# Patient Record
Sex: Female | Born: 1950
Health system: Southern US, Community
[De-identification: ages and names within clinical notes are randomized; demographics above are authoritative.]

## PROBLEM LIST (undated history)

## (undated) DIAGNOSIS — C801 Malignant (primary) neoplasm, unspecified: Secondary | ICD-10-CM

## (undated) DIAGNOSIS — K219 Gastro-esophageal reflux disease without esophagitis: Secondary | ICD-10-CM

## (undated) DIAGNOSIS — E785 Hyperlipidemia, unspecified: Secondary | ICD-10-CM

## (undated) DIAGNOSIS — M8580 Other specified disorders of bone density and structure, unspecified site: Secondary | ICD-10-CM

## (undated) DIAGNOSIS — M810 Age-related osteoporosis without current pathological fracture: Secondary | ICD-10-CM

## (undated) HISTORY — DX: Hyperlipidemia, unspecified: E78.5

## (undated) HISTORY — DX: Other specified disorders of bone density and structure, unspecified site: M85.80

## (undated) HISTORY — PX: MASTECTOMY: SHX3

## (undated) HISTORY — DX: Gastro-esophageal reflux disease without esophagitis: K21.9

## (undated) HISTORY — PX: BREAST SURGERY: SHX581

## (undated) HISTORY — DX: Malignant (primary) neoplasm, unspecified: C80.1

## (undated) HISTORY — DX: Age-related osteoporosis without current pathological fracture: M81.0

## (undated) HISTORY — PX: OTHER SURGICAL HISTORY: SHX169

## (undated) HISTORY — PX: HERNIA REPAIR: SHX51

---

## 2002-09-29 ENCOUNTER — Other Ambulatory Visit: Admission: RE | Admit: 2002-09-29 | Discharge: 2002-09-29 | Payer: Self-pay | Admitting: Obstetrics and Gynecology

## 2003-05-22 ENCOUNTER — Encounter: Admission: RE | Admit: 2003-05-22 | Discharge: 2003-05-22 | Payer: Self-pay | Admitting: Family Medicine

## 2003-05-31 ENCOUNTER — Encounter: Admission: RE | Admit: 2003-05-31 | Discharge: 2003-05-31 | Payer: Self-pay | Admitting: Family Medicine

## 2003-06-07 ENCOUNTER — Encounter: Admission: RE | Admit: 2003-06-07 | Discharge: 2003-06-07 | Payer: Self-pay | Admitting: Family Medicine

## 2003-07-17 ENCOUNTER — Encounter: Admission: RE | Admit: 2003-07-17 | Discharge: 2003-07-17 | Payer: Self-pay | Admitting: Family Medicine

## 2003-10-30 ENCOUNTER — Other Ambulatory Visit: Admission: RE | Admit: 2003-10-30 | Discharge: 2003-10-30 | Payer: Self-pay | Admitting: Obstetrics and Gynecology

## 2004-10-31 ENCOUNTER — Other Ambulatory Visit: Admission: RE | Admit: 2004-10-31 | Discharge: 2004-10-31 | Payer: Self-pay | Admitting: Obstetrics and Gynecology

## 2005-11-04 ENCOUNTER — Ambulatory Visit: Payer: Self-pay | Admitting: Pulmonary Disease

## 2005-11-05 ENCOUNTER — Ambulatory Visit: Admission: RE | Admit: 2005-11-05 | Discharge: 2005-11-05 | Payer: Self-pay | Admitting: Pulmonary Disease

## 2005-12-02 ENCOUNTER — Ambulatory Visit: Payer: Self-pay | Admitting: Pulmonary Disease

## 2006-05-12 ENCOUNTER — Encounter: Admission: RE | Admit: 2006-05-12 | Discharge: 2006-05-12 | Payer: Self-pay | Admitting: Family Medicine

## 2010-02-24 ENCOUNTER — Encounter: Payer: Self-pay | Admitting: Family Medicine

## 2010-06-21 ENCOUNTER — Other Ambulatory Visit: Payer: Self-pay | Admitting: Obstetrics and Gynecology

## 2010-06-21 DIAGNOSIS — Z780 Asymptomatic menopausal state: Secondary | ICD-10-CM

## 2010-09-17 ENCOUNTER — Ambulatory Visit
Admission: RE | Admit: 2010-09-17 | Discharge: 2010-09-17 | Disposition: A | Discharge status: Home / Self Care | Payer: BC Managed Care – PPO | Source: Ambulatory Visit | Attending: Obstetrics and Gynecology | Admitting: Obstetrics and Gynecology

## 2010-09-17 DIAGNOSIS — Z780 Asymptomatic menopausal state: Secondary | ICD-10-CM

## 2011-07-28 ENCOUNTER — Other Ambulatory Visit: Payer: Self-pay | Admitting: Obstetrics and Gynecology

## 2011-07-28 DIAGNOSIS — R928 Other abnormal and inconclusive findings on diagnostic imaging of breast: Secondary | ICD-10-CM

## 2011-07-29 ENCOUNTER — Ambulatory Visit
Admission: RE | Admit: 2011-07-29 | Discharge: 2011-07-29 | Disposition: A | Discharge status: Home / Self Care | Payer: BC Managed Care – PPO | Source: Ambulatory Visit | Attending: Obstetrics and Gynecology | Admitting: Obstetrics and Gynecology

## 2011-07-29 DIAGNOSIS — R928 Other abnormal and inconclusive findings on diagnostic imaging of breast: Secondary | ICD-10-CM

## 2012-07-19 ENCOUNTER — Encounter: Payer: Self-pay | Admitting: Family Medicine

## 2012-07-21 ENCOUNTER — Other Ambulatory Visit: Payer: Self-pay | Admitting: Family Medicine

## 2012-07-21 DIAGNOSIS — Z0000 Encounter for general adult medical examination without abnormal findings: Secondary | ICD-10-CM

## 2012-07-22 ENCOUNTER — Other Ambulatory Visit (INDEPENDENT_AMBULATORY_CARE_PROVIDER_SITE_OTHER): Payer: BC Managed Care – PPO

## 2012-07-22 DIAGNOSIS — Z0000 Encounter for general adult medical examination without abnormal findings: Secondary | ICD-10-CM

## 2012-07-22 DIAGNOSIS — L659 Nonscarring hair loss, unspecified: Secondary | ICD-10-CM

## 2012-07-22 LAB — COMPREHENSIVE METABOLIC PANEL
ALT: 22 U/L (ref 0–35)
Albumin: 4.3 g/dL (ref 3.5–5.2)
Alkaline Phosphatase: 53 U/L (ref 39–117)
CO2: 27 mEq/L (ref 19–32)
Potassium: 5.1 mEq/L (ref 3.5–5.3)
Total Protein: 6.9 g/dL (ref 6.0–8.3)

## 2012-07-22 LAB — FERRITIN: Ferritin: 49 ng/mL (ref 10–291)

## 2012-07-22 LAB — LIPID PANEL
LDL Cholesterol: 121 mg/dL — ABNORMAL HIGH (ref 0–99)
VLDL: 33 mg/dL (ref 0–40)

## 2012-07-23 LAB — CBC WITH DIFFERENTIAL/PLATELET

## 2012-07-23 LAB — VITAMIN D 25 HYDROXY (VIT D DEFICIENCY, FRACTURES): Vit D, 25-Hydroxy: 59 ng/mL (ref 30–89)

## 2012-07-26 ENCOUNTER — Ambulatory Visit (INDEPENDENT_AMBULATORY_CARE_PROVIDER_SITE_OTHER): Payer: BC Managed Care – PPO | Admitting: Family Medicine

## 2012-07-26 ENCOUNTER — Encounter: Payer: Self-pay | Admitting: Family Medicine

## 2012-07-26 VITALS — BP 118/78 | HR 80 | Temp 98.0°F | Resp 16 | Ht 64.5 in | Wt 134.0 lb

## 2012-07-26 DIAGNOSIS — M899 Disorder of bone, unspecified: Secondary | ICD-10-CM

## 2012-07-26 DIAGNOSIS — Z1211 Encounter for screening for malignant neoplasm of colon: Secondary | ICD-10-CM

## 2012-07-26 DIAGNOSIS — M949 Disorder of cartilage, unspecified: Secondary | ICD-10-CM

## 2012-07-26 DIAGNOSIS — Z23 Encounter for immunization: Secondary | ICD-10-CM

## 2012-07-26 DIAGNOSIS — E785 Hyperlipidemia, unspecified: Secondary | ICD-10-CM | POA: Insufficient documentation

## 2012-07-26 DIAGNOSIS — Z2911 Encounter for prophylactic immunotherapy for respiratory syncytial virus (RSV): Secondary | ICD-10-CM

## 2012-07-26 DIAGNOSIS — Z0000 Encounter for general adult medical examination without abnormal findings: Secondary | ICD-10-CM

## 2012-07-26 DIAGNOSIS — M8580 Other specified disorders of bone density and structure, unspecified site: Secondary | ICD-10-CM | POA: Insufficient documentation

## 2012-07-26 LAB — CBC WITH DIFFERENTIAL/PLATELET
Basophils Absolute: 0.1 10*3/uL (ref 0.0–0.1)
Eosinophils Relative: 2 % (ref 0–5)
HCT: 41.1 % (ref 36.0–46.0)
Lymphocytes Relative: 34 % (ref 12–46)
Lymphs Abs: 1.5 10*3/uL (ref 0.7–4.0)
Monocytes Absolute: 0.3 10*3/uL (ref 0.1–1.0)
Monocytes Relative: 8 % (ref 3–12)
Neutro Abs: 2.4 10*3/uL (ref 1.7–7.7)
Neutrophils Relative %: 55 % (ref 43–77)
RDW: 12.6 % (ref 11.5–15.5)
WBC: 4.4 10*3/uL (ref 4.0–10.5)

## 2012-07-26 NOTE — Progress Notes (Signed)
Subjective:    Patient ID: Brittany House, female    DOB: [birth year], 62 y.o.   MRN: [id number]  HPI Patient's here today for complete physical exam.  She denies any complaints. She just saw her gynecologist. Her mammogram has been performed. Her Pap smear has been performed. She is due for her next DEXA scan for her osteopenia. Her last colonoscopy was in 2003 and so she is overdue for her colonoscopy.  Her last tetanus shot was in June 2013 and is up to date. She is due for the shingle shot. Past Medical History  Diagnosis Date  . Cancer     breast  . Hyperlipidemia   . GERD (gastroesophageal reflux disease)   . Osteopenia    No current outpatient prescriptions on file prior to visit.   No current facility-administered medications on file prior to visit.   Allergies  Allergen Reactions  . Penicillins Swelling and Rash   History   Social History  . Marital Status: Married    Spouse Name: N/A    Number of Children: N/A  . Years of Education: N/A   Occupational History  . Not on file.   Social History Main Topics  . Smoking status: Never Smoker   . Smokeless tobacco: Never Used  . Alcohol Use: Yes     Comment: Rare  . Drug Use: No  . Sexually Active: Yes     Comment: married to Casimiro Needle.  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   Other Topics Concern  . Not on file   Social History Narrative  . No narrative on file   Family History  Problem Relation Age of Onset  . Heart disease Neg Hx   . Cancer Neg Hx       Review of Systems  All other systems reviewed and are negative.       Objective:   Physical Exam  Vitals reviewed. Constitutional: She is oriented to person, place, and time. She appears well-developed and well-nourished. No distress.  HENT:  Head: Normocephalic and atraumatic.  Right Ear: External ear normal.  Left Ear: External ear normal.  Nose: Nose normal.  Mouth/Throat: Oropharynx is clear and moist. No oropharyngeal exudate.  Eyes: Conjunctivae and EOM are  normal. Pupils are equal, round, and reactive to light. Right eye exhibits no discharge. Left eye exhibits no discharge. No scleral icterus.  Neck: Normal range of motion. Neck supple. No JVD present. No tracheal deviation present. No thyromegaly present.  Cardiovascular: Normal rate, regular rhythm, normal heart sounds and intact distal pulses.  Exam reveals no gallop and no friction rub.   No murmur heard. Pulmonary/Chest: Effort normal and breath sounds normal. No stridor. No respiratory distress. She has no wheezes. She has no rales. She exhibits no tenderness.  Abdominal: Soft. Bowel sounds are normal. She exhibits no distension and no mass. There is no tenderness. There is no rebound and no guarding.  Musculoskeletal: Normal range of motion. She exhibits no edema and no tenderness.  Lymphadenopathy:    She has no cervical adenopathy.  Neurological: She is alert and oriented to person, place, and time. She has normal reflexes. She displays normal reflexes. No cranial nerve deficit. She exhibits normal muscle tone. Coordination normal.  Skin: Skin is warm and dry. No rash noted. She is not diaphoretic. No erythema. No pallor.  Psychiatric: She has a normal mood and affect. Her behavior is normal. Judgment and thought content normal.          Assessment & Plan:  1.  Osteopenia She is taking vitamin D 500 units per day and calcium 1200 mg per day.  Recheck DEXA scan. - DG Bone Density; Future  2. Routine general medical examination at a health care facility I reviewed all her labs with the patient. These are listed below. Her exam is completely normal. I deferred her pelvic exam, breast exam, Pap smear to her gynecologist. I recommended the shingle shot but the patient will call her insurance prior to receiving it to determine the cost. Lab on 07/22/2012  Component Date Value Range Status  . Cholesterol 07/22/2012 200  0 - 200 mg/dL Final   Comment: ATP III Classification:                                 < 200        mg/dL        Desirable                               200 - 239     mg/dL        Borderline High                               >= 240        mg/dL        High                             . Triglycerides 07/22/2012 166* <150 mg/dL Final  . HDL 81/19/1478 46  >39 mg/dL Final  . Total CHOL/HDL Ratio 07/22/2012 4.3   Final  . VLDL 07/22/2012 33  0 - 40 mg/dL Final  . LDL Cholesterol 07/22/2012 121* 0 - 99 mg/dL Final   Comment:                            Total Cholesterol/HDL Ratio:CHD Risk                                                 Coronary Heart Disease Risk Table                                                                 Men       Women                                   1/2 Average Risk              3.4        3.3                                       Average Risk  5.0        4.4                                    2X Average Risk              9.6        7.1                                    3X Average Risk             23.4       11.0                          Use the calculated Patient Ratio above and the CHD Risk table                           to determine the patient's CHD Risk.                          ATP III Classification (LDL):                                < 100        mg/dL         Optimal                               100 - 129     mg/dL         Near or Above Optimal                               130 - 159     mg/dL         Borderline High                               160 - 189     mg/dL         High                                > 190        mg/dL         Very High                             . WBC 07/22/2012 TEST NOT PERFORMED  4.0 - 10.5 K/uL Final   Specimen clotted, unable to perform test(s).  . RBC 07/22/2012 TEST NOT PERFORMED  3.87 - 5.11 MIL/uL Final  . Hemoglobin 07/22/2012 TEST NOT PERFORMED  12.0 - 15.0 g/dL Final  . HCT 16/11/9602 TEST NOT PERFORMED  36.0 - 46.0 % Final  . MCV 07/22/2012 TEST NOT PERFORMED   78.0 - 100.0 fL Final  . [facility] 07/22/2012 TEST NOT PERFORMED  26.0 - 34.0 pg Final  . MCHC 07/22/2012 TEST NOT PERFORMED  30.0 - 36.0 g/dL Final  . RDW  07/22/2012 TEST NOT PERFORMED  11.5 - 15.5 % Final  . Platelets 07/22/2012 TEST NOT PERFORMED  150 - 400 K/uL Final  . Neutrophils Relative % 07/22/2012 TEST NOT PERFORMED  43 - 77 % Final  . Neutro Abs 07/22/2012 TEST NOT PERFORMED  1.7 - 7.7 K/uL Final  . Lymphocytes Relative 07/22/2012 TEST NOT PERFORMED  12 - 46 % Final  . Lymphs Abs 07/22/2012 TEST NOT PERFORMED  0.7 - 4.0 K/uL Final  . Monocytes Relative 07/22/2012 TEST NOT PERFORMED  3 - 12 % Final  . Monocytes Absolute 07/22/2012 TEST NOT PERFORMED  0.1 - 1.0 K/uL Final  . Eosinophils Relative 07/22/2012 TEST NOT PERFORMED  0 - 5 % Final  . Eosinophils Absolute 07/22/2012 TEST NOT PERFORMED  0.0 - 0.7 K/uL Final  . Basophils Relative 07/22/2012 TEST NOT PERFORMED  0 - 1 % Final  . Basophils Absolute 07/22/2012 TEST NOT PERFORMED  0.0 - 0.1 K/uL Final  . Smear Review 07/22/2012 TEST NOT PERFORMED   Final  . Sodium 07/22/2012 141  135 - 145 mEq/L Final  . Potassium 07/22/2012 5.1  3.5 - 5.3 mEq/L Final  . Chloride 07/22/2012 105  96 - 112 mEq/L Final  . CO2 07/22/2012 27  19 - 32 mEq/L Final  . Glucose, Bld 07/22/2012 87  70 - 99 mg/dL Final  . BUN 16/11/9602 16  6 - 23 mg/dL Final  . Creat 54/10/8117 0.77  0.50 - 1.10 mg/dL Final  . Total Bilirubin 07/22/2012 0.9  0.3 - 1.2 mg/dL Final  . Alkaline Phosphatase 07/22/2012 53  39 - 117 U/L Final  . AST 07/22/2012 19  0 - 37 U/L Final  . ALT 07/22/2012 22  0 - 35 U/L Final  . Total Protein 07/22/2012 6.9  6.0 - 8.3 g/dL Final  . Albumin 14/78/2956 4.3  3.5 - 5.2 g/dL Final  . Calcium 21/30/8657 9.7  8.4 - 10.5 mg/dL Final  . Vit D, 84-ONGEXBM 07/22/2012 59  30 - 89 ng/mL Final   Comment: This assay accurately quantifies Vitamin D, which is the sum of the                          25-Hydroxy forms of Vitamin D2 and D3.  Studies have  shown that the                          optimum concentration of 25-Hydroxy Vitamin D is 30 ng/mL or higher.                           Concentrations of Vitamin D between 20 and 29 ng/mL are considered to                          be insufficient and concentrations less than 20 ng/mL are considered                          to be deficient for Vitamin D.  . TSH 07/22/2012 1.578  0.350 - 4.500 uIU/mL Final  . Ferritin 07/22/2012 49  10 - 291 ng/mL Final  . Iron 07/22/2012 137  42 - 145 ug/dL Final    - CBC with Differential  3. Screen for colon cancer Consult GI for a screening colonoscopy. - Ambulatory referral to Gastroenterology

## 2012-09-01 ENCOUNTER — Telehealth: Payer: Self-pay | Admitting: Family Medicine

## 2012-09-01 DIAGNOSIS — Z1211 Encounter for screening for malignant neoplasm of colon: Secondary | ICD-10-CM

## 2012-09-01 NOTE — Telephone Encounter (Signed)
Change in Provider for Colonoscopy

## 2012-09-06 ENCOUNTER — Other Ambulatory Visit: Payer: Self-pay | Admitting: Obstetrics and Gynecology

## 2012-09-06 DIAGNOSIS — N951 Menopausal and female climacteric states: Secondary | ICD-10-CM

## 2012-09-15 ENCOUNTER — Other Ambulatory Visit: Payer: BC Managed Care – PPO

## 2012-09-22 ENCOUNTER — Ambulatory Visit
Admission: RE | Admit: 2012-09-22 | Discharge: 2012-09-22 | Disposition: A | Discharge status: Home / Self Care | Source: Ambulatory Visit | Attending: Obstetrics and Gynecology | Admitting: Obstetrics and Gynecology

## 2012-09-22 DIAGNOSIS — N951 Menopausal and female climacteric states: Secondary | ICD-10-CM

## 2012-10-15 ENCOUNTER — Other Ambulatory Visit: Payer: Self-pay | Admitting: Obstetrics and Gynecology

## 2012-10-15 DIAGNOSIS — Z9012 Acquired absence of left breast and nipple: Secondary | ICD-10-CM

## 2012-10-15 DIAGNOSIS — N644 Mastodynia: Secondary | ICD-10-CM

## 2012-10-19 ENCOUNTER — Ambulatory Visit
Admission: RE | Admit: 2012-10-19 | Discharge: 2012-10-19 | Disposition: A | Discharge status: Home / Self Care | Payer: BC Managed Care – PPO | Source: Ambulatory Visit | Attending: Obstetrics and Gynecology | Admitting: Obstetrics and Gynecology

## 2012-10-19 DIAGNOSIS — N644 Mastodynia: Secondary | ICD-10-CM

## 2012-10-19 DIAGNOSIS — Z9012 Acquired absence of left breast and nipple: Secondary | ICD-10-CM

## 2012-10-19 LAB — HM MAMMOGRAPHY

## 2012-11-16 ENCOUNTER — Ambulatory Visit (INDEPENDENT_AMBULATORY_CARE_PROVIDER_SITE_OTHER): Payer: BC Managed Care – PPO | Admitting: Family Medicine

## 2012-11-16 DIAGNOSIS — Z23 Encounter for immunization: Secondary | ICD-10-CM

## 2012-11-23 ENCOUNTER — Encounter: Payer: Self-pay | Admitting: Family Medicine

## 2012-11-23 ENCOUNTER — Ambulatory Visit (INDEPENDENT_AMBULATORY_CARE_PROVIDER_SITE_OTHER): Payer: BC Managed Care – PPO | Admitting: Family Medicine

## 2012-11-23 VITALS — BP 118/80 | HR 76 | Temp 98.2°F | Resp 18 | Ht 65.0 in | Wt 136.0 lb

## 2012-11-23 DIAGNOSIS — M810 Age-related osteoporosis without current pathological fracture: Secondary | ICD-10-CM

## 2012-11-23 NOTE — Progress Notes (Signed)
  Subjective:    Patient ID: Brittany House, female    DOB: 1950/04/01, 62 y.o.   MRN: [id number]  HPI Patient recently had a bone density that showed a T score of -2.7 in the lumbar spine and a T score of -2.3 and the left femur. She is recommended to treat osteoporosis. She is here for a second opinion. She's tried Boniva and Actonel in the past but had bone pain related to that. Therefore she is not interested in trying bisphosphonate again. She is never been on prolia.  She does have a history of breast cancer. Past Medical History  Diagnosis Date  . Cancer     breast  . Hyperlipidemia   . GERD (gastroesophageal reflux disease)   . Osteopenia   . Osteoporosis    No current outpatient prescriptions on file prior to visit.   No current facility-administered medications on file prior to visit.   Allergies  Allergen Reactions  . Penicillins Swelling and Rash   History   Social History  . Marital Status: Married    Spouse Name: N/A    Number of Children: N/A  . Years of Education: N/A   Occupational History  . Not on file.   Social History Main Topics  . Smoking status: Never Smoker   . Smokeless tobacco: Never Used  . Alcohol Use: Yes     Comment: Rare  . Drug Use: No  . Sexual Activity: Yes     Comment: married to Casimiro Needle.  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   Other Topics Concern  . Not on file   Social History Narrative  . No narrative on file      Review of Systems  All other systems reviewed and are negative.       Objective:   Physical Exam  Vitals reviewed. Cardiovascular: Normal rate, regular rhythm, normal heart sounds and intact distal pulses.   No murmur heard. Pulmonary/Chest: Effort normal and breath sounds normal. No respiratory distress. She has no wheezes. She has no rales.          Assessment & Plan:  1. Osteoporosis, unspecified Given her history of breast cancer, I think Evista 60 mg by mouth daily is an excellent choice for her osteoporosis. I will  recheck a bone density test in 2 years. If continuing to decline we could consider prolia injections.  I did discuss the risk of CVA on Evista and patient understands the risk and will continue with therapy.   Both external auditory canals and tympanic membranes appear normal today. Her symptoms are most consistent with eustachian tube dysfunction. She reports occasionally her ear still clogged like there's a pressure behind. There is no evidence of infection today or an effusion.Marland Kitchen

## 2013-03-15 ENCOUNTER — Ambulatory Visit (INDEPENDENT_AMBULATORY_CARE_PROVIDER_SITE_OTHER): Payer: BC Managed Care – PPO | Admitting: Family Medicine

## 2013-03-15 ENCOUNTER — Encounter: Payer: Self-pay | Admitting: Family Medicine

## 2013-03-15 VITALS — BP 104/68 | HR 80 | Temp 97.4°F | Resp 16 | Ht 64.5 in | Wt 137.0 lb

## 2013-03-15 DIAGNOSIS — J019 Acute sinusitis, unspecified: Secondary | ICD-10-CM

## 2013-03-15 MED ORDER — LEVOFLOXACIN 750 MG PO TABS: 750.0000 mg | ORAL_TABLET | Freq: Every day | ORAL | Status: DC

## 2013-03-15 MED ORDER — GUAIFENESIN-CODEINE 100-10 MG/5ML PO SYRP: 5.0000 mL | ORAL_SOLUTION | Freq: Three times a day (TID) | ORAL | Status: DC | PRN

## 2013-03-15 NOTE — Progress Notes (Signed)
Subjective:    Patient ID: Brittany House, female    DOB: 02-16-1950, 63 y.o.   MRN: [id number]  HPI One week ago the patient developed a severe sore throat and high fever. I was on call that weekend and I called the patient out a Z-Pak because she was concerned she may have been exposed to strep throat. This resolve after 2 days. The fever resolved after 2 days. However the patient has had constant postnasal drip ever since and severe hacking nonproductive cough. Her biggest concern today is postnasal drip and constant nonproductive cough that she describes as a tickle in her throat. Past Medical History  Diagnosis Date  . Cancer     breast  . Hyperlipidemia   . GERD (gastroesophageal reflux disease)   . Osteopenia   . Osteoporosis    Current Outpatient Prescriptions on File Prior to Visit  Medication Sig Dispense Refill  . Biotin 2500 MCG CAPS Take 1 tablet by mouth daily.      . calcium carbonate (OS-CAL) 600 MG TABS tablet Take 600 mg by mouth 2 (two) times daily with a meal.      . Multiple Vitamin (MULTIVITAMIN) tablet Take 1 tablet by mouth daily.      Marland Kitchen OVER THE COUNTER MEDICATION Take 1 tablet by mouth daily. Vitamin D 500iu      . raloxifene (EVISTA) 60 MG tablet Take 1 tablet by mouth daily.       No current facility-administered medications on file prior to visit.   Allergies  Allergen Reactions  . Penicillins Swelling and Rash   History   Social History  . Marital Status: Married    Spouse Name: N/A    Number of Children: N/A  . Years of Education: N/A   Occupational History  . Not on file.   Social History Main Topics  . Smoking status: Never Smoker   . Smokeless tobacco: Never Used  . Alcohol Use: Yes     Comment: Rare  . Drug Use: No  . 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   Other Topics Concern  . Not on file   Social History Narrative  . No narrative on file      Review of Systems  All other systems  reviewed and are negative.       Objective:   Physical Exam  Vitals reviewed. Constitutional: She appears well-developed and well-nourished.  HENT:  Right Ear: Tympanic membrane, external ear and ear canal normal.  Left Ear: Tympanic membrane, external ear and ear canal normal.  Nose: Mucosal edema and rhinorrhea present. Right sinus exhibits no maxillary sinus tenderness and no frontal sinus tenderness. Left sinus exhibits no maxillary sinus tenderness and no frontal sinus tenderness.  Mouth/Throat: Oropharynx is clear and moist. No oropharyngeal exudate.  Neck: Neck supple.  Cardiovascular: Normal rate, regular rhythm and normal heart sounds.   No murmur heard. Pulmonary/Chest: Effort normal and breath sounds normal. No respiratory distress. She has no wheezes. She has no rales.  Lymphadenopathy:    She has no cervical adenopathy.   5 mm well-circumscribed yellow cyst on the right peritonsillar fold which is chronic and unchanged        Assessment & Plan:  1. Acute rhinosinusitis I believe her cough is due to postnasal drip from a viral upper respiratory tract infection/sinusitis. I do not believe this is bacterial sinusitis. I gave the patient prescription for Levaquin but instructed her not to  fill the prescription unless she develops severe pain in her sinuses or high fever. I recommended Sudafed as needed for congestion. Recommended Claritin as needed for congestion. I also gave the patient Robitussin-AC as needed for cough. - levofloxacin (LEVAQUIN) 750 MG tablet; Take 1 tablet (750 mg total) by mouth daily.  Dispense: 7 tablet; Refill: 0 - guaiFENesin-codeine (ROBITUSSIN AC) 100-10 MG/5ML syrup; Take 5 mLs by mouth 3 (three) times daily as needed for cough.  Dispense: 120 mL; Refill: 0

## 2013-04-21 ENCOUNTER — Ambulatory Visit (INDEPENDENT_AMBULATORY_CARE_PROVIDER_SITE_OTHER): Payer: BC Managed Care – PPO | Admitting: Family Medicine

## 2013-04-21 ENCOUNTER — Encounter: Payer: Self-pay | Admitting: Family Medicine

## 2013-04-21 ENCOUNTER — Other Ambulatory Visit: Payer: Self-pay | Admitting: Family Medicine

## 2013-04-21 VITALS — BP 110/70 | HR 78 | Temp 98.4°F | Resp 14 | Ht 64.5 in | Wt 137.0 lb

## 2013-04-21 DIAGNOSIS — R059 Cough, unspecified: Secondary | ICD-10-CM

## 2013-04-21 DIAGNOSIS — R05 Cough: Secondary | ICD-10-CM

## 2013-04-21 DIAGNOSIS — K219 Gastro-esophageal reflux disease without esophagitis: Secondary | ICD-10-CM

## 2013-04-21 DIAGNOSIS — J019 Acute sinusitis, unspecified: Secondary | ICD-10-CM

## 2013-04-21 MED ORDER — PANTOPRAZOLE SODIUM 40 MG PO TBEC: 40.0000 mg | DELAYED_RELEASE_TABLET | Freq: Every day | ORAL | Status: DC

## 2013-04-21 MED ORDER — GUAIFENESIN-CODEINE 100-10 MG/5ML PO SYRP: 5.0000 mL | ORAL_SOLUTION | Freq: Three times a day (TID) | ORAL | Status: DC | PRN

## 2013-04-21 NOTE — Telephone Encounter (Signed)
Refill appropriate and filled per protocol. 

## 2013-04-21 NOTE — Progress Notes (Signed)
   Subjective:    Patient ID: Brittany House, female    DOB: July 27, 1950, 63 y.o.   MRN: [id number]  HPI  Patient continues to have a problem with an intermittent cough. The cough is nonproductive. She reports daily postnasal drip and rhinorrhea and allergies. She is also reporting daily reflux. She has tried and failed Zantac, Pepcid, and Zegerid over-the-counter without relief. She has tried prescription Nexium in the past but that did not work very well for her either. She is here today for evaluation. She denies any fevers chills hemoptysis weight loss or night sweats. Past Medical History  Diagnosis Date  . Cancer     breast  . Hyperlipidemia   . GERD (gastroesophageal reflux disease)   . Osteopenia   . Osteoporosis    Current Outpatient Prescriptions on File Prior to Visit  Medication Sig Dispense Refill  . Biotin 2500 MCG CAPS Take 1 tablet by mouth daily.      . calcium carbonate (OS-CAL) 600 MG TABS tablet Take 600 mg by mouth 2 (two) times daily with a meal.      . Multiple Vitamin (MULTIVITAMIN) tablet Take 1 tablet by mouth daily.      Marland Kitchen OVER THE COUNTER MEDICATION Take 1 tablet by mouth daily. Vitamin D 500iu      . raloxifene (EVISTA) 60 MG tablet Take 1 tablet by mouth daily.       No current facility-administered medications on file prior to visit.   Allergies  Allergen Reactions  . Penicillins Swelling and Rash   History   Social History  . Marital Status: Married    Spouse Name: N/A    Number of Children: N/A  . Years of Education: N/A   Occupational History  . Not on file.   Social History Main Topics  . Smoking status: Never Smoker   . Smokeless tobacco: Never Used  . Alcohol Use: Yes     Comment: Rare  . Drug Use: No  . 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   Other Topics Concern  . Not on file   Social History Narrative  . No narrative on file     Review of Systems  All other systems reviewed and are  negative.       Objective:   Physical Exam  Vitals reviewed. Constitutional: She appears well-developed and well-nourished.  HENT:  Right Ear: External ear normal.  Left Ear: External ear normal.  Nose: Nose normal.  Mouth/Throat: Oropharynx is clear and moist. No oropharyngeal exudate.  Eyes: Conjunctivae are normal. No scleral icterus.  Neck: Neck supple. No JVD present. No thyromegaly present.  Cardiovascular: Normal rate, regular rhythm and normal heart sounds.  Exam reveals no gallop.   No murmur heard. Pulmonary/Chest: Effort normal and breath sounds normal. No respiratory distress. She has no wheezes. She has no rales. She exhibits no tenderness.  Lymphadenopathy:    She has no cervical adenopathy.          Assessment & Plan:  GERD (gastroesophageal reflux disease) - Plan: pantoprazole (PROTONIX) 40 MG tablet  Acute rhinosinusitis - Plan: guaiFENesin-codeine (ROBITUSSIN AC) 100-10 MG/5ML syrup  Cough  I believe the patient has upper airway cough syndrome due to a variety of factors. I will have her start Veramyst 2 sprays each nostril daily, Claritin 10 mg by mouth daily, and protonix 40 mg by mouth daily. Recheck cough in 3 weeks

## 2013-04-25 ENCOUNTER — Telehealth: Payer: Self-pay | Admitting: Family Medicine

## 2013-04-25 MED ORDER — LEVOFLOXACIN 500 MG PO TABS: 500.0000 mg | ORAL_TABLET | Freq: Every day | ORAL | Status: DC

## 2013-04-25 NOTE — Telephone Encounter (Signed)
Sounds like sinus infection, and began Levaquin 500 mg by mouth daily for 7 days.

## 2013-04-25 NOTE — Telephone Encounter (Signed)
Patient aware and med sent to pharm 

## 2013-04-25 NOTE — Telephone Encounter (Signed)
Call back number is [phone] Pt states that her cough is still broken, and now it has all went to her face and it is hurting Her acid reflux is better  Pharmacy [facility] --before calling anything in please call pt

## 2013-04-27 ENCOUNTER — Telehealth: Payer: Self-pay | Admitting: Family Medicine

## 2013-04-27 NOTE — Telephone Encounter (Signed)
PA submitted through [email or web address] / [email or web address]

## 2013-05-02 ENCOUNTER — Telehealth: Payer: Self-pay | Admitting: Family Medicine

## 2013-05-02 MED ORDER — PREDNISONE 20 MG PO TABS: ORAL_TABLET | ORAL | Status: DC

## 2013-05-02 NOTE — Telephone Encounter (Signed)
Patient aware and med sent to pharm 

## 2013-05-02 NOTE — Telephone Encounter (Signed)
Message copied by Alyson Locket on Mon May 02, 2013 10:35 AM ------      Message from: Lenore Manner      Created: Mon May 02, 2013  8:37 AM      Regarding: not completly better      Contact: [phone]       Pt is getting way better, but the Clairtin is not helping.             What is something else she can use for decongestion, she still has broken cough some not like it was and her sinus are still dripping.  ------

## 2013-05-02 NOTE — Telephone Encounter (Signed)
Begin prednisone taper pack.

## 2013-05-02 NOTE — Telephone Encounter (Signed)
What else would you recommend for her?

## 2013-07-21 ENCOUNTER — Other Ambulatory Visit: Payer: BC Managed Care – PPO

## 2013-07-21 DIAGNOSIS — E785 Hyperlipidemia, unspecified: Secondary | ICD-10-CM

## 2013-07-21 DIAGNOSIS — Z0000 Encounter for general adult medical examination without abnormal findings: Secondary | ICD-10-CM

## 2013-07-21 LAB — LIPID PANEL
Cholesterol: 181 mg/dL (ref 0–200)
HDL: 54 mg/dL (ref >39)
LDL CALC: 100 mg/dL — AB (ref 0–99)
TRIGLYCERIDES: 136 mg/dL (ref <150)
Total CHOL/HDL Ratio: 3.4 Ratio
VLDL: 27 mg/dL (ref 0–40)

## 2013-07-21 LAB — COMPLETE METABOLIC PANEL WITH GFR
ALT: 21 U/L (ref 0–35)
AST: 19 U/L (ref 0–37)
Albumin: 4.1 g/dL (ref 3.5–5.2)
Alkaline Phosphatase: 55 U/L (ref 39–117)
BUN: 18 mg/dL (ref 6–23)
CO2: 26 mEq/L (ref 19–32)
CREATININE: 0.74 mg/dL (ref 0.50–1.10)
Calcium: 9 mg/dL (ref 8.4–10.5)
Chloride: 106 mEq/L (ref 96–112)
GFR, Est African American: >89 mL/min
GFR, Est Non African American: 88 mL/min
Glucose, Bld: 91 mg/dL (ref 70–99)
POTASSIUM: 4.6 meq/L (ref 3.5–5.3)
Sodium: 141 mEq/L (ref 135–145)
Total Bilirubin: 0.7 mg/dL (ref 0.2–1.2)
Total Protein: 6.2 g/dL (ref 6.0–8.3)

## 2013-07-21 LAB — CBC WITH DIFFERENTIAL/PLATELET
Basophils Absolute: 0 10*3/uL (ref 0.0–0.1)
Basophils Relative: 1 % (ref 0–1)
Eosinophils Absolute: 0.2 10*3/uL (ref 0.0–0.7)
Eosinophils Relative: 4 % (ref 0–5)
HCT: 40.9 % (ref 36.0–46.0)
Hemoglobin: 13.8 g/dL (ref 12.0–15.0)
LYMPHS PCT: 38 % (ref 12–46)
Lymphs Abs: 1.7 10*3/uL (ref 0.7–4.0)
MCH: 31.3 pg (ref 26.0–34.0)
MCHC: 33.7 g/dL (ref 30.0–36.0)
MCV: 92.7 fL (ref 78.0–100.0)
Monocytes Absolute: 0.4 10*3/uL (ref 0.1–1.0)
Monocytes Relative: 8 % (ref 3–12)
NEUTROS ABS: 2.3 10*3/uL (ref 1.7–7.7)
NEUTROS PCT: 49 % (ref 43–77)
PLATELETS: 216 10*3/uL (ref 150–400)
RBC: 4.41 MIL/uL (ref 3.87–5.11)
RDW: 12.9 % (ref 11.5–15.5)
WBC: 4.6 10*3/uL (ref 4.0–10.5)

## 2013-07-21 LAB — TSH: TSH: 2.549 u[IU]/mL (ref 0.350–4.500)

## 2013-07-21 NOTE — Telephone Encounter (Signed)
Spoke to pt and she states that she never got it filled as she did not need it and that she has [employer] primary so if she needs it she will call and we will redo a PA to [facility]

## 2013-07-28 ENCOUNTER — Encounter: Payer: Self-pay | Admitting: Family Medicine

## 2013-07-28 ENCOUNTER — Ambulatory Visit (INDEPENDENT_AMBULATORY_CARE_PROVIDER_SITE_OTHER): Payer: BC Managed Care – PPO | Admitting: Family Medicine

## 2013-07-28 VITALS — BP 100/62 | HR 78 | Temp 97.3°F | Resp 16 | Ht 64.5 in | Wt 136.0 lb

## 2013-07-28 DIAGNOSIS — Z0000 Encounter for general adult medical examination without abnormal findings: Secondary | ICD-10-CM

## 2013-07-28 MED ORDER — SCOPOLAMINE 1 MG/3DAYS TD PT72: 1.0000 | MEDICATED_PATCH | TRANSDERMAL | Status: DC

## 2013-07-28 NOTE — Progress Notes (Signed)
Subjective:    Patient ID: Brittany House, female    DOB: Oct 19, 1950, 63 y.o.   MRN: [id number]  HPI Patient's here today for complete physical exam. She sees a gynecologist to perform her Pap smear as well as her mammogram earlier this year. She has not received results of that yet. Her colonoscopy was performed last year. She had one polyp. Her next colonoscopy is due in 4 years (q 5 years).  Her bone density is not due until next year. She does have osteoporosis treated with Evista. She has an intolerance to bisphosphonates.  Patient has no underlying medical concerns. Her immunizations are up-to-date. Tetanus shot was in 2013. She has had Zostavax.  She is not due for pneumonia vaccines until age 74. Lab on 07/21/2013  Component Date Value Ref Range Status  . WBC 07/21/2013 4.6  4.0 - 10.5 K/uL Final  . RBC 07/21/2013 4.41  3.87 - 5.11 MIL/uL Final  . Hemoglobin 07/21/2013 13.8  12.0 - 15.0 g/dL Final  . HCT 07/21/2013 40.9  36.0 - 46.0 % Final  . MCV 07/21/2013 92.7  78.0 - 100.0 fL Final  . MCH 07/21/2013 31.3  26.0 - 34.0 pg Final  . MCHC 07/21/2013 33.7  30.0 - 36.0 g/dL Final  . RDW 07/21/2013 12.9  11.5 - 15.5 % Final  . Platelets 07/21/2013 216  150 - 400 K/uL Final  . Neutrophils Relative % 07/21/2013 49  43 - 77 % Final  . Neutro Abs 07/21/2013 2.3  1.7 - 7.7 K/uL Final  . Lymphocytes Relative 07/21/2013 38  12 - 46 % Final  . Lymphs Abs 07/21/2013 1.7  0.7 - 4.0 K/uL Final  . Monocytes Relative 07/21/2013 8  3 - 12 % Final  . Monocytes Absolute 07/21/2013 0.4  0.1 - 1.0 K/uL Final  . Eosinophils Relative 07/21/2013 4  0 - 5 % Final  . Eosinophils Absolute 07/21/2013 0.2  0.0 - 0.7 K/uL Final  . Basophils Relative 07/21/2013 1  0 - 1 % Final  . Basophils Absolute 07/21/2013 0.0  0.0 - 0.1 K/uL Final  . Smear Review 07/21/2013 Criteria for review not met   Final  . TSH 07/21/2013 2.549  0.350 - 4.500 uIU/mL Final  . Sodium 07/21/2013 141  135 - 145 mEq/L Final  . Potassium  07/21/2013 4.6  3.5 - 5.3 mEq/L Final  . Chloride 07/21/2013 106  96 - 112 mEq/L Final  . CO2 07/21/2013 26  19 - 32 mEq/L Final  . Glucose, Bld 07/21/2013 91  70 - 99 mg/dL Final  . BUN 07/21/2013 18  6 - 23 mg/dL Final  . Creat 07/21/2013 0.74  0.50 - 1.10 mg/dL Final  . Total Bilirubin 07/21/2013 0.7  0.2 - 1.2 mg/dL Final  . Alkaline Phosphatase 07/21/2013 55  39 - 117 U/L Final  . AST 07/21/2013 19  0 - 37 U/L Final  . ALT 07/21/2013 21  0 - 35 U/L Final  . Total Protein 07/21/2013 6.2  6.0 - 8.3 g/dL Final  . Albumin 07/21/2013 4.1  3.5 - 5.2 g/dL Final  . Calcium 07/21/2013 9.0  8.4 - 10.5 mg/dL Final  . GFR, Est African American 07/21/2013 >89   Final  . GFR, Est Non African American 07/21/2013 88   Final   Comment:                            The estimated GFR is  a calculation valid for adults (>=71 years old)                          that uses the CKD-EPI algorithm to adjust for age and sex. It is                            not to be used for children, pregnant women, hospitalized patients,                             patients on dialysis, or with rapidly changing kidney function.                          According to the NKDEP, eGFR >89 is normal, 60-89 shows mild                          impairment, 30-59 shows moderate impairment, 15-29 shows severe                          impairment and <15 is ESRD.                             Marland Kitchen Cholesterol 07/21/2013 181  0 - 200 mg/dL Final   Comment: ATP III Classification:                                < 200        mg/dL        Desirable                               200 - 239     mg/dL        Borderline High                               >= 240        mg/dL        High                             . Triglycerides 07/21/2013 136  <150 mg/dL Final  . HDL 07/21/2013 54  >39 mg/dL Final  . Total CHOL/HDL Ratio 07/21/2013 3.4   Final  . VLDL 07/21/2013 27  0 - 40 mg/dL Final  . LDL Cholesterol 07/21/2013 100* 0 - 99 mg/dL Final    Comment:                            Total Cholesterol/HDL Ratio:CHD Risk                                                 Coronary Heart Disease Risk Table  Men       Women                                   1/2 Average Risk              3.4        3.3                                       Average Risk              5.0        4.4                                    2X Average Risk              9.6        7.1                                    3X Average Risk             23.4       11.0                          Use the calculated Patient Ratio above and the CHD Risk table                           to determine the patient's CHD Risk.                          ATP III Classification (LDL):                                < 100        mg/dL         Optimal                               100 - 129     mg/dL         Near or Above Optimal                               130 - 159     mg/dL         Borderline High                               160 - 189     mg/dL         High                                > 190        mg/dL         Very High  Past Medical History  Diagnosis Date  . Cancer     breast  . Hyperlipidemia   . GERD (gastroesophageal reflux disease)   . Osteopenia   . Osteoporosis    No past surgical history on file. Current Outpatient Prescriptions on File Prior to Visit  Medication Sig Dispense Refill  . Biotin 2500 MCG CAPS Take 1 tablet by mouth daily.      . calcium carbonate (OS-CAL) 600 MG TABS tablet Take 600 mg by mouth 2 (two) times daily with a meal.      . Multiple Vitamin (MULTIVITAMIN) tablet Take 1 tablet by mouth daily.      Marland Kitchen OVER THE COUNTER MEDICATION Take 1 tablet by mouth daily. Vitamin D 500iu      . pantoprazole (PROTONIX) 40 MG tablet Take 1 tablet (40 mg total) by mouth daily.  30 tablet  3  . raloxifene (EVISTA) 60 MG tablet Take 1 tablet by mouth daily.       No  current facility-administered medications on file prior to visit.   Allergies  Allergen Reactions  . Penicillins Swelling and Rash   History   Social History  . Marital Status: Married    Spouse Name: N/A    Number of Children: N/A  . Years of Education: N/A   Occupational History  . Not on file.   Social History Main Topics  . Smoking status: Never Smoker   . Smokeless tobacco: Never Used  . Alcohol Use: Yes     Comment: Rare  . Drug Use: No  . 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   Other Topics Concern  . Not on file   Social History Narrative  . No narrative on file   Family History  Problem Relation Age of Onset  . Heart disease Neg Hx   . Cancer Neg Hx       Review of Systems  All other systems reviewed and are negative.      Objective:   Physical Exam  Vitals reviewed. Constitutional: She is oriented to person, place, and time. She appears well-developed and well-nourished. No distress.  HENT:  Head: Normocephalic and atraumatic.  Right Ear: External ear normal.  Left Ear: External ear normal.  Nose: Nose normal.  Mouth/Throat: Oropharynx is clear and moist. No oropharyngeal exudate.  Eyes: Conjunctivae and EOM are normal. Pupils are equal, round, and reactive to light. Right eye exhibits no discharge. Left eye exhibits no discharge. No scleral icterus.  Neck: Normal range of motion. Neck supple. No JVD present. No tracheal deviation present. No thyromegaly present.  Cardiovascular: Normal rate, regular rhythm, normal heart sounds and intact distal pulses.  Exam reveals no gallop and no friction rub.   No murmur heard. Pulmonary/Chest: Effort normal and breath sounds normal. No stridor. No respiratory distress. She has no wheezes. She has no rales. She exhibits no tenderness.  Abdominal: Soft. Bowel sounds are normal. She exhibits no distension and no mass. There is no tenderness. There is no rebound and no guarding.    Musculoskeletal: Normal range of motion. She exhibits no edema and no tenderness.  Lymphadenopathy:    She has no cervical adenopathy.  Neurological: She is alert and oriented to person, place, and time. She has normal reflexes. She displays normal reflexes. No cranial nerve deficit. She exhibits normal muscle tone. Coordination normal.  Skin: Skin is warm. No rash noted. She is not diaphoretic. No erythema. No pallor.  Psychiatric: She has a normal mood and affect. Her behavior is normal. Judgment and thought content normal.          Assessment & Plan:  Routine general medical examination at a health care facility  physical exam is completely normal. Immunizations are up to date. Cancer screening is up to date. Patient's lab work is excellent. Regular anticipatory guidance was provided. Patient will be due for a bone density next year. She'll be due for colonoscopy in 2019.

## 2013-08-04 ENCOUNTER — Telehealth: Payer: Self-pay | Admitting: *Deleted

## 2013-08-04 NOTE — Telephone Encounter (Signed)
Received fax from pharmacy requesting PA for Pantoprazole.   PA submitted.

## 2013-08-08 NOTE — Telephone Encounter (Signed)
Received request for more information for PA.   Information faxed.

## 2013-08-09 NOTE — Telephone Encounter (Signed)
Received PA determination.   PA approved 07/10/2013- 08/09/2014.

## 2013-08-25 ENCOUNTER — Encounter: Payer: Self-pay | Admitting: Family Medicine

## 2013-08-25 ENCOUNTER — Ambulatory Visit (INDEPENDENT_AMBULATORY_CARE_PROVIDER_SITE_OTHER): Payer: BC Managed Care – PPO | Admitting: Family Medicine

## 2013-08-25 VITALS — BP 100/66 | HR 76 | Temp 98.5°F | Resp 14 | Ht 64.5 in | Wt 136.0 lb

## 2013-08-25 DIAGNOSIS — K219 Gastro-esophageal reflux disease without esophagitis: Secondary | ICD-10-CM

## 2013-08-25 NOTE — Progress Notes (Signed)
   Subjective:    Patient ID: Brittany House, female    DOB: 05-03-1950, 63 y.o.   MRN: [id number]  HPI Patient has a history of GERD for last 3 months.  Initially protonix 40 mg poqday helped but now she is having breakthrough indigestion on a daily basis. At times it is extremely severe. She has tried Nexium on her iron without much benefit. She denies any melena or hematochezia. She has tried eating a bland diet. She has not tried elevating the head of her bed. She has not been screen for Helicobacter pylori. Past Medical History  Diagnosis Date  . Cancer     breast  . Hyperlipidemia   . GERD (gastroesophageal reflux disease)   . Osteopenia   . Osteoporosis    No past surgical history on file. Current Outpatient Prescriptions on File Prior to Visit  Medication Sig Dispense Refill  . Biotin 2500 MCG CAPS Take 1 tablet by mouth daily.      . calcium carbonate (OS-CAL) 600 MG TABS tablet Take 600 mg by mouth 2 (two) times daily with a meal.      . Multiple Vitamin (MULTIVITAMIN) tablet Take 1 tablet by mouth daily.      Marland Kitchen OVER THE COUNTER MEDICATION Take 1 tablet by mouth daily. Vitamin D 500iu      . raloxifene (EVISTA) 60 MG tablet Take 1 tablet by mouth daily.      Marland Kitchen scopolamine (TRANSDERM-SCOP) 1 MG/3DAYS Place 1 patch (1.5 mg total) onto the skin every 3 (three) days.  10 patch  12  . pantoprazole (PROTONIX) 40 MG tablet Take 1 tablet (40 mg total) by mouth daily.  30 tablet  3   No current facility-administered medications on file prior to visit.   Allergies  Allergen Reactions  . Penicillins Swelling and Rash   History   Social History  . Marital Status: Married    Spouse Name: N/A    Number of Children: N/A  . Years of Education: N/A   Occupational History  . Not on file.   Social History Main Topics  . Smoking status: Never Smoker   . Smokeless tobacco: Never Used  . Alcohol Use: Yes     Comment: Rare  . Drug Use: No  . 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   Other Topics Concern  . Not on file   Social History Narrative  . No narrative on file      Review of Systems  All other systems reviewed and are negative.      Objective:   Physical Exam  Vitals reviewed. Cardiovascular: Normal rate, regular rhythm and normal heart sounds.   No murmur heard. Pulmonary/Chest: Effort normal and breath sounds normal. No respiratory distress. She has no wheezes. She has no rales.  Abdominal: Soft. Bowel sounds are normal. She exhibits no distension. There is no tenderness. There is no rebound and no guarding.  Musculoskeletal: She exhibits no edema.          Assessment & Plan:  1. Gastroesophageal reflux disease without esophagitis Discontinue protonix and replace with dexilant 60 mg poqday.  Had Zantac 150 mg by mouth each bedtime. Elevate the head of her bed 2 inches. Continue a bland diet. Screen the patient for Helicobacter pylori. If symptoms are no better in 2-3 weeks, I recommend GI consult for EGD. - Helicobacter pylori abs-IgG+IgA, bld

## 2013-08-29 LAB — HELICOBACTER PYLORI ABS-IGG+IGA, BLD
H Pylori IgG: 0.49 {ISR}
HELICOBACTER PYLORI AB, IGA: 2.5 U/mL (ref <9.0)

## 2013-09-09 ENCOUNTER — Telehealth: Payer: Self-pay | Admitting: Family Medicine

## 2013-09-09 MED ORDER — DEXLANSOPRAZOLE 60 MG PO CPDR: 60.0000 mg | DELAYED_RELEASE_CAPSULE | Freq: Every day | ORAL | Status: DC

## 2013-09-09 NOTE — Telephone Encounter (Signed)
[phone]  Pt was put on dexalant for her acid reflux and it has worked very well and she is about out of samples and she is wanting to know what she needs to do next

## 2013-09-09 NOTE — Telephone Encounter (Signed)
I would anticipate she will need to continue dexilant for several months.

## 2013-09-09 NOTE — Telephone Encounter (Signed)
Call placed to patient. LMTRC.  

## 2013-09-09 NOTE — Telephone Encounter (Signed)
Return call placed to patient.   Reports that she is doing much better on Dexilant.   Prescription sent to pharmacy.   Requested information on if medication will be long term or as needed.   Advised that PPI's can be used as prophylactic so that reflux is controlled.   Requested MD to advise.

## 2013-09-10 NOTE — Telephone Encounter (Signed)
Received fax from pharmacy requesting PA for [place].   PA submitted.   PA approved 08/11/2013- 09/10/2014.  Case ID: [id number]- medication Case ID: [id number]- quantity limit  Call placed to patient and patient made aware of PA and MD recommendations.

## 2013-09-10 NOTE — Telephone Encounter (Signed)
Received fax requesting PA.   PA submitted.

## 2013-11-02 ENCOUNTER — Telehealth: Payer: Self-pay | Admitting: Family Medicine

## 2013-11-02 MED ORDER — FLUTICASONE FUROATE 27.5 MCG/SPRAY NA SUSP: 2.0000 | Freq: Every day | NASAL | Status: DC

## 2013-11-02 NOTE — Telephone Encounter (Signed)
Refill appropriate and filled per protocol. 

## 2013-11-02 NOTE — Telephone Encounter (Signed)
[phone] [facility] [place]  Pt is needing a refill on Veramyst 27.5 mCG Nasal Spray

## 2013-11-07 ENCOUNTER — Telehealth: Payer: Self-pay | Admitting: *Deleted

## 2013-11-07 NOTE — Telephone Encounter (Signed)
Received fax requesting PA on Veramyst.   PA submitted.

## 2013-11-08 NOTE — Telephone Encounter (Signed)
Received PA determination.   PA approved 10/09/2013- 11/08/2014.  Case ID: [id number].

## 2013-12-02 ENCOUNTER — Ambulatory Visit (INDEPENDENT_AMBULATORY_CARE_PROVIDER_SITE_OTHER): Payer: BC Managed Care – PPO | Admitting: Family Medicine

## 2013-12-02 DIAGNOSIS — Z23 Encounter for immunization: Secondary | ICD-10-CM

## 2014-01-10 ENCOUNTER — Ambulatory Visit (INDEPENDENT_AMBULATORY_CARE_PROVIDER_SITE_OTHER): Payer: BC Managed Care – PPO | Admitting: Family Medicine

## 2014-01-10 VITALS — BP 110/84 | HR 76 | Temp 98.1°F | Resp 18 | Wt 138.0 lb

## 2014-01-10 DIAGNOSIS — H9202 Otalgia, left ear: Secondary | ICD-10-CM

## 2014-01-10 DIAGNOSIS — M810 Age-related osteoporosis without current pathological fracture: Secondary | ICD-10-CM

## 2014-01-10 DIAGNOSIS — K219 Gastro-esophageal reflux disease without esophagitis: Secondary | ICD-10-CM

## 2014-01-10 NOTE — Progress Notes (Signed)
Subjective:    Patient ID: Brittany House, female    DOB: 09-26-1950, 63 y.o.   MRN: [id number]  HPI  Patient presents with one-week of dull pain in her left ear. On examination today the left external auditory canal appears completely normal. There is no exudate. There is no swelling. There is no erythema. The left tympanic membrane is pearly gray. There is no effusion or erythema. Examination of the throat reveals no erythema. The patient does have some mild sinus congestion which may be causing her ear discomfort. She also has a history of TMJ which may be contributing to her ear pain. She also has a history of gastroesophageal reflux disease. She is currently taking dexilant 60 mg every day. If she does not take the medication she has breakthrough symptoms rapidly. She would like to see a gastroenterologist for second opinion. She also has a history of osteoporosis. She cannot tolerate the Evista because it is giving her arthralgias and bone pain. She stopped the medication on 2 separate occasions and the pain resolved and as she started the medication back the pain returned. She's had previous symptoms when she took bisphosphonates in the past including Boniva and Fosamax. She is interested in Prolia. Past Medical History  Diagnosis Date  . Cancer     breast  . Hyperlipidemia   . GERD (gastroesophageal reflux disease)   . Osteopenia   . Osteoporosis    No past surgical history on file. Current Outpatient Prescriptions on File Prior to Visit  Medication Sig Dispense Refill  . Biotin 2500 MCG CAPS Take 1 tablet by mouth daily.    Marland Kitchen dexlansoprazole (DEXILANT) 60 MG capsule Take 1 capsule (60 mg total) by mouth daily. 30 capsule 11  . fluticasone (VERAMYST) 27.5 MCG/SPRAY nasal spray Place 2 sprays into the nose daily. 10 g 12  . OVER THE COUNTER MEDICATION Take 1 tablet by mouth daily. Vitamin D 500iu    . calcium carbonate (OS-CAL) 600 MG TABS tablet Take 600 mg by mouth 2 (two) times daily  with a meal.    . Multiple Vitamin (MULTIVITAMIN) tablet Take 1 tablet by mouth daily.    . raloxifene (EVISTA) 60 MG tablet Take 1 tablet by mouth daily.    Marland Kitchen scopolamine (TRANSDERM-SCOP) 1 MG/3DAYS Place 1 patch (1.5 mg total) onto the skin every 3 (three) days. (Patient not taking: Reported on 01/10/2014) 10 patch 12   No current facility-administered medications on file prior to visit.   Allergies  Allergen Reactions  . Penicillins Swelling and Rash   History   Social History  . Marital Status: Married    Spouse Name: N/A    Number of Children: N/A  . Years of Education: N/A   Occupational History  . Not on file.   Social History Main Topics  . Smoking status: Never Smoker   . Smokeless tobacco: Never Used  . Alcohol Use: Yes     Comment: Rare  . Drug Use: No  . 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   Other Topics Concern  . Not on file   Social History Narrative  . No narrative on file   Family History  Problem Relation Age of Onset  . Heart disease Neg Hx   . Cancer Neg Hx      Review of Systems  All other systems reviewed and are negative.      Objective:   Physical Exam  Constitutional: She  appears well-developed and well-nourished. No distress.  HENT:  Right Ear: External ear normal.  Left Ear: External ear normal.  Nose: Nose normal.  Mouth/Throat: Oropharynx is clear and moist. No oropharyngeal exudate.  Neck: Neck supple. No JVD present.  Cardiovascular: Normal rate, regular rhythm and normal heart sounds.   No murmur heard. Pulmonary/Chest: Effort normal and breath sounds normal. No respiratory distress. She has no wheezes. She has no rales.  Lymphadenopathy:    She has no cervical adenopathy.  Skin: She is not diaphoretic.  Vitals reviewed.         Assessment & Plan:  Otalgia of left ear  Gastroesophageal reflux disease without esophagitis - Plan: Ambulatory referral to  Gastroenterology  Osteoporosis  I believe the patient's otalgia is due to a combination of eustachian tube dysfunction and possibly TMJ. I recommended Sudafed and ibuprofen and recheck in one week if no better. I will schedule the patient see her gastroenterologist for possible EGD. I will also get patient information on Prolia

## 2014-01-16 ENCOUNTER — Telehealth: Payer: Self-pay | Admitting: Family Medicine

## 2014-01-16 NOTE — Telephone Encounter (Signed)
Answered patient question about Prolia.  Mailing patient information and gave her web site. She will review and get back to [place] as to whether she wants or not.

## 2014-01-16 NOTE — Telephone Encounter (Signed)
-----   Message from Susy Frizzle, MD sent at 01/10/2014  9:27 AM EST ----- Patient interested in [place].  WANTS INFO.

## 2014-04-07 ENCOUNTER — Encounter: Payer: Self-pay | Admitting: Family Medicine

## 2014-07-27 ENCOUNTER — Other Ambulatory Visit: Payer: BC Managed Care – PPO

## 2014-07-27 ENCOUNTER — Encounter: Payer: Self-pay | Admitting: *Deleted

## 2014-07-27 DIAGNOSIS — M8580 Other specified disorders of bone density and structure, unspecified site: Secondary | ICD-10-CM

## 2014-07-27 DIAGNOSIS — Z79899 Other long term (current) drug therapy: Secondary | ICD-10-CM

## 2014-07-27 DIAGNOSIS — I10 Essential (primary) hypertension: Secondary | ICD-10-CM

## 2014-07-27 DIAGNOSIS — E785 Hyperlipidemia, unspecified: Secondary | ICD-10-CM

## 2014-07-27 LAB — CBC WITH DIFFERENTIAL/PLATELET
Basophils Absolute: 0 10*3/uL (ref 0.0–0.1)
Basophils Relative: 0 % (ref 0–1)
Eosinophils Absolute: 0.1 10*3/uL (ref 0.0–0.7)
Eosinophils Relative: 2 % (ref 0–5)
HCT: 43.8 % (ref 36.0–46.0)
HEMOGLOBIN: 14.6 g/dL (ref 12.0–15.0)
LYMPHS ABS: 1.7 10*3/uL (ref 0.7–4.0)
Lymphocytes Relative: 37 % (ref 12–46)
MCH: 31.3 pg (ref 26.0–34.0)
MCHC: 33.3 g/dL (ref 30.0–36.0)
MCV: 94 fL (ref 78.0–100.0)
MONOS PCT: 8 % (ref 3–12)
MPV: 10.5 fL (ref 8.6–12.4)
Monocytes Absolute: 0.4 10*3/uL (ref 0.1–1.0)
Neutro Abs: 2.5 10*3/uL (ref 1.7–7.7)
Neutrophils Relative %: 53 % (ref 43–77)
Platelets: 235 10*3/uL (ref 150–400)
RBC: 4.66 MIL/uL (ref 3.87–5.11)
RDW: 13.3 % (ref 11.5–15.5)
WBC: 4.7 10*3/uL (ref 4.0–10.5)

## 2014-07-27 LAB — LIPID PANEL
CHOL/HDL RATIO: 4.2 ratio
CHOLESTEROL: 218 mg/dL — AB (ref 0–200)
HDL: 52 mg/dL (ref >=46)
LDL CALC: 132 mg/dL — AB (ref 0–99)
Triglycerides: 170 mg/dL — ABNORMAL HIGH (ref <150)
VLDL: 34 mg/dL (ref 0–40)

## 2014-07-27 LAB — COMPLETE METABOLIC PANEL WITH GFR
ALT: 29 U/L (ref 0–35)
AST: 23 U/L (ref 0–37)
Albumin: 4.2 g/dL (ref 3.5–5.2)
Alkaline Phosphatase: 63 U/L (ref 39–117)
BUN: 15 mg/dL (ref 6–23)
CHLORIDE: 106 meq/L (ref 96–112)
CO2: 26 meq/L (ref 19–32)
Calcium: 9.5 mg/dL (ref 8.4–10.5)
Creat: 0.76 mg/dL (ref 0.50–1.10)
GFR, EST NON AFRICAN AMERICAN: 84 mL/min
GLUCOSE: 81 mg/dL (ref 70–99)
Potassium: 4.5 mEq/L (ref 3.5–5.3)
SODIUM: 146 meq/L — AB (ref 135–145)
Total Bilirubin: 0.8 mg/dL (ref 0.2–1.2)
Total Protein: 6.8 g/dL (ref 6.0–8.3)

## 2014-07-27 LAB — TSH: TSH: 2.661 u[IU]/mL (ref 0.350–4.500)

## 2014-07-28 LAB — VITAMIN D 25 HYDROXY (VIT D DEFICIENCY, FRACTURES): Vit D, 25-Hydroxy: 33 ng/mL (ref 30–100)

## 2014-07-31 ENCOUNTER — Encounter: Payer: BC Managed Care – PPO | Admitting: Family Medicine

## 2014-08-08 ENCOUNTER — Ambulatory Visit (INDEPENDENT_AMBULATORY_CARE_PROVIDER_SITE_OTHER): Payer: BC Managed Care – PPO | Admitting: Family Medicine

## 2014-08-08 ENCOUNTER — Encounter: Payer: Self-pay | Admitting: Family Medicine

## 2014-08-08 VITALS — BP 92/58 | HR 78 | Temp 97.7°F | Resp 12 | Ht 64.5 in | Wt 136.0 lb

## 2014-08-08 DIAGNOSIS — Z0000 Encounter for general adult medical examination without abnormal findings: Secondary | ICD-10-CM

## 2014-08-08 DIAGNOSIS — M810 Age-related osteoporosis without current pathological fracture: Secondary | ICD-10-CM

## 2014-08-08 DIAGNOSIS — G8929 Other chronic pain: Secondary | ICD-10-CM

## 2014-08-08 DIAGNOSIS — R1084 Generalized abdominal pain: Secondary | ICD-10-CM

## 2014-08-08 NOTE — Progress Notes (Signed)
Subjective:    Patient ID: Brittany House, female    DOB: [birth year], 64 y.o.   MRN: [id number]  HPI Patient is here today for complete physical exam. She continues to complain of occasional generalized abdominal discomfort that is crampy and colicky in nature. She's had a colonoscopy and an evaluation by gastroenterology that was normal. She is also saw her GYN and has had a Pap smear as well as a D&C which were normal. Working diagnosis is irritable bowel syndrome. She denies any diarrhea or constipation. Mammogram is scheduled for tomorrow. Colonoscopy and Pap smear up-to-date. She is due for a bone density. Her gynecologist had her discontinue Evista. Audit on 07/27/2014  Component Date Value Ref Range Status  . HM Mammogram 10/19/2012 external result report   Final  Lab on 07/27/2014  Component Date Value Ref Range Status  . Sodium 07/27/2014 146* 135 - 145 mEq/L Final  . Potassium 07/27/2014 4.5  3.5 - 5.3 mEq/L Final  . Chloride 07/27/2014 106  96 - 112 mEq/L Final  . CO2 07/27/2014 26  19 - 32 mEq/L Final  . Glucose, Bld 07/27/2014 81  70 - 99 mg/dL Final  . BUN 07/27/2014 15  6 - 23 mg/dL Final  . Creat 07/27/2014 0.76  0.50 - 1.10 mg/dL Final  . Total Bilirubin 07/27/2014 0.8  0.2 - 1.2 mg/dL Final  . Alkaline Phosphatase 07/27/2014 63  39 - 117 U/L Final  . AST 07/27/2014 23  0 - 37 U/L Final  . ALT 07/27/2014 29  0 - 35 U/L Final  . Total Protein 07/27/2014 6.8  6.0 - 8.3 g/dL Final  . Albumin 07/27/2014 4.2  3.5 - 5.2 g/dL Final  . Calcium 07/27/2014 9.5  8.4 - 10.5 mg/dL Final  . GFR, Est African American 07/27/2014 >89   Final  . GFR, Est Non African American 07/27/2014 84   Final   Comment:   The estimated GFR is a calculation valid for adults (>=26 years old) that uses the CKD-EPI algorithm to adjust for age and sex. It is   not to be used for children, pregnant women, hospitalized patients,    patients on dialysis, or with rapidly changing kidney function. According  to the NKDEP, eGFR >89 is normal, 60-89 shows mild impairment, 30-59 shows moderate impairment, 15-29 shows severe impairment and <15 is ESRD.     . TSH 07/27/2014 2.661  0.350 - 4.500 uIU/mL Final  . Cholesterol 07/27/2014 218* 0 - 200 mg/dL Final   Comment: ATP III Classification:       < 200        mg/dL        Desirable      200 - 239     mg/dL        Borderline High      >= 240        mg/dL        High     . Triglycerides 07/27/2014 170* <150 mg/dL Final  . HDL 07/27/2014 52  >=46 mg/dL Final  . Total CHOL/HDL Ratio 07/27/2014 4.2   Final  . VLDL 07/27/2014 34  0 - 40 mg/dL Final  . LDL Cholesterol 07/27/2014 132* 0 - 99 mg/dL Final   Comment:   Total Cholesterol/HDL Ratio:CHD Risk                        Coronary Heart Disease Risk Table  Men       Women          1/2 Average Risk              3.4        3.3              Average Risk              5.0        4.4           2X Average Risk              9.6        7.1           3X Average Risk             23.4       11.0 Use the calculated Patient Ratio above and the CHD Risk table  to determine the patient's CHD Risk. ATP III Classification (LDL):       < 100        mg/dL         Optimal      100 - 129     mg/dL         Near or Above Optimal      130 - 159     mg/dL         Borderline High      160 - 189     mg/dL         High       > 190        mg/dL         Very High     . WBC 07/27/2014 4.7  4.0 - 10.5 K/uL Final  . RBC 07/27/2014 4.66  3.87 - 5.11 MIL/uL Final  . Hemoglobin 07/27/2014 14.6  12.0 - 15.0 g/dL Final  . HCT 07/27/2014 43.8  36.0 - 46.0 % Final  . MCV 07/27/2014 94.0  78.0 - 100.0 fL Final  . MCH 07/27/2014 31.3  26.0 - 34.0 pg Final  . MCHC 07/27/2014 33.3  30.0 - 36.0 g/dL Final  . RDW 07/27/2014 13.3  11.5 - 15.5 % Final  . Platelets 07/27/2014 235  150 - 400 K/uL Final  . MPV 07/27/2014 10.5  8.6 - 12.4 fL Final  . Neutrophils Relative % 07/27/2014 53  43 - 77 %  Final  . Neutro Abs 07/27/2014 2.5  1.7 - 7.7 K/uL Final  . Lymphocytes Relative 07/27/2014 37  12 - 46 % Final  . Lymphs Abs 07/27/2014 1.7  0.7 - 4.0 K/uL Final  . Monocytes Relative 07/27/2014 8  3 - 12 % Final  . Monocytes Absolute 07/27/2014 0.4  0.1 - 1.0 K/uL Final  . Eosinophils Relative 07/27/2014 2  0 - 5 % Final  . Eosinophils Absolute 07/27/2014 0.1  0.0 - 0.7 K/uL Final  . Basophils Relative 07/27/2014 0  0 - 1 % Final  . Basophils Absolute 07/27/2014 0.0  0.0 - 0.1 K/uL Final  . Smear Review 07/27/2014 Criteria for review not met   Final  . Vit D, 25-Hydroxy 07/27/2014 33  30 - 100 ng/mL Final   Comment: Vitamin D Status           25-OH Vitamin D        Deficiency                <20 ng/mL  Insufficiency         20 - 29 ng/mL        Optimal             > or = 30 ng/mL   For 25-OH Vitamin D testing on patients on D2-supplementation and patients for whom quantitation of D2 and D3 fractions is required, the QuestAssureD 25-OH VIT D, (D2,D3), LC/MS/MS is recommended: order code [id number] (patients > 2 yrs).    Past Medical History  Diagnosis Date  . Cancer     breast  . Hyperlipidemia   . GERD (gastroesophageal reflux disease)   . Osteopenia   . Osteoporosis    No past surgical history on file. Current Outpatient Prescriptions on File Prior to Visit  Medication Sig Dispense Refill  . calcium carbonate (OS-CAL) 600 MG TABS tablet Take 600 mg by mouth 2 (two) times daily with a meal.    . fluticasone (VERAMYST) 27.5 MCG/SPRAY nasal spray Place 2 sprays into the nose daily. 10 g 12  . Multiple Vitamin (MULTIVITAMIN) tablet Take 1 tablet by mouth daily.    Marland Kitchen OVER THE COUNTER MEDICATION Take 1 tablet by mouth daily. Vitamin D 500iu     No current facility-administered medications on file prior to visit.   Allergies  Allergen Reactions  . Penicillins Swelling and Rash   History   Social History  . Marital Status: Married    Spouse Name: N/A  . Number of  Children: N/A  . Years of Education: N/A   Occupational History  . Not on file.   Social History Main Topics  . Smoking status: Never Smoker   . Smokeless tobacco: Never Used  . Alcohol Use: Yes     Comment: Rare  . Drug Use: No  . 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   Other Topics Concern  . Not on file   Social History Narrative   Family History  Problem Relation Age of Onset  . Heart disease Neg Hx   . Cancer Neg Hx       Review of Systems  All other systems reviewed and are negative.      Objective:   Physical Exam  Constitutional: She is oriented to person, place, and time. She appears well-developed and well-nourished. No distress.  HENT:  Head: Normocephalic and atraumatic.  Right Ear: External ear normal.  Left Ear: External ear normal.  Nose: Nose normal.  Mouth/Throat: Oropharynx is clear and moist. No oropharyngeal exudate.  Eyes: Conjunctivae and EOM are normal. Pupils are equal, round, and reactive to light. Right eye exhibits no discharge. Left eye exhibits no discharge. No scleral icterus.  Neck: Normal range of motion. Neck supple. No JVD present. No thyromegaly present.  Cardiovascular: Normal rate, regular rhythm, normal heart sounds and intact distal pulses.  Exam reveals no gallop and no friction rub.   No murmur heard. Pulmonary/Chest: Effort normal and breath sounds normal. No stridor. No respiratory distress. She has no wheezes. She has no rales. She exhibits no tenderness.  Abdominal: Soft. Bowel sounds are normal. She exhibits no distension and no mass. There is no tenderness. There is no rebound and no guarding.  Musculoskeletal: Normal range of motion. She exhibits no edema or tenderness.  Lymphadenopathy:    She has no cervical adenopathy.  Neurological: She is alert and oriented to person, place, and time. She has normal reflexes. She displays normal reflexes. No cranial nerve deficit. She exhibits  normal muscle tone. Coordination normal.  Skin: Skin is warm. No rash noted. She is not diaphoretic. No erythema. No pallor.  Psychiatric: She has a normal mood and affect. Her behavior is normal. Judgment and thought content normal.  Vitals reviewed.         Assessment & Plan:  Chronic generalized abdominal pain - Plan: Celiac panel 10  Osteoporosis - Plan: DG Bone Density  Routine general medical examination at a health care facility  Patient's physical exam is normal. She would like to work on diet and exercise try to reduce her cholesterol and then recheck her cholesterol in 6 months. I will schedule the patient for a bone density and of her osteoporosis has worsened, I will schedule the patient for pearly injections. Her mammogram is being performed tomorrow. Her colonoscopy and Pap smear up-to-date. I will check the patient for celiac disease. However I believe the patient most likely has irritable bowel syndrome. I will treat this with a combination of probiotics and Zantac

## 2014-08-09 LAB — CELIAC PANEL 10
ENDOMYSIAL SCREEN: NEGATIVE
Gliadin IgA: 8 Units (ref <20)
Gliadin IgG: 2 Units (ref <20)
IgA: 135 mg/dL (ref 69–380)
TISSUE TRANSGLUTAMINASE AB, IGA: 1 U/mL (ref <4)
Tissue Transglut Ab: 1 U/mL (ref <6)

## 2014-09-25 ENCOUNTER — Other Ambulatory Visit: Payer: BC Managed Care – PPO

## 2014-09-29 ENCOUNTER — Ambulatory Visit
Admission: RE | Admit: 2014-09-29 | Discharge: 2014-09-29 | Disposition: A | Discharge status: Home / Self Care | Payer: BC Managed Care – PPO | Source: Ambulatory Visit | Attending: Family Medicine | Admitting: Family Medicine

## 2014-09-29 DIAGNOSIS — M810 Age-related osteoporosis without current pathological fracture: Secondary | ICD-10-CM

## 2014-09-29 LAB — HM DEXA SCAN

## 2014-10-04 ENCOUNTER — Encounter: Payer: Self-pay | Admitting: Family Medicine

## 2014-10-13 ENCOUNTER — Ambulatory Visit (INDEPENDENT_AMBULATORY_CARE_PROVIDER_SITE_OTHER): Payer: BC Managed Care – PPO | Admitting: Family Medicine

## 2014-10-13 ENCOUNTER — Encounter: Payer: Self-pay | Admitting: Family Medicine

## 2014-10-13 VITALS — BP 100/62 | HR 72 | Temp 97.8°F | Resp 16 | Ht 64.5 in | Wt 136.0 lb

## 2014-10-13 DIAGNOSIS — S030XXA Dislocation of jaw, initial encounter: Secondary | ICD-10-CM | POA: Diagnosis not present

## 2014-10-13 DIAGNOSIS — M810 Age-related osteoporosis without current pathological fracture: Secondary | ICD-10-CM | POA: Diagnosis not present

## 2014-10-13 DIAGNOSIS — S0300XA Dislocation of jaw, unspecified side, initial encounter: Secondary | ICD-10-CM

## 2014-10-13 MED ORDER — DIAZEPAM 10 MG PO TABS: 10.0000 mg | ORAL_TABLET | Freq: Two times a day (BID) | ORAL | Status: DC | PRN

## 2014-10-13 MED ORDER — ALENDRONATE SODIUM 70 MG PO TABS: 70.0000 mg | ORAL_TABLET | ORAL | Status: DC

## 2014-10-13 NOTE — Progress Notes (Signed)
   Subjective:    Patient ID: Brittany House, female    DOB: October 23, 1950, 64 y.o.   MRN: [id number]  HPI Patient is having pain in her left TMJ joint. It is clicking and grinding. It is aching and throbbing constantly. This is been going on several weeks. Recently on a bone density test, she was found to have a T score of -2.5 in the left hip. However she is very afraid of taking Fosamax due to potential gastrointestinal side effects. The patient has uncontrolled acid reflux and she is worried that this will exacerbate it. Past Medical History  Diagnosis Date  . Cancer     breast  . Hyperlipidemia   . GERD (gastroesophageal reflux disease)   . Osteopenia   . Osteoporosis    No past surgical history on file. Current Outpatient Prescriptions on File Prior to Visit  Medication Sig Dispense Refill  . calcium carbonate (OS-CAL) 600 MG TABS tablet Take 600 mg by mouth 2 (two) times daily with a meal.    . fluticasone (VERAMYST) 27.5 MCG/SPRAY nasal spray Place 2 sprays into the nose daily. 10 g 12  . Multiple Vitamin (MULTIVITAMIN) tablet Take 1 tablet by mouth daily.    Marland Kitchen OVER THE COUNTER MEDICATION Take 1 tablet by mouth daily. Vitamin D 1000 IU     No current facility-administered medications on file prior to visit.   Allergies  Allergen Reactions  . Penicillins Swelling and Rash   Social History   Social History  . Marital Status: Married    Spouse Name: N/A  . Number of Children: N/A  . Years of Education: N/A   Occupational History  . Not on file.   Social History Main Topics  . Smoking status: Never Smoker   . Smokeless tobacco: Never Used  . Alcohol Use: Yes     Comment: Rare  . Drug Use: No  . 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   Other Topics Concern  . Not on file   Social History Narrative      Review of Systems  All other systems reviewed and are negative.      Objective:   Physical Exam  Constitutional: She appears  well-developed and well-nourished.  HENT:  Right Ear: External ear normal.  Left Ear: External ear normal.  Nose: Nose normal.  Mouth/Throat: Oropharynx is clear and moist. No oropharyngeal exudate.  Cardiovascular: Normal rate, regular rhythm and normal heart sounds.   Pulmonary/Chest: Effort normal and breath sounds normal.          Assessment & Plan:  TMJ (dislocation of temporomandibular joint), initial encounter - Plan: diazepam (VALIUM) 10 MG tablet  Osteoporosis - Plan: alendronate (FOSAMAX) 70 MG tablet  Begin Valium 10 mg by mouth daily at bedtime as a muscle relaxer. There are mouthguard at night. Use ibuprofen as an anti-inflammatory although she will need to monitor for exacerbation of her GERD. Stop ibuprofen after 1 week. Once the patient has treated her TMJ, I would like her to try Fosamax. If it irritates her stomach, I will try her on prolia.

## 2014-10-16 ENCOUNTER — Encounter: Payer: Self-pay | Admitting: Family Medicine

## 2014-11-10 ENCOUNTER — Telehealth: Payer: Self-pay | Admitting: Family Medicine

## 2014-11-10 MED ORDER — FLUTICASONE PROPIONATE 50 MCG/ACT NA SUSP: 2.0000 | Freq: Every day | NASAL | Status: DC

## 2014-11-10 NOTE — Telephone Encounter (Signed)
New Rx to pharmacy

## 2014-11-10 NOTE — Telephone Encounter (Signed)
Try fluticasone 2 puff inhaled each nostril qday.

## 2014-11-10 NOTE — Telephone Encounter (Signed)
PA for Veramyst thru [id number].  Must have tried and failed Flunisolide 0.025% or Fluticasone  16 gm  Pleased advise?

## 2014-11-14 ENCOUNTER — Other Ambulatory Visit (HOSPITAL_COMMUNITY): Payer: Self-pay | Admitting: Orthodontics and Dentofacial Orthopedics

## 2014-11-14 DIAGNOSIS — S0300XS Dislocation of jaw, unspecified side, sequela: Secondary | ICD-10-CM

## 2014-11-20 ENCOUNTER — Telehealth: Payer: Self-pay | Admitting: Family Medicine

## 2014-11-20 DIAGNOSIS — S0300XA Dislocation of jaw, unspecified side, initial encounter: Secondary | ICD-10-CM

## 2014-11-20 NOTE — Telephone Encounter (Signed)
Brittany House from dr Juliane Lack office  calling to speak with you regarding med refill on her valium would like to discuss this with you  [phone]

## 2014-11-21 MED ORDER — DIAZEPAM 10 MG PO TABS: 10.0000 mg | ORAL_TABLET | Freq: Two times a day (BID) | ORAL | Status: DC | PRN

## 2014-11-21 NOTE — Telephone Encounter (Signed)
?   OK to Refill  - Called and spoke to Isola at Dr. Gwenyth Ober office and she can not RX valium and pt would like to get a refill - she is scheduled for a MRI of her jaw for her TMJ pain.

## 2014-11-21 NOTE — Telephone Encounter (Signed)
SUre, I am fine rx'ing valium.

## 2014-11-21 NOTE — Telephone Encounter (Signed)
Med called to pharm 

## 2014-11-23 ENCOUNTER — Ambulatory Visit (HOSPITAL_COMMUNITY)
Admission: RE | Admit: 2014-11-23 | Discharge: 2014-11-23 | Disposition: A | Discharge status: Home / Self Care | Payer: BC Managed Care – PPO | Source: Ambulatory Visit | Attending: Orthodontics and Dentofacial Orthopedics | Admitting: Orthodontics and Dentofacial Orthopedics

## 2014-11-23 DIAGNOSIS — M26603 Bilateral temporomandibular joint disorder, unspecified: Secondary | ICD-10-CM | POA: Diagnosis not present

## 2014-11-23 DIAGNOSIS — S0300XS Dislocation of jaw, unspecified side, sequela: Secondary | ICD-10-CM | POA: Insufficient documentation

## 2014-11-28 ENCOUNTER — Ambulatory Visit (INDEPENDENT_AMBULATORY_CARE_PROVIDER_SITE_OTHER): Payer: BC Managed Care – PPO | Admitting: Family Medicine

## 2014-11-28 ENCOUNTER — Encounter: Payer: Self-pay | Admitting: Family Medicine

## 2014-11-28 VITALS — BP 100/64 | HR 74 | Temp 98.2°F | Resp 14 | Ht 64.5 in | Wt 136.0 lb

## 2014-11-28 DIAGNOSIS — D485 Neoplasm of uncertain behavior of skin: Secondary | ICD-10-CM

## 2014-11-28 NOTE — Progress Notes (Signed)
   Subjective:    Patient ID: Brittany House, female    DOB: Feb 10, 1950, 65 y.o.   MRN: [id number]  HPI Patient has noticed a mass under the skin on the lateral aspect of her upper left thigh 2 days ago. It is about the size of a pea, 0.5 cm in diameter.  It is freely mobile underneath the skin. It is not attached or adherent to the underlying quadriceps muscle. It is slightly tender although the patient admits to having "mashed it" frequently over the last 2 days checking it.  It is smooth and round. It is soft in texture almost cystlike. Past Medical History  Diagnosis Date  . Cancer ([place])     breast  . Hyperlipidemia   . GERD (gastroesophageal reflux disease)   . Osteopenia   . Osteoporosis    No past surgical history on file. Current Outpatient Prescriptions on File Prior to Visit  Medication Sig Dispense Refill  . alendronate (FOSAMAX) 70 MG tablet Take 1 tablet (70 mg total) by mouth every 7 (seven) days. Take with a full glass of water on an empty stomach. 4 tablet 11  . calcium carbonate (OS-CAL) 600 MG TABS tablet Take 600 mg by mouth 2 (two) times daily with a meal.    . diazepam (VALIUM) 10 MG tablet Take 1 tablet (10 mg total) by mouth every 12 (twelve) hours as needed (muscle spasm). 30 tablet 2  . Multiple Vitamin (MULTIVITAMIN) tablet Take 1 tablet by mouth daily.    Marland Kitchen OVER THE COUNTER MEDICATION Take 1 tablet by mouth daily. Vitamin D 1000 IU    . fluticasone (FLONASE) 50 MCG/ACT nasal spray Place 2 sprays into both nostrils daily. (Patient not taking: Reported on 11/28/2014) 16 g 6   No current facility-administered medications on file prior to visit.   Allergies  Allergen Reactions  . Penicillins Swelling and Rash   Social History   Social History  . Marital Status: Married    Spouse Name: N/A  . Number of Children: N/A  . Years of Education: N/A   Occupational History  . Not on file.   Social History Main Topics  . Smoking status: Never Smoker   . Smokeless  tobacco: Never Used  . Alcohol Use: Yes     Comment: Rare  . Drug Use: No  . 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   Other Topics Concern  . Not on file   Social History Narrative      Review of Systems  All other systems reviewed and are negative.      Objective:   Physical Exam  Constitutional: She appears well-developed and well-nourished.  Cardiovascular: Normal rate, regular rhythm and normal heart sounds.   Pulmonary/Chest: Effort normal and breath sounds normal.  Vitals reviewed.  please see the description in the history of present illness        Assessment & Plan:  Neoplasm of uncertain behavior of skin  I am unable to predict what this is today.  I do believe it is a benign growth similar to a cyst. We discussed clinical monitoring versus an excisional biopsy. The patient elects to monitor this clinically over the next 4-6 weeks. If it becomes more tender, or grows, she will proceed with an excisional biopsy

## 2014-12-11 ENCOUNTER — Other Ambulatory Visit: Payer: Self-pay | Admitting: Family Medicine

## 2014-12-11 DIAGNOSIS — M810 Age-related osteoporosis without current pathological fracture: Secondary | ICD-10-CM

## 2014-12-11 MED ORDER — ALENDRONATE SODIUM 70 MG PO TABS: 70.0000 mg | ORAL_TABLET | ORAL | Status: DC

## 2014-12-11 NOTE — Telephone Encounter (Signed)
PHARMACY: [facility]   MEDICATION: FOSAMAX   QTY:    SIG:    PHYSICIAN: PICKARD   PT. PHONE #: [phone]  *PT REQUESTS A MONTH RX RATHER THAN A WEEKLY RX

## 2014-12-11 NOTE — Telephone Encounter (Signed)
New scrpt sent to pharmacy

## 2014-12-26 ENCOUNTER — Ambulatory Visit (INDEPENDENT_AMBULATORY_CARE_PROVIDER_SITE_OTHER): Payer: BC Managed Care – PPO | Admitting: Family Medicine

## 2014-12-26 DIAGNOSIS — Z23 Encounter for immunization: Secondary | ICD-10-CM | POA: Diagnosis not present

## 2015-01-03 ENCOUNTER — Other Ambulatory Visit: Payer: Self-pay | Admitting: Family Medicine

## 2015-01-03 DIAGNOSIS — M810 Age-related osteoporosis without current pathological fracture: Secondary | ICD-10-CM

## 2015-01-03 MED ORDER — ALENDRONATE SODIUM 70 MG PO TABS: 70.0000 mg | ORAL_TABLET | ORAL | Status: DC

## 2015-01-19 ENCOUNTER — Telehealth: Payer: Self-pay | Admitting: Family Medicine

## 2015-01-19 MED ORDER — DIAZEPAM 5 MG PO TABS: 5.0000 mg | ORAL_TABLET | Freq: Two times a day (BID) | ORAL | Status: DC | PRN

## 2015-01-19 NOTE — Telephone Encounter (Signed)
Patient is calling to say that her diazepam is too strong please call her regarding this   [phone]

## 2015-01-19 NOTE — Telephone Encounter (Signed)
Pt states that the 10mg  valium is too strong for her and she wants to know if she can get a lower dosage. Per [facility] can do 5 mg and she can cut in half to take prn. Pt aware and med called to pharm

## 2015-07-31 ENCOUNTER — Other Ambulatory Visit: Payer: BC Managed Care – PPO

## 2015-07-31 DIAGNOSIS — Z0000 Encounter for general adult medical examination without abnormal findings: Secondary | ICD-10-CM

## 2015-07-31 LAB — CBC WITH DIFFERENTIAL/PLATELET
BASOS ABS: 0 {cells}/uL (ref 0–200)
Basophils Relative: 0 %
EOS PCT: 4 %
Eosinophils Absolute: 196 cells/uL (ref 15–500)
HCT: 41.2 % (ref 35.0–45.0)
HEMOGLOBIN: 14 g/dL (ref 12.0–15.0)
LYMPHS ABS: 1666 {cells}/uL (ref 850–3900)
Lymphocytes Relative: 34 %
MCH: 31.3 pg (ref 27.0–33.0)
MCHC: 34 g/dL (ref 32.0–36.0)
MCV: 92.2 fL (ref 80.0–100.0)
MONOS PCT: 10 %
MPV: 10.2 fL (ref 7.5–12.5)
Monocytes Absolute: 490 cells/uL (ref 200–950)
NEUTROS ABS: 2548 {cells}/uL (ref 1500–7800)
Neutrophils Relative %: 52 %
PLATELETS: 237 10*3/uL (ref 140–400)
RBC: 4.47 MIL/uL (ref 3.80–5.10)
RDW: 13.3 % (ref 11.0–15.0)
WBC: 4.9 10*3/uL (ref 3.8–10.8)

## 2015-07-31 LAB — COMPREHENSIVE METABOLIC PANEL
ALT: 21 U/L (ref 6–29)
AST: 19 U/L (ref 10–35)
Albumin: 4 g/dL (ref 3.6–5.1)
Alkaline Phosphatase: 40 U/L (ref 33–130)
BILIRUBIN TOTAL: 0.8 mg/dL (ref 0.2–1.2)
BUN: 14 mg/dL (ref 7–25)
CHLORIDE: 106 mmol/L (ref 98–110)
CO2: 22 mmol/L (ref 20–31)
CREATININE: 0.7 mg/dL (ref 0.50–0.99)
Calcium: 8.9 mg/dL (ref 8.6–10.4)
Glucose, Bld: 92 mg/dL (ref 70–99)
Potassium: 4.2 mmol/L (ref 3.5–5.3)
SODIUM: 138 mmol/L (ref 135–146)
Total Protein: 6.1 g/dL (ref 6.1–8.1)

## 2015-07-31 LAB — LIPID PANEL
Cholesterol: 209 mg/dL — ABNORMAL HIGH (ref 125–200)
HDL: 57 mg/dL (ref >=46)
LDL CALC: 121 mg/dL (ref <130)
Total CHOL/HDL Ratio: 3.7 Ratio (ref <=5.0)
Triglycerides: 156 mg/dL — ABNORMAL HIGH (ref <150)
VLDL: 31 mg/dL — AB (ref <30)

## 2015-07-31 LAB — TSH: TSH: 4.39 m[IU]/L

## 2015-08-01 LAB — VITAMIN D 25 HYDROXY (VIT D DEFICIENCY, FRACTURES): VIT D 25 HYDROXY: 34 ng/mL (ref 30–100)

## 2015-08-10 ENCOUNTER — Encounter: Payer: Self-pay | Admitting: Family Medicine

## 2015-08-10 ENCOUNTER — Ambulatory Visit (INDEPENDENT_AMBULATORY_CARE_PROVIDER_SITE_OTHER): Payer: BC Managed Care – PPO | Admitting: Family Medicine

## 2015-08-10 VITALS — BP 110/62 | HR 64 | Temp 98.1°F | Resp 16 | Ht 64.5 in | Wt 133.0 lb

## 2015-08-10 DIAGNOSIS — Z0000 Encounter for general adult medical examination without abnormal findings: Secondary | ICD-10-CM

## 2015-08-10 DIAGNOSIS — K219 Gastro-esophageal reflux disease without esophagitis: Secondary | ICD-10-CM | POA: Diagnosis not present

## 2015-08-10 NOTE — Progress Notes (Signed)
Subjective:    Patient ID: Brittany House, female    DOB: 27-Apr-1950, 66 y.o.   MRN: [id number]  HPI Patient is here today for complete physical exam. Her mammogram and her Pap smear are performed at her gynecologist. Her bone density was performed in 2016 and was significant for osteoporosis for which she takes Fosamax. Her colonoscopy is up-to-date. Immunizations are up-to-date. She is not yet due for Pneumovax or Prevnar. However she continues to complain of severe refractory reflux. She is on Zantac as well as omeprazole. She is tried and failed Nexium in the past. Worked well for her but she cannot take it due to an allergic reaction. Symptoms seem to be getting worse. She has tried to change her modify her diet. She can find no dietary association or activity association that triggers the reflux. She is concerned there may be an underlying problem structurally in the esophagus or in the stomach such as a hiatal hernia. Past Medical History  Diagnosis Date  . Cancer ([place])     breast  . Hyperlipidemia   . GERD (gastroesophageal reflux disease)   . Osteopenia   . Osteoporosis    No past surgical history on file. Current Outpatient Prescriptions on File Prior to Visit  Medication Sig Dispense Refill  . alendronate (FOSAMAX) 70 MG tablet Take 1 tablet (70 mg total) by mouth every 7 (seven) days. Take with a full glass of water on an empty stomach. 12 tablet 3  . calcium carbonate (OS-CAL) 600 MG TABS tablet Take 600 mg by mouth 2 (two) times daily with a meal.    . fluticasone (FLONASE) 50 MCG/ACT nasal spray Place 2 sprays into both nostrils daily. 16 g 6  . Multiple Vitamin (MULTIVITAMIN) tablet Take 1 tablet by mouth daily.    Marland Kitchen OVER THE COUNTER MEDICATION Take 1 tablet by mouth daily. Vitamin D 1000 IU     No current facility-administered medications on file prior to visit.   Allergies  Allergen Reactions  . Penicillins Swelling and Rash   Social History   Social History  .  Marital Status: Married    Spouse Name: N/A  . Number of Children: N/A  . Years of Education: N/A   Occupational History  . Not on file.   Social History Main Topics  . Smoking status: Never Smoker   . Smokeless tobacco: Never Used  . Alcohol Use: Yes     Comment: Rare  . Drug Use: No  . 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   Other Topics Concern  . Not on file   Social History Narrative   Family History  Problem Relation Age of Onset  . Heart disease Neg Hx   . Cancer Neg Hx       Review of Systems  All other systems reviewed and are negative.      Objective:   Physical Exam  Constitutional: She is oriented to person, place, and time. She appears well-developed and well-nourished. No distress.  HENT:  Head: Normocephalic and atraumatic.  Right Ear: External ear normal.  Left Ear: External ear normal.  Nose: Nose normal.  Mouth/Throat: Oropharynx is clear and moist. No oropharyngeal exudate.  Eyes: Conjunctivae and EOM are normal. Pupils are equal, round, and reactive to light. Right eye exhibits no discharge. Left eye exhibits no discharge. No scleral icterus.  Neck: Normal range of motion. Neck supple. No JVD present. No tracheal deviation present. No thyromegaly  present.  Cardiovascular: Normal rate, regular rhythm, normal heart sounds and intact distal pulses.  Exam reveals no gallop and no friction rub.   No murmur heard. Pulmonary/Chest: Effort normal and breath sounds normal. No stridor. No respiratory distress. She has no wheezes. She has no rales. She exhibits no tenderness.  Abdominal: Soft. Bowel sounds are normal. She exhibits no distension and no mass. There is no tenderness. There is no rebound and no guarding.  Musculoskeletal: Normal range of motion. She exhibits no edema or tenderness.  Lymphadenopathy:    She has no cervical adenopathy.  Neurological: She is alert and oriented to person, place, and time. She has  normal reflexes. She displays normal reflexes. No cranial nerve deficit. She exhibits normal muscle tone. Coordination normal.  Skin: Skin is warm. No rash noted. She is not diaphoretic. No erythema. No pallor.  Psychiatric: She has a normal mood and affect. Her behavior is normal. Judgment and thought content normal.  Vitals reviewed.         Assessment & Plan:  Routine general medical examination at a health care facility  Gastroesophageal reflux disease without esophagitis - Plan: Ambulatory referral to Gastroenterology  Physical exam is completely normal. I will consult the patient's gastroenterologist Dr. Benson Norway, for possible EGD to further evaluate why she has refractory GERD. She has failed numerous medications. It is possible she has a hiatal hernia. Lab work is excellent. Immunizations are up-to-date. The remainder of her preventative care is up-to-date. Mammogram and Pap smear will be performed at her gynecologist.

## 2015-11-29 ENCOUNTER — Ambulatory Visit (INDEPENDENT_AMBULATORY_CARE_PROVIDER_SITE_OTHER): Payer: BC Managed Care – PPO | Admitting: Family Medicine

## 2015-11-29 DIAGNOSIS — Z23 Encounter for immunization: Secondary | ICD-10-CM | POA: Diagnosis not present

## 2016-07-29 ENCOUNTER — Other Ambulatory Visit: Payer: Self-pay | Admitting: Family Medicine

## 2016-07-29 DIAGNOSIS — Z0000 Encounter for general adult medical examination without abnormal findings: Secondary | ICD-10-CM

## 2016-07-29 DIAGNOSIS — E785 Hyperlipidemia, unspecified: Secondary | ICD-10-CM

## 2016-08-11 ENCOUNTER — Other Ambulatory Visit: Payer: BC Managed Care – PPO

## 2016-08-11 DIAGNOSIS — Z0000 Encounter for general adult medical examination without abnormal findings: Secondary | ICD-10-CM

## 2016-08-11 DIAGNOSIS — E785 Hyperlipidemia, unspecified: Secondary | ICD-10-CM

## 2016-08-11 LAB — CBC WITH DIFFERENTIAL/PLATELET
BASOS PCT: 0 %
Basophils Absolute: 0 cells/uL (ref 0–200)
EOS PCT: 3 %
Eosinophils Absolute: 138 cells/uL (ref 15–500)
HEMATOCRIT: 43.3 % (ref 35.0–45.0)
HEMOGLOBIN: 14.2 g/dL (ref 12.0–15.0)
LYMPHS ABS: 1978 {cells}/uL (ref 850–3900)
Lymphocytes Relative: 43 %
MCH: 31.3 pg (ref 27.0–33.0)
MCHC: 32.8 g/dL (ref 32.0–36.0)
MCV: 95.4 fL (ref 80.0–100.0)
MONO ABS: 460 {cells}/uL (ref 200–950)
MPV: 10.2 fL (ref 7.5–12.5)
Monocytes Relative: 10 %
Neutro Abs: 2024 cells/uL (ref 1500–7800)
Neutrophils Relative %: 44 %
Platelets: 249 10*3/uL (ref 140–400)
RBC: 4.54 MIL/uL (ref 3.80–5.10)
RDW: 13.1 % (ref 11.0–15.0)
WBC: 4.6 10*3/uL (ref 3.8–10.8)

## 2016-08-11 LAB — COMPREHENSIVE METABOLIC PANEL
ALBUMIN: 4.2 g/dL (ref 3.6–5.1)
ALK PHOS: 49 U/L (ref 33–130)
ALT: 20 U/L (ref 6–29)
AST: 23 U/L (ref 10–35)
BILIRUBIN TOTAL: 0.9 mg/dL (ref 0.2–1.2)
BUN: 19 mg/dL (ref 7–25)
CALCIUM: 9.3 mg/dL (ref 8.6–10.4)
CO2: 23 mmol/L (ref 20–31)
CREATININE: 0.8 mg/dL (ref 0.50–0.99)
Chloride: 105 mmol/L (ref 98–110)
GLUCOSE: 87 mg/dL (ref 70–99)
Potassium: 4.3 mmol/L (ref 3.5–5.3)
SODIUM: 140 mmol/L (ref 135–146)
Total Protein: 6.6 g/dL (ref 6.1–8.1)

## 2016-08-11 LAB — TSH: TSH: 3.7 m[IU]/L

## 2016-08-11 LAB — LIPID PANEL
Cholesterol: 224 mg/dL — ABNORMAL HIGH (ref <200)
HDL: 51 mg/dL (ref >50)
LDL CALC: 138 mg/dL — AB (ref <100)
TRIGLYCERIDES: 175 mg/dL — AB (ref <150)
Total CHOL/HDL Ratio: 4.4 Ratio (ref <5.0)
VLDL: 35 mg/dL — ABNORMAL HIGH (ref <30)

## 2016-08-12 LAB — VITAMIN D 25 HYDROXY (VIT D DEFICIENCY, FRACTURES): VIT D 25 HYDROXY: 36 ng/mL (ref 30–100)

## 2016-08-14 ENCOUNTER — Ambulatory Visit (INDEPENDENT_AMBULATORY_CARE_PROVIDER_SITE_OTHER): Payer: BC Managed Care – PPO | Admitting: Family Medicine

## 2016-08-14 ENCOUNTER — Encounter: Payer: Self-pay | Admitting: Family Medicine

## 2016-08-14 VITALS — BP 99/66 | HR 88 | Temp 98.1°F | Resp 16 | Ht 64.5 in | Wt 135.0 lb

## 2016-08-14 DIAGNOSIS — M810 Age-related osteoporosis without current pathological fracture: Secondary | ICD-10-CM | POA: Diagnosis not present

## 2016-08-14 DIAGNOSIS — Z0000 Encounter for general adult medical examination without abnormal findings: Secondary | ICD-10-CM | POA: Diagnosis not present

## 2016-08-14 DIAGNOSIS — K219 Gastro-esophageal reflux disease without esophagitis: Secondary | ICD-10-CM

## 2016-08-14 MED ORDER — DEXLANSOPRAZOLE 60 MG PO CPDR: 60.0000 mg | DELAYED_RELEASE_CAPSULE | Freq: Every day | ORAL | 5 refills | Status: DC

## 2016-08-14 NOTE — Progress Notes (Signed)
Subjective:    Patient ID: Brittany House, female    DOB: 05/23/1950, 66 y.o.   MRN: [id number]  HPI Patient is here today for complete physical exam. Her mammogram and her Pap smear are performed at her gynecologist. Her bone density was performed in 2016 and was significant for osteoporosis but she stopped fosamax due to persistant jaw pain. Her colonoscopy is up-to-date. Immunizations are up-to-date. She is not yet due for Pneumovax or Prevnar. However she continues to complain of severe refractory reflux. She is on Zantac as well as omeprazole. She is tried and failed Nexium in the past.  She also complains of pain in her left hip and in her lower back. She also reports leg swelling but she has bilateral varicose veins and there is no appreciable edema on exam today. She also reports persistent postnasal drip at night unrelieved by Flonase, or veramyst.  Acid reflux and indigestion also seems to be worse at night. Immunization History  Administered Date(s) Administered  . Influenza Split 12/18/2011  . Influenza,inj,Quad PF,36+ Mos 11/16/2012, 12/02/2013, 12/26/2014, 11/29/2015  . Tdap 09/15/2011  . Zoster 07/26/2012     Past Medical History:  Diagnosis Date  . Cancer (HCC)    breast  . GERD (gastroesophageal reflux disease)   . Hyperlipidemia   . Osteopenia   . Osteoporosis    No past surgical history on file. Current Outpatient Prescriptions on File Prior to Visit  Medication Sig Dispense Refill  . calcium carbonate (OS-CAL) 600 MG TABS tablet Take 600 mg by mouth 2 (two) times daily with a meal.    . famotidine (PEPCID AC) 10 MG chewable tablet Chew 10 mg by mouth 2 (two) times daily as needed.     . fluticasone (FLONASE) 50 MCG/ACT nasal spray Place 2 sprays into both nostrils daily. 16 g 6  . Multiple Vitamin (MULTIVITAMIN) tablet Take 1 tablet by mouth daily.    Marland Kitchen omeprazole (PRILOSEC) 20 MG capsule Take 20 mg by mouth daily.    Marland Kitchen OVER THE COUNTER MEDICATION Take 1 tablet by  mouth daily. Vitamin D 1000 IU     No current facility-administered medications on file prior to visit.    Allergies  Allergen Reactions  . Penicillins Swelling and Rash   Social History   Social History  . Marital status: Married    Spouse name: N/A  . Number of children: N/A  . Years of education: N/A   Occupational History  . Not on file.   Social History Main Topics  . Smoking status: Never Smoker  . Smokeless tobacco: Never Used  . Alcohol use Yes     Comment: Rare  . Drug use: No  . 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   Other Topics Concern  . Not on file   Social History Narrative  . No narrative on file   Family History  Problem Relation Age of Onset  . Heart disease Neg Hx   . Cancer Neg Hx       Review of Systems  All other systems reviewed and are negative.      Objective:   Physical Exam  Constitutional: She is oriented to person, place, and time. She appears well-developed and well-nourished. No distress.  HENT:  Head: Normocephalic and atraumatic.  Right Ear: External ear normal.  Left Ear: External ear normal.  Nose: Nose normal.  Mouth/Throat: Oropharynx is clear and moist. No oropharyngeal exudate.  Eyes: Pupils  are equal, round, and reactive to light. Conjunctivae and EOM are normal. Right eye exhibits no discharge. Left eye exhibits no discharge. No scleral icterus.  Neck: Normal range of motion. Neck supple. No JVD present. No tracheal deviation present. No thyromegaly present.  Cardiovascular: Normal rate, regular rhythm, normal heart sounds and intact distal pulses.  Exam reveals no gallop and no friction rub.   No murmur heard. Pulmonary/Chest: Effort normal and breath sounds normal. No stridor. No respiratory distress. She has no wheezes. She has no rales. She exhibits no tenderness.  Abdominal: Soft. Bowel sounds are normal. She exhibits no distension and no mass. There is no tenderness. There is  no rebound and no guarding.  Musculoskeletal: Normal range of motion. She exhibits no edema or tenderness.  Lymphadenopathy:    She has no cervical adenopathy.  Neurological: She is alert and oriented to person, place, and time. She has normal reflexes. No cranial nerve deficit. She exhibits normal muscle tone. Coordination normal.  Skin: Skin is warm. No rash noted. She is not diaphoretic. No erythema. No pallor.  Psychiatric: She has a normal mood and affect. Her behavior is normal. Judgment and thought content normal.  Vitals reviewed.         Assessment & Plan:  Osteoporosis, unspecified osteoporosis type, unspecified pathological fracture presence - Plan: DG Bone Density  Routine general medical examination at a health care facility  Gastroesophageal reflux disease without esophagitis  Physical exam is completely normal.  Begin dexilant 60 mg poqday for reflux which is refractory.  If postnasal drip persists despite adequate treatment of reflux, consider starting on dymista.  Schedule bone density test. Would recommend prolia if osteoporosis is stable or worse.  Immunizations are up-to-date. The remainder of her preventative care is up-to-date. Mammogram and Pap smear will be performed at her gynecologist.  Lab work is significant for a mildly elevated LDL cholesterol of 138 and total cholesterol of 220. Recommended omega-3 fatty acid 2000 mg a day.

## 2016-08-15 ENCOUNTER — Telehealth: Payer: Self-pay | Admitting: Family Medicine

## 2016-08-15 MED ORDER — DEXLANSOPRAZOLE 60 MG PO CPDR
60.0000 mg | DELAYED_RELEASE_CAPSULE | Freq: Every day | ORAL | 3 refills | Status: DC
Start: 2016-08-15 — End: 2017-12-02

## 2016-08-15 NOTE — Telephone Encounter (Signed)
[facility] and [facility] because [facility] not [facility] coverage anymore. Please change script and pharmacy in our system to [facility] on [address].

## 2016-08-15 NOTE — Telephone Encounter (Signed)
Medication called/sent to requested pharmacy and chart updated

## 2016-08-20 ENCOUNTER — Telehealth: Payer: Self-pay | Admitting: *Deleted

## 2016-08-20 NOTE — Telephone Encounter (Signed)
Received call from patient. [phone]~ telephone.   Reports that she has had increase in congestion since starting Dexilant and believes that it is being caused by her sinuses. States that she spoke with MD about changing her "sinus" medication to steroidal based medication and would like MD to advise on recommendations.

## 2016-08-21 MED ORDER — AZELASTINE HCL 0.1 % NA SOLN: 2.0000 | Freq: Two times a day (BID) | NASAL | 12 refills | Status: DC

## 2016-08-21 MED ORDER — FLUTICASONE PROPIONATE 50 MCG/ACT NA SUSP: 2.0000 | Freq: Every day | NASAL | 6 refills | Status: DC

## 2016-08-21 NOTE — Telephone Encounter (Signed)
astepro 2 sprays each nostril twice daily WITH flonase 2 sprays each nostril daily

## 2016-08-21 NOTE — Telephone Encounter (Signed)
Patient aware of providers recommendations and med sent to pharm 

## 2016-10-07 ENCOUNTER — Ambulatory Visit
Admission: RE | Admit: 2016-10-07 | Discharge: 2016-10-07 | Disposition: A | Discharge status: Home / Self Care | Payer: BC Managed Care – PPO | Source: Ambulatory Visit | Attending: Family Medicine | Admitting: Family Medicine

## 2016-10-07 DIAGNOSIS — M810 Age-related osteoporosis without current pathological fracture: Secondary | ICD-10-CM

## 2016-10-13 ENCOUNTER — Telehealth: Payer: Self-pay | Admitting: Family Medicine

## 2016-10-13 NOTE — Telephone Encounter (Signed)
New Message  Pt voiced wanting to know if she can get a copy of her most recent bone density test and the one from two years ago.  Pt voiced she gets them every two years.  Please f/u with pt

## 2016-10-13 NOTE — Telephone Encounter (Signed)
Results mailed to patient.   Call placed to patient and patient made aware per VM.

## 2016-11-12 ENCOUNTER — Ambulatory Visit (INDEPENDENT_AMBULATORY_CARE_PROVIDER_SITE_OTHER): Payer: BC Managed Care – PPO

## 2016-11-12 DIAGNOSIS — Z23 Encounter for immunization: Secondary | ICD-10-CM | POA: Diagnosis not present

## 2016-11-12 NOTE — Progress Notes (Signed)
Patient was seen in office for flu vaccine.PAatietn received vaccine in right gluteal.Patient tolerated well

## 2016-11-13 ENCOUNTER — Ambulatory Visit: Payer: Self-pay

## 2017-01-05 DIAGNOSIS — H00022 Hordeolum internum right lower eyelid: Secondary | ICD-10-CM | POA: Diagnosis not present

## 2017-01-05 DIAGNOSIS — H2513 Age-related nuclear cataract, bilateral: Secondary | ICD-10-CM | POA: Diagnosis not present

## 2017-02-11 ENCOUNTER — Emergency Department (HOSPITAL_COMMUNITY): Payer: Medicare Other

## 2017-02-11 ENCOUNTER — Encounter (HOSPITAL_COMMUNITY): Payer: Self-pay | Admitting: *Deleted

## 2017-02-11 ENCOUNTER — Emergency Department (HOSPITAL_COMMUNITY)
Admission: EM | Admit: 2017-02-11 | Discharge: 2017-02-11 | Disposition: A | Discharge status: Home / Self Care | Payer: Medicare Other | Attending: Emergency Medicine | Admitting: Emergency Medicine

## 2017-02-11 DIAGNOSIS — R112 Nausea with vomiting, unspecified: Secondary | ICD-10-CM | POA: Insufficient documentation

## 2017-02-11 DIAGNOSIS — R1111 Vomiting without nausea: Secondary | ICD-10-CM | POA: Diagnosis not present

## 2017-02-11 DIAGNOSIS — R1012 Left upper quadrant pain: Secondary | ICD-10-CM | POA: Diagnosis not present

## 2017-02-11 DIAGNOSIS — Z79899 Other long term (current) drug therapy: Secondary | ICD-10-CM | POA: Insufficient documentation

## 2017-02-11 DIAGNOSIS — R197 Diarrhea, unspecified: Secondary | ICD-10-CM | POA: Diagnosis not present

## 2017-02-11 DIAGNOSIS — R109 Unspecified abdominal pain: Secondary | ICD-10-CM | POA: Diagnosis not present

## 2017-02-11 DIAGNOSIS — Z853 Personal history of malignant neoplasm of breast: Secondary | ICD-10-CM | POA: Insufficient documentation

## 2017-02-11 DIAGNOSIS — N200 Calculus of kidney: Secondary | ICD-10-CM | POA: Diagnosis not present

## 2017-02-11 LAB — COMPREHENSIVE METABOLIC PANEL
ALT: 20 U/L (ref 14–54)
AST: 22 U/L (ref 15–41)
Albumin: 4 g/dL (ref 3.5–5.0)
Alkaline Phosphatase: 55 U/L (ref 38–126)
Anion gap: 10 (ref 5–15)
BUN: 16 mg/dL (ref 6–20)
CO2: 22 mmol/L (ref 22–32)
Calcium: 9.3 mg/dL (ref 8.9–10.3)
Chloride: 106 mmol/L (ref 101–111)
Creatinine, Ser: 0.77 mg/dL (ref 0.44–1.00)
GFR calc Af Amer: >60 mL/min (ref >60)
GFR calc non Af Amer: >60 mL/min (ref >60)
Glucose, Bld: 92 mg/dL (ref 65–99)
Potassium: 3.5 mmol/L (ref 3.5–5.1)
Sodium: 138 mmol/L (ref 135–145)
Total Bilirubin: 1.3 mg/dL — ABNORMAL HIGH (ref 0.3–1.2)
Total Protein: 7.1 g/dL (ref 6.5–8.1)

## 2017-02-11 LAB — URINALYSIS, ROUTINE W REFLEX MICROSCOPIC
BACTERIA UA: NONE SEEN
BILIRUBIN URINE: NEGATIVE
Glucose, UA: NEGATIVE mg/dL
KETONES UR: 5 mg/dL — AB
LEUKOCYTES UA: NEGATIVE
Nitrite: NEGATIVE
Protein, ur: 30 mg/dL — AB
SQUAMOUS EPITHELIAL / LPF: NONE SEEN
Specific Gravity, Urine: 1.024 (ref 1.005–1.030)
pH: 5 (ref 5.0–8.0)

## 2017-02-11 LAB — LIPASE, BLOOD: LIPASE: 25 U/L (ref 11–51)

## 2017-02-11 LAB — CBC
HEMATOCRIT: 42.2 % (ref 36.0–46.0)
Hemoglobin: 14.3 g/dL (ref 12.0–15.0)
MCH: 32 pg (ref 26.0–34.0)
MCHC: 33.9 g/dL (ref 30.0–36.0)
MCV: 94.4 fL (ref 78.0–100.0)
Platelets: 243 10*3/uL (ref 150–400)
RBC: 4.47 MIL/uL (ref 3.87–5.11)
RDW: 12.8 % (ref 11.5–15.5)
WBC: 8.3 10*3/uL (ref 4.0–10.5)

## 2017-02-11 MED ORDER — HYDROCODONE-ACETAMINOPHEN 5-325 MG PO TABS
1.0000 | ORAL_TABLET | Freq: Once | ORAL | Status: AC
  Administered 2017-02-11: 1 via ORAL
  Filled 2017-02-11: qty 1

## 2017-02-11 MED ORDER — ONDANSETRON 4 MG PO TBDP
4.0000 mg | ORAL_TABLET | ORAL | 0 refills | Status: DC | PRN
Start: 2017-02-11 — End: 2017-05-01

## 2017-02-11 MED ORDER — TAMSULOSIN HCL 0.4 MG PO CAPS: 0.4000 mg | ORAL_CAPSULE | Freq: Every day | ORAL | 0 refills | Status: DC

## 2017-02-11 MED ORDER — IBUPROFEN 800 MG PO TABS
800.0000 mg | ORAL_TABLET | Freq: Once | ORAL | Status: AC
  Administered 2017-02-11: 800 mg via ORAL
  Filled 2017-02-11: qty 1

## 2017-02-11 MED ORDER — IBUPROFEN 800 MG PO TABS: 800.0000 mg | ORAL_TABLET | Freq: Three times a day (TID) | ORAL | 0 refills | Status: DC

## 2017-02-11 MED ORDER — HYDROCODONE-ACETAMINOPHEN 5-325 MG PO TABS: 1.0000 | ORAL_TABLET | ORAL | 0 refills | Status: DC | PRN

## 2017-02-11 NOTE — ED Notes (Signed)
Pt verbalizes understanding of d/c instructions. Pt received prescriptions. Pt ambulatory at d/c with all belongings.  

## 2017-02-11 NOTE — ED Provider Notes (Signed)
[facility] EMERGENCY DEPARTMENT Provider Note   CSN: [id number] Arrival date & time: 02/11/17  1025     History   Chief Complaint Chief Complaint  Patient presents with  . Abdominal Pain  . Flank Pain    HPI Brittany House is a 67 y.o. female.  HPI   Brittany House is a 67yo female with a history of breast cancer (left mastectomy 1998), hyperlipidemia, osteopenia who presents to the emergency department for evaluation of left-sided flank pain.  Patient states that she has had nausea, vomiting and diarrhea for the past 3 days now.  Reports that last night she experienced severe left-sided flank pain which radiated to the back.  Pain was constant, but has gradually improved.  At this time she states her pain is mild and described as a dull ache.  States that she had one episode of vomiting last night, but has not had nausea or vomiting since.  Reports her husband was recently sick with a similar illness, but she was concerned that she had focal left-sided pain.  She denies fevers, chills, melena, hematochezia, dysuria, urinary frequency, hematuria, vaginal bleeding, pelvic pain, vaginal discharge, chest pain, shortness of breath, headache, cough, congestion, sore throat, numbness, weakness.  Previous abdominal surgeries include 3 C-sections and a right-sided inguinal hernia repair.  Past Medical History:  Diagnosis Date  . Cancer (HCC)    breast  . GERD (gastroesophageal reflux disease)   . Hyperlipidemia   . Osteopenia   . Osteoporosis     Patient Active Problem List   Diagnosis Date Noted  . Hyperlipidemia   . Osteopenia     Past Surgical History:  Procedure Laterality Date  . c-sections x 3    . HERNIA REPAIR    . MASTECTOMY      OB History    No data available       Home Medications    Prior to Admission medications   Medication Sig Start Date End Date Taking? Authorizing Provider  calcium carbonate (OS-CAL) 600 MG TABS tablet Take 600 mg by mouth  2 (two) times daily with a meal.   Yes [provider]  metroNIDAZOLE (METROCREAM) 0.75 % cream Apply 1 application topically 2 (two) times daily.   Yes [provider]  Multiple Vitamin (MULTIVITAMIN) tablet Take 1 tablet by mouth daily.   Yes [provider]  omeprazole (PRILOSEC) 20 MG capsule Take 20 mg by mouth daily.   Yes [provider]  OVER THE COUNTER MEDICATION Take 1 tablet by mouth daily. Vitamin D 1000 IU   Yes [provider]  azelastine (ASTELIN) 0.1 % nasal spray Place 2 sprays into both nostrils 2 (two) times daily. Use in each nostril as directed Patient taking differently: Place 2 sprays into both nostrils 2 (two) times daily as needed for rhinitis. Use in each nostril as directed 08/21/16   Susy Frizzle, MD  dexlansoprazole (DEXILANT) 60 MG capsule Take 1 capsule (60 mg total) by mouth daily. Patient not taking: Reported on 02/11/2017 08/15/16   Susy Frizzle, MD  fluticasone [facility]) 50 MCG/ACT nasal spray Place 2 sprays into both nostrils daily. Patient not taking: Reported on 02/11/2017 08/21/16   Susy Frizzle, MD    Family History Family History  Problem Relation Age of Onset  . Heart disease Neg Hx   . Cancer Neg Hx     Social History Social History   Tobacco Use  . Smoking status: Never Smoker  .  Smokeless tobacco: Never Used  Substance Use Topics  . Alcohol use: Yes    Comment: Rare  . Drug use: No     Allergies   Penicillins   Review of Systems Review of Systems  Constitutional: Negative for chills, fatigue and fever.  HENT: Negative for congestion and sore throat.   Eyes: Negative for visual disturbance.  Respiratory: Negative for cough and shortness of breath.   Cardiovascular: Negative for chest pain.  Gastrointestinal: Positive for diarrhea, nausea and vomiting. Negative for abdominal pain.  Genitourinary: Positive for flank pain. Negative for decreased urine volume, difficulty  urinating, dysuria, frequency, hematuria, vaginal bleeding and vaginal discharge.  Musculoskeletal: Negative for back pain and gait problem.  Skin: Negative for rash.  Neurological: Negative for dizziness, weakness, numbness and headaches.  Psychiatric/Behavioral: Negative for agitation.     Physical Exam Updated Vital Signs BP 117/72   Pulse 75   Temp 98 F (36.7 C) (Oral)   Resp 20   SpO2 98%   Physical Exam  Constitutional: She is oriented to person, place, and time. She appears well-developed and well-nourished. No distress.  HENT:  Head: Normocephalic and atraumatic.  Mouth/Throat: Oropharynx is clear and moist. No oropharyngeal exudate.  Mucas membranes moist.  Eyes: Conjunctivae are normal. Pupils are equal, round, and reactive to light. Right eye exhibits no discharge. Left eye exhibits no discharge. No scleral icterus.  Neck: Normal range of motion. Neck supple.  Cardiovascular: Normal rate, regular rhythm and intact distal pulses. Exam reveals no friction rub.  No murmur heard. Pulmonary/Chest: Effort normal and breath sounds normal. No stridor. No respiratory distress. She has no wheezes. She has no rales.  Abdominal: Soft. Bowel sounds are normal. There is no guarding.  Abdomen nontender to palpation.  No guarding or rigidity.  No CVA tenderness.  Musculoskeletal: Normal range of motion.  No midline T-spine or L-spine tenderness.  Lymphadenopathy:    She has no cervical adenopathy.  Neurological: She is alert and oriented to person, place, and time. Coordination normal.  Skin: Skin is warm and dry. Capillary refill takes less than 2 seconds. She is not diaphoretic.  Psychiatric: She has a normal mood and affect. Her behavior is normal.  Nursing note and vitals reviewed.    ED Treatments / Results  Labs (all labs ordered are listed, but only abnormal results are displayed) Labs Reviewed  COMPREHENSIVE METABOLIC PANEL - Abnormal; Notable for the following  components:      Result Value   Total Bilirubin 1.3 (*)    All other components within normal limits  URINALYSIS, ROUTINE W REFLEX MICROSCOPIC - Abnormal; Notable for the following components:   Color, Urine AMBER (*)    APPearance HAZY (*)    Hgb urine dipstick LARGE (*)    Ketones, ur 5 (*)    Protein, ur 30 (*)    All other components within normal limits  LIPASE, BLOOD  CBC    EKG  EKG Interpretation None       Radiology No results found.  Procedures Procedures (including critical care time)  Medications Ordered in ED Medications - No data to display   Initial Impression / Assessment and Plan / ED Course  I have reviewed the triage vital signs and the nursing notes.  Pertinent labs & imaging results that were available during my care of the patient were reviewed by me and considered in my medical decision making (see chart for details).    Patient presents with left sided flank  pain which began this morning.  Had associated nausea and vomiting. Reports her symptoms have gradually improved since the morning.   On exam she is in no acute distress, afebrile and non-toxic appearing. VSS. She does not have any abdominal tenderness. Denies nausea.   Lab work reviewed.  CMP, CBC and lipase within normal limits.  UA reveals a large amount of hemoglobin with RBCs TNTC.  No previous urine to compare to.  Patient denies urinary symptoms, do not suspect UTI.  It is possible that patient's symptoms are related to recently passed stone, as she currently denies symptoms.  Discussed this patient with Dr. Vallery Ridge who suggests a CT stone study to evaluate for renal stone.  Awaiting CT stone study.  Shift change and handout given to Dr. Vallery Ridge for disposition following result of CT stone study. Plan to have patient follow up with urology for further evaluation of hematuria if stone study is negative. If positive, treat for stone.   Final Clinical Impressions(s) / ED Diagnoses    Final diagnoses:  Left flank pain    ED Discharge Orders    None       Bernarda Caffey 02/11/17 2038    Charlesetta Shanks, MD 02/11/17 2141

## 2017-02-11 NOTE — Discharge Instructions (Signed)
1.  Take ibuprofen for pain control.  Also use Vicodin if you are developing any pain.  Zofran for nausea if needed.  Try to stay ahead of the pain so that you do not develop nausea and vomiting from severe pain. 2.  Schedule recheck with your family doctor. 3.  Take Flomax once daily to help pass the kidney stone. 4.  Return to the emergency department if you develop fever, uncontrolled pain, uncontrolled vomiting or other concerning symptoms.

## 2017-02-11 NOTE — ED Triage Notes (Signed)
To ED for eval of n/v/d/abd pain since Sunday. States last pm she started with left flank pain - this is what prompted pt to call pcp who told pt to come to ED. Denies fevers or difficulty urinating. Denies blood in stool- describes it as mustard color and very loose.

## 2017-02-11 NOTE — ED Notes (Signed)
ED Provider at bedside. 

## 2017-02-11 NOTE — ED Notes (Signed)
Patient transported to CT 

## 2017-02-17 ENCOUNTER — Ambulatory Visit (INDEPENDENT_AMBULATORY_CARE_PROVIDER_SITE_OTHER): Payer: Medicare Other | Admitting: Family Medicine

## 2017-02-17 VITALS — BP 128/70 | HR 64 | Temp 97.7°F | Wt 136.0 lb

## 2017-02-17 DIAGNOSIS — N200 Calculus of kidney: Secondary | ICD-10-CM

## 2017-02-17 LAB — BASIC METABOLIC PANEL WITH GFR
BUN: 15 mg/dL (ref 7–25)
CO2: 30 mmol/L (ref 20–32)
CREATININE: 0.74 mg/dL (ref 0.50–0.99)
Calcium: 9.1 mg/dL (ref 8.6–10.4)
Chloride: 103 mmol/L (ref 98–110)
GFR, EST AFRICAN AMERICAN: 99 mL/min/{1.73_m2} (ref >=60)
GFR, Est Non African American: 85 mL/min/{1.73_m2} (ref >=60)
GLUCOSE: 86 mg/dL (ref 65–99)
Potassium: 3.6 mmol/L (ref 3.5–5.3)
SODIUM: 140 mmol/L (ref 135–146)

## 2017-02-17 LAB — URINALYSIS, ROUTINE W REFLEX MICROSCOPIC
BACTERIA UA: NONE SEEN /HPF
Bilirubin Urine: NEGATIVE
Glucose, UA: NEGATIVE
Ketones, ur: NEGATIVE
Leukocytes, UA: NEGATIVE
Nitrite: NEGATIVE
SPECIFIC GRAVITY, URINE: 1.025 (ref 1.001–1.03)
Squamous Epithelial / LPF: NONE SEEN /HPF (ref <=5)
pH: 6 (ref 5.0–8.0)

## 2017-02-17 LAB — MICROSCOPIC MESSAGE

## 2017-02-17 NOTE — Progress Notes (Signed)
Subjective:    Patient ID: Brittany House, female    DOB: 1950-03-02, 67 y.o.   MRN: [id number]  HPI Patient was seen in the ER 1/9 and was found to have left-sided nephrolithiasis 4 mm in diameter with mild hydronephrosis. There was also a 53mm stone in the left kidney.  She was given pain medication and flomax and referred to me for follow up.  Continues to have left flank pain though much better 2/10 vs 8/10.  Has not yet passed the stone.  Denies fever, chills, unrelenting pain, or gross hematuria.   Past Medical History:  Diagnosis Date  . Cancer (HCC)    breast  . GERD (gastroesophageal reflux disease)   . Hyperlipidemia   . Osteopenia   . Osteoporosis    Past Surgical History:  Procedure Laterality Date  . c-sections x 3    . HERNIA REPAIR    . MASTECTOMY     Current Outpatient Medications on File Prior to Visit  Medication Sig Dispense Refill  . azelastine (ASTELIN) 0.1 % nasal spray Place 2 sprays into both nostrils 2 (two) times daily. Use in each nostril as directed (Patient taking differently: Place 2 sprays into both nostrils 2 (two) times daily as needed for rhinitis. Use in each nostril as directed) 30 mL 12  . calcium carbonate (OS-CAL) 600 MG TABS tablet Take 600 mg by mouth 2 (two) times daily with a meal.    . dexlansoprazole (DEXILANT) 60 MG capsule Take 1 capsule (60 mg total) by mouth daily. (Patient not taking: Reported on 02/11/2017) 90 capsule 3  . fluticasone (FLONASE) 50 MCG/ACT nasal spray Place 2 sprays into both nostrils daily. (Patient not taking: Reported on 02/11/2017) 16 g 6  . HYDROcodone-acetaminophen (NORCO/VICODIN) 5-325 MG tablet Take 1-2 tablets by mouth every 4 (four) hours as needed for moderate pain or severe pain. 20 tablet 0  . ibuprofen (ADVIL,MOTRIN) 800 MG tablet Take 1 tablet (800 mg total) by mouth 3 (three) times daily. 21 tablet 0  . metroNIDAZOLE (METROCREAM) 0.75 % cream Apply 1 application topically 2 (two) times daily.    . Multiple  Vitamin (MULTIVITAMIN) tablet Take 1 tablet by mouth daily.    Marland Kitchen omeprazole (PRILOSEC) 20 MG capsule Take 20 mg by mouth daily.    . ondansetron (ZOFRAN ODT) 4 MG disintegrating tablet Take 1 tablet (4 mg total) by mouth every 4 (four) hours as needed for nausea or vomiting. 20 tablet 0  . OVER THE COUNTER MEDICATION Take 1 tablet by mouth daily. Vitamin D 1000 IU    . tamsulosin (FLOMAX) 0.4 MG CAPS capsule Take 1 capsule (0.4 mg total) by mouth daily. 30 capsule 0   No current facility-administered medications on file prior to visit.    Allergies  Allergen Reactions  . Penicillins Swelling and Rash    Has patient had a PCN reaction causing immediate rash, facial/tongue/throat swelling, SOB or lightheadedness with hypotension: Yes Has patient had a PCN reaction causing severe rash involving mucus membranes or skin necrosis: Yes Has patient had a PCN reaction that required hospitalization: No Has patient had a PCN reaction occurring within the last 10 years: No If all of the above answers are "NO", then may proceed with Cephalosporin use.    Social History   Socioeconomic History  . Marital status: Married    Spouse name: Not on file  . Number of children: Not on file  . Years of education: Not on file  . Highest education  level: Not on file  Social Needs  . Financial resource strain: Not on file  . Food insecurity - worry: Not on file  . Food insecurity - inability: Not on file  . Transportation needs - medical: Not on file  . Transportation needs - non-medical: Not on file  Occupational History  . Not on file  Tobacco Use  . Smoking status: Never Smoker  . Smokeless tobacco: Never Used  Substance and Sexual Activity  . Alcohol use: Yes    Comment: Rare  . Drug use: No  . 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  Other Topics Concern  . Not on file  Social History Narrative  . Not on file    Review of Systems  All other systems reviewed  and are negative.      Objective:   Physical Exam  Cardiovascular: Normal rate, regular rhythm and normal heart sounds.  Pulmonary/Chest: Effort normal and breath sounds normal. No respiratory distress. She has no wheezes. She has no rales.  Abdominal: Soft. Bowel sounds are normal. She exhibits no distension. There is no tenderness. There is no rebound and no guarding.  Vitals reviewed.         Assessment & Plan:  Nephrolithiasis - Plan: BASIC METABOLIC PANEL WITH GFR, Urinalysis, Routine w reflex microscopic, Ambulatory referral to Urology  Explained to patient that the majority of stones less than 5 mm pass spontaneously.  Most pass within 2 weeks.  It has been 1 week.  Recommended 1 week of additional tincture of time.  Symptomatically she is better.  No indication for urgent referral.  Will make referral out past 1 week in case stone persists.

## 2017-02-20 DIAGNOSIS — H2513 Age-related nuclear cataract, bilateral: Secondary | ICD-10-CM | POA: Diagnosis not present

## 2017-02-20 DIAGNOSIS — H43812 Vitreous degeneration, left eye: Secondary | ICD-10-CM | POA: Diagnosis not present

## 2017-02-20 DIAGNOSIS — H00022 Hordeolum internum right lower eyelid: Secondary | ICD-10-CM | POA: Diagnosis not present

## 2017-02-20 DIAGNOSIS — H04123 Dry eye syndrome of bilateral lacrimal glands: Secondary | ICD-10-CM | POA: Diagnosis not present

## 2017-03-03 DIAGNOSIS — R3121 Asymptomatic microscopic hematuria: Secondary | ICD-10-CM | POA: Diagnosis not present

## 2017-03-03 DIAGNOSIS — N201 Calculus of ureter: Secondary | ICD-10-CM | POA: Diagnosis not present

## 2017-03-03 DIAGNOSIS — N132 Hydronephrosis with renal and ureteral calculous obstruction: Secondary | ICD-10-CM | POA: Diagnosis not present

## 2017-03-19 DIAGNOSIS — N201 Calculus of ureter: Secondary | ICD-10-CM | POA: Diagnosis not present

## 2017-04-02 DIAGNOSIS — N201 Calculus of ureter: Secondary | ICD-10-CM | POA: Diagnosis not present

## 2017-04-02 DIAGNOSIS — N132 Hydronephrosis with renal and ureteral calculous obstruction: Secondary | ICD-10-CM | POA: Diagnosis not present

## 2017-04-02 DIAGNOSIS — R8279 Other abnormal findings on microbiological examination of urine: Secondary | ICD-10-CM | POA: Diagnosis not present

## 2017-04-09 DIAGNOSIS — L821 Other seborrheic keratosis: Secondary | ICD-10-CM | POA: Diagnosis not present

## 2017-04-09 DIAGNOSIS — L820 Inflamed seborrheic keratosis: Secondary | ICD-10-CM | POA: Diagnosis not present

## 2017-04-10 ENCOUNTER — Ambulatory Visit (INDEPENDENT_AMBULATORY_CARE_PROVIDER_SITE_OTHER): Payer: Medicare Other | Admitting: Family Medicine

## 2017-04-10 ENCOUNTER — Encounter: Payer: Self-pay | Admitting: Family Medicine

## 2017-04-10 VITALS — BP 108/70 | HR 84 | Temp 97.7°F | Resp 12 | Ht 64.5 in | Wt 136.0 lb

## 2017-04-10 DIAGNOSIS — J069 Acute upper respiratory infection, unspecified: Secondary | ICD-10-CM | POA: Diagnosis not present

## 2017-04-10 MED ORDER — LIDOCAINE VISCOUS 2 % MT SOLN: 20.0000 mL | OROMUCOSAL | 0 refills | Status: DC | PRN

## 2017-04-10 MED ORDER — AZITHROMYCIN 250 MG PO TABS: ORAL_TABLET | ORAL | 0 refills | Status: DC

## 2017-04-10 NOTE — Addendum Note (Signed)
Addended by: Shary Decamp B on: 04/10/2017 05:19 PM   Modules accepted: Orders

## 2017-04-10 NOTE — Progress Notes (Signed)
Subjective:    Patient ID: Brittany House, female    DOB: [birth year], 67 y.o.   MRN: [id number]  HPI Symptoms began yesterday with a sore throat. However the patient describes a sore throat is more intense than sore throat she is experienced in the past. This is more of a deep ache down in the throat. There is no palpable lymphadenopathy. There is no visual erythema or swelling. She also reports a cough and postnasal drip for last 2 or 3 days. She denies any fever. She denies any odynophagia. She denies any hemoptysis. She denies any hematemesis. She denies any dysphagia. She is tried Designer, industrial/product and Astelin without relief. Past Medical History:  Diagnosis Date  . Cancer (HCC)    breast  . GERD (gastroesophageal reflux disease)   . Hyperlipidemia   . Osteopenia   . Osteoporosis    Past Surgical History:  Procedure Laterality Date  . c-sections x 3    . HERNIA REPAIR    . MASTECTOMY     Current Outpatient Medications on File Prior to Visit  Medication Sig Dispense Refill  . azelastine (ASTELIN) 0.1 % nasal spray Place 2 sprays into both nostrils 2 (two) times daily. Use in each nostril as directed (Patient taking differently: Place 2 sprays into both nostrils 2 (two) times daily as needed for rhinitis. Use in each nostril as directed) 30 mL 12  . calcium carbonate (OS-CAL) 600 MG TABS tablet Take 600 mg by mouth 2 (two) times daily with a meal.    . dexlansoprazole (DEXILANT) 60 MG capsule Take 1 capsule (60 mg total) by mouth daily. (Patient taking differently: Take 60 mg by mouth as needed. ) 90 capsule 3  . fluticasone (FLONASE) 50 MCG/ACT nasal spray Place 2 sprays into both nostrils daily. 16 g 6  . metroNIDAZOLE (METROCREAM) 0.75 % cream Apply 1 application topically 2 (two) times daily.    . Multiple Vitamin (MULTIVITAMIN) tablet Take 1 tablet by mouth daily.    . ondansetron (ZOFRAN ODT) 4 MG disintegrating tablet Take 1 tablet (4 mg total) by mouth every 4 (four) hours as needed for  nausea or vomiting. 20 tablet 0  . OVER THE COUNTER MEDICATION Take 1 tablet by mouth daily. Vitamin D 1000 IU     No current facility-administered medications on file prior to visit.    Allergies  Allergen Reactions  . Penicillins Swelling and Rash    Has patient had a PCN reaction causing immediate rash, facial/tongue/throat swelling, SOB or lightheadedness with hypotension: Yes Has patient had a PCN reaction causing severe rash involving mucus membranes or skin necrosis: Yes Has patient had a PCN reaction that required hospitalization: No Has patient had a PCN reaction occurring within the last 10 years: No If all of the above answers are "NO", then may proceed with Cephalosporin use.    Social History   Socioeconomic History  . Marital status: Married    Spouse name: Not on file  . Number of children: Not on file  . Years of education: Not on file  . Highest education level: Not on file  Social Needs  . Financial resource strain: Not on file  . Food insecurity - worry: Not on file  . Food insecurity - inability: Not on file  . Transportation needs - medical: Not on file  . Transportation needs - non-medical: Not on file  Occupational History  . Not on file  Tobacco Use  . Smoking status: Never Smoker  . Smokeless  tobacco: Never Used  Substance and Sexual Activity  . Alcohol use: Yes    Comment: Rare  . Drug use: No  . 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  Other Topics Concern  . Not on file  Social History Narrative  . Not on file     Review of Systems  All other systems reviewed and are negative.      Objective:   Physical Exam  Constitutional: She appears well-developed and well-nourished.  HENT:  Head: Normocephalic and atraumatic.  Right Ear: External ear normal.  Left Ear: External ear normal.  Nose: Nose normal.  Mouth/Throat: Oropharynx is clear and moist. No oropharyngeal exudate.  Neck: Neck supple.    Cardiovascular: Normal rate, regular rhythm and normal heart sounds. Exam reveals no gallop and no friction rub.  No murmur heard. Pulmonary/Chest: Effort normal and breath sounds normal. No respiratory distress. She has no wheezes. She has no rales.  Abdominal: Soft. Bowel sounds are normal. She exhibits no distension. There is no tenderness. There is no rebound and no guarding.  Vitals reviewed.         Assessment & Plan:  URI, acute  Physical exam is unremarkable. Symptoms are more consistent with a viral pharyngitis/viral URI. I have recommended Duke's Magic mouthwash 1 teaspoon gargle and swallow every 6 hours for sore throat. I recommended ibuprofen 800 mg every 8 hours for sore throat. She can use Afrin nasal spray for 3 days for postnasal drip. If symptoms worsen and she develops a high fever or exudate in the posterior oropharynx, I did give the patient a prescription for a Z-Pak however patient ischemia aware of antibiotic resistance and will only fill the medication over the weekend if she sees a high fever or posterior oropharynx exudate.

## 2017-04-20 ENCOUNTER — Other Ambulatory Visit: Payer: Self-pay | Admitting: Family Medicine

## 2017-04-20 ENCOUNTER — Telehealth: Payer: Self-pay | Admitting: Family Medicine

## 2017-04-20 MED ORDER — HYDROCODONE-HOMATROPINE 5-1.5 MG/5ML PO SYRP: 5.0000 mL | ORAL_SOLUTION | Freq: Three times a day (TID) | ORAL | 0 refills | Status: DC | PRN

## 2017-04-20 NOTE — Telephone Encounter (Signed)
If she got better, I suspect this is a new infection (probably viral) and I would give a week for it to pass to reduce the risk of abx resistance unless worrisome symptoms present (fever, sob, purulent sputum).

## 2017-04-20 NOTE — Telephone Encounter (Signed)
Pt wanted to know if you could call her in something for the hacking cough that keeps her up at night.

## 2017-04-20 NOTE — Telephone Encounter (Signed)
Patient says she got better when she was here last about a week and a half ago, now is sick again  Would like a call back at [phone]

## 2017-04-20 NOTE — Telephone Encounter (Signed)
I sent hycodan to [facility]

## 2017-04-20 NOTE — Telephone Encounter (Signed)
LMTRC

## 2017-04-20 NOTE — Telephone Encounter (Signed)
Pt did get the antibx filled and took and felt better and now she is sick again and she has cough that is productive. Pt is still using the nasal prays. She is taking Mucinex DM. No fever.

## 2017-04-21 NOTE — Telephone Encounter (Signed)
Pt aware.

## 2017-04-22 MED ORDER — LEVOFLOXACIN 750 MG PO TABS
750.0000 mg | ORAL_TABLET | Freq: Every day | ORAL | 0 refills | Status: DC
Start: 2017-04-22 — End: 2017-05-01

## 2017-04-22 NOTE — Telephone Encounter (Signed)
Pt has now called back and she is running a fever of 102. Per Dr. Dennard Schaumann she was to call back if she got worse or fever. She was given a Zpack that apparently did not help. Please advise.

## 2017-04-22 NOTE — Addendum Note (Signed)
Addended by: Shary Decamp B on: 04/22/2017 03:17 PM   Modules accepted: Orders

## 2017-04-22 NOTE — Telephone Encounter (Signed)
Brittany House stated that patient was not going to be satisfied with my prior message that said "needed to be seen" so this is an addendum to that. She  has allergy to penicillins She has already been treated with azithromycin At this point will have to use Levaquin----send Rx for Levaquin 750 mg 1 p.o. daily times 7 days dispense #7+0.

## 2017-04-22 NOTE — Telephone Encounter (Signed)
NTBS. Schedule OV. 

## 2017-04-22 NOTE — Telephone Encounter (Signed)
Med sent to pharm and pt aware 

## 2017-04-23 ENCOUNTER — Ambulatory Visit: Payer: Medicare Other | Admitting: Family Medicine

## 2017-04-28 ENCOUNTER — Encounter: Payer: Self-pay | Admitting: Family Medicine

## 2017-04-28 ENCOUNTER — Ambulatory Visit (INDEPENDENT_AMBULATORY_CARE_PROVIDER_SITE_OTHER): Payer: Medicare Other | Admitting: Family Medicine

## 2017-04-28 ENCOUNTER — Ambulatory Visit
Admission: RE | Admit: 2017-04-28 | Discharge: 2017-04-28 | Disposition: A | Discharge status: Home / Self Care | Payer: Medicare Other | Source: Ambulatory Visit | Attending: Family Medicine | Admitting: Family Medicine

## 2017-04-28 VITALS — BP 100/64 | HR 92 | Temp 97.6°F | Resp 14 | Ht 64.5 in | Wt 134.0 lb

## 2017-04-28 DIAGNOSIS — J181 Lobar pneumonia, unspecified organism: Secondary | ICD-10-CM

## 2017-04-28 DIAGNOSIS — J189 Pneumonia, unspecified organism: Secondary | ICD-10-CM

## 2017-04-28 DIAGNOSIS — R05 Cough: Secondary | ICD-10-CM | POA: Diagnosis not present

## 2017-04-28 MED ORDER — PREDNISONE 20 MG PO TABS: ORAL_TABLET | ORAL | 0 refills | Status: DC

## 2017-04-28 MED ORDER — BENZONATATE 200 MG PO CAPS: 200.0000 mg | ORAL_CAPSULE | Freq: Two times a day (BID) | ORAL | 0 refills | Status: DC | PRN

## 2017-04-28 MED ORDER — LEVOFLOXACIN 500 MG PO TABS: 500.0000 mg | ORAL_TABLET | Freq: Every day | ORAL | 0 refills | Status: DC

## 2017-04-28 NOTE — Progress Notes (Signed)
Subjective:    Patient ID: Brittany House, female    DOB: 1950-11-14, 67 y.o.   MRN: [id number]  HPI  04/10/17 Symptoms began yesterday with a sore throat. However the patient describes a sore throat is more intense than sore throat she is experienced in the past. This is more of a deep ache down in the throat. There is no palpable lymphadenopathy. There is no visual erythema or swelling. She also reports a cough and postnasal drip for last 2 or 3 days. She denies any fever. She denies any odynophagia. She denies any hemoptysis. She denies any hematemesis. She denies any dysphagia. She is tried Designer, industrial/product and Astelin without relief.  At that time, my plan was: Physical exam is unremarkable. Symptoms are more consistent with a viral pharyngitis/viral URI. I have recommended Duke's Magic mouthwash 1 teaspoon gargle and swallow every 6 hours for sore throat. I recommended ibuprofen 800 mg every 8 hours for sore throat. She can use Afrin nasal spray for 3 days for postnasal drip. If symptoms worsen and she develops a high fever or exudate in the posterior oropharynx, I did give the patient a prescription for a Z-Pak however patient ischemia aware of antibiotic resistance and will only fill the medication over the weekend if she sees a high fever or posterior oropharynx exudate.  04/28/17 Patient states she ultimately took the Z-Pak and did feel slightly better however last week, her symptoms returned with a vengeance.  They were severe.  She reported left-sided pleurisy, constant cough, chest congestion.  She even had isolated episodes of hemoptysis.  She spoke to my partner who called out Levaquin for 7 days.  She is completed 6 days of Levaquin.  She feels approximately 40-50% better.  The hemoptysis has ceased.  However on exam, she has prominent left-sided rales and rhonchi.  She also has expiratory wheezing. Past Medical History:  Diagnosis Date  . Cancer (HCC)    breast  . GERD (gastroesophageal reflux  disease)   . Hyperlipidemia   . Osteopenia   . Osteoporosis    Past Surgical History:  Procedure Laterality Date  . c-sections x 3    . HERNIA REPAIR    . MASTECTOMY     Current Outpatient Medications on File Prior to Visit  Medication Sig Dispense Refill  . azelastine (ASTELIN) 0.1 % nasal spray Place 2 sprays into both nostrils 2 (two) times daily. Use in each nostril as directed (Patient taking differently: Place 2 sprays into both nostrils 2 (two) times daily as needed for rhinitis. Use in each nostril as directed) 30 mL 12  . calcium carbonate (OS-CAL) 600 MG TABS tablet Take 600 mg by mouth 2 (two) times daily with a meal.    . dexlansoprazole (DEXILANT) 60 MG capsule Take 1 capsule (60 mg total) by mouth daily. (Patient taking differently: Take 60 mg by mouth as needed. ) 90 capsule 3  . fluticasone (FLONASE) 50 MCG/ACT nasal spray Place 2 sprays into both nostrils daily. 16 g 6  . HYDROcodone-homatropine (HYCODAN) 5-1.5 MG/5ML syrup Take 5 mLs by mouth every 8 (eight) hours as needed for cough. 120 mL 0  . levofloxacin (LEVAQUIN) 750 MG tablet Take 1 tablet (750 mg total) by mouth daily. 7 tablet 0  . lidocaine (XYLOCAINE) 2 % solution Use as directed 20 mLs in the mouth or throat as needed for mouth pain. 100 mL 0  . metroNIDAZOLE (METROCREAM) 0.75 % cream Apply 1 application topically 2 (two) times daily.    Marland Kitchen  Multiple Vitamin (MULTIVITAMIN) tablet Take 1 tablet by mouth daily.    . ondansetron (ZOFRAN ODT) 4 MG disintegrating tablet Take 1 tablet (4 mg total) by mouth every 4 (four) hours as needed for nausea or vomiting. 20 tablet 0  . OVER THE COUNTER MEDICATION Take 1 tablet by mouth daily. Vitamin D 1000 IU     No current facility-administered medications on file prior to visit.    Allergies  Allergen Reactions  . Penicillins Swelling and Rash    Has patient had a PCN reaction causing immediate rash, facial/tongue/throat swelling, SOB or lightheadedness with hypotension:  Yes Has patient had a PCN reaction causing severe rash involving mucus membranes or skin necrosis: Yes Has patient had a PCN reaction that required hospitalization: No Has patient had a PCN reaction occurring within the last 10 years: No If all of the above answers are "NO", then may proceed with Cephalosporin use.    Social History   Socioeconomic History  . Marital status: Married    Spouse name: Not on file  . Number of children: Not on file  . Years of education: Not on file  . Highest education level: Not on file  Occupational History  . Not on file  Social Needs  . Financial resource strain: Not on file  . Food insecurity:    Worry: Not on file    Inability: Not on file  . Transportation needs:    Medical: Not on file    Non-medical: Not on file  Tobacco Use  . Smoking status: Never Smoker  . Smokeless tobacco: Never Used  Substance and Sexual Activity  . Alcohol use: Yes    Comment: Rare  . Drug use: No  . 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  Lifestyle  . Physical activity:    Days per week: Not on file    Minutes per session: Not on file  . Stress: Not on file  Relationships  . Social connections:    Talks on phone: Not on file    Gets together: Not on file    Attends religious service: Not on file    Active member of club or organization: Not on file    Attends meetings of clubs or organizations: Not on file    Relationship status: Not on file  . Intimate partner violence:    Fear of current or ex partner: Not on file    Emotionally abused: Not on file    Physically abused: Not on file    Forced sexual activity: Not on file  Other Topics Concern  . Not on file  Social History Narrative  . Not on file     Review of Systems  All other systems reviewed and are negative.      Objective:   Physical Exam  Constitutional: She appears well-developed and well-nourished.  HENT:  Head: Normocephalic and atraumatic.    Right Ear: External ear normal.  Left Ear: External ear normal.  Nose: Nose normal.  Mouth/Throat: Oropharynx is clear and moist. No oropharyngeal exudate.  Neck: Neck supple.  Cardiovascular: Normal rate, regular rhythm and normal heart sounds. Exam reveals no gallop and no friction rub.  No murmur heard. Pulmonary/Chest: Effort normal. No respiratory distress. She has wheezes. She has rhonchi. She has rales.     She exhibits tenderness.  Abdominal: Soft. Bowel sounds are normal. She exhibits no distension. There is no tenderness. There is no rebound and no guarding.  Vitals reviewed.         Assessment & Plan:  Community acquired pneumonia of left lower lobe of lung ([place]) - Plan: predniSONE (DELTASONE) 20 MG tablet, levofloxacin (LEVAQUIN) 500 MG tablet, benzonatate (TESSALON) 200 MG capsule, DG Chest 2 View  I believe the patient developed community-acquired pneumonia.  I do not believe she is clinically resolved.  I have recommended extending the course of the antibiotics for an additional 7 days.  Use Levaquin 500 mg p.o. daily for 7 days.  Obtain chest x-ray to evaluate further to confirm suspicion.  Use Tessalon 200 mg every 8 hours as needed for cough.  Use prednisone taper pack for wheezing.  Recheck next week or sooner if worse

## 2017-04-29 ENCOUNTER — Telehealth: Payer: Self-pay | Admitting: Family Medicine

## 2017-04-29 NOTE — Telephone Encounter (Signed)
Received a call report for from [place] with [facility] on patient chest xray that she had completed today. The impression is as follows:  IMPRESSION: 1. Small irregular nodular density at the left lung base, most likely nipple shadow, less likely pulmonary nodule. Recommend repeat PA chest x-ray with nipple marker in place to ensure benignity. 2. Otherwise normal chest x-ray. No evidence of pneumonia or pulmonary edema.   Results are also in epic.

## 2017-05-01 ENCOUNTER — Encounter: Payer: Self-pay | Admitting: Family Medicine

## 2017-05-01 ENCOUNTER — Ambulatory Visit (INDEPENDENT_AMBULATORY_CARE_PROVIDER_SITE_OTHER): Payer: Medicare Other | Admitting: Family Medicine

## 2017-05-01 VITALS — BP 122/78 | HR 84 | Temp 98.1°F | Resp 14 | Ht 64.5 in | Wt 134.8 lb

## 2017-05-01 DIAGNOSIS — J181 Lobar pneumonia, unspecified organism: Secondary | ICD-10-CM | POA: Diagnosis not present

## 2017-05-01 DIAGNOSIS — J189 Pneumonia, unspecified organism: Secondary | ICD-10-CM

## 2017-05-01 NOTE — Progress Notes (Signed)
R    Patient ID: Brittany House, female    DOB: 07-11-1950, 67 y.o.   MRN: [id number]  PCP: Susy Frizzle, MD  Chief Complaint  Patient presents with  . discomfort under rib cage    follow up from 3/26    Subjective:   Brittany House is a 67 y.o. female, with diagnosis of pneumonia 2 days ago, she presents today for 2-day recheck.  She is taking Levaquin, steroids and Mucinex.  She is coughing up large amounts of phlegm, yellow to green.  She denies any hemoptysis.  Her shortness of breath, fatigue have improved significantly, however she is concerned that she is hearing a lot of rattling in her chest and bubbling.  She has no chest pain at this time, she denies any nausea, vomiting, sweats, lightheadedness.   Patient Active Problem List   Diagnosis Date Noted  . Hyperlipidemia   . Osteopenia      Prior to Admission medications   Medication Sig Start Date End Date Taking? Authorizing Provider  azelastine (ASTELIN) 0.1 % nasal spray Place 2 sprays into both nostrils 2 (two) times daily. Use in each nostril as directed Patient taking differently: Place 2 sprays into both nostrils 2 (two) times daily as needed for rhinitis. Use in each nostril as directed 08/21/16  Yes Pickard, Cammie Mcgee, MD  benzonatate (TESSALON) 200 MG capsule Take 1 capsule (200 mg total) by mouth 2 (two) times daily as needed for cough. 04/28/17  Yes Susy Frizzle, MD  calcium carbonate (OS-CAL) 600 MG TABS tablet Take 600 mg by mouth 2 (two) times daily with a meal.   Yes [provider]  dexlansoprazole (DEXILANT) 60 MG capsule Take 1 capsule (60 mg total) by mouth daily. Patient taking differently: Take 60 mg by mouth as needed.  08/15/16  Yes Susy Frizzle, MD  fluticasone (FLONASE) 50 MCG/ACT nasal spray Place 2 sprays into both nostrils daily. 08/21/16  Yes Susy Frizzle, MD  levofloxacin (LEVAQUIN) 500 MG tablet Take 1 tablet (500 mg total) by mouth daily. 04/28/17  Yes Susy Frizzle, MD    metroNIDAZOLE (METROCREAM) 0.75 % cream Apply 1 application topically 2 (two) times daily.   Yes [provider]  Multiple Vitamin (MULTIVITAMIN) tablet Take 1 tablet by mouth daily.   Yes [provider]  OVER THE COUNTER MEDICATION Take 1 tablet by mouth daily. Vitamin D 1000 IU   Yes [provider]  predniSONE (DELTASONE) 20 MG tablet 3 tabs poqday 1-2, 2 tabs poqday 3-4, 1 tab poqday 5-6 04/28/17  Yes Pickard, Cammie Mcgee, MD     Allergies  Allergen Reactions  . Penicillins Swelling and Rash    Has patient had a PCN reaction causing immediate rash, facial/tongue/throat swelling, SOB or lightheadedness with hypotension: Yes Has patient had a PCN reaction causing severe rash involving mucus membranes or skin necrosis: Yes Has patient had a PCN reaction that required hospitalization: No Has patient had a PCN reaction occurring within the last 10 years: No If all of the above answers are "NO", then may proceed with Cephalosporin use.      Family History  Problem Relation Age of Onset  . Heart disease Neg Hx   . Cancer Neg Hx      Social History   Socioeconomic History  . Marital status: Married    Spouse name: Not on file  . Number of children: Not on file  . Years of education: Not on file  .  Highest education level: Not on file  Occupational History  . Not on file  Social Needs  . Financial resource strain: Not on file  . Food insecurity:    Worry: Not on file    Inability: Not on file  . Transportation needs:    Medical: Not on file    Non-medical: Not on file  Tobacco Use  . Smoking status: Never Smoker  . Smokeless tobacco: Never Used  Substance and Sexual Activity  . Alcohol use: Yes    Comment: Rare  . Drug use: No  . 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  Lifestyle  . Physical activity:    Days per week: Not on file    Minutes per session: Not on file  . Stress: Not on file  Relationships  .  Social connections:    Talks on phone: Not on file    Gets together: Not on file    Attends religious service: Not on file    Active member of club or organization: Not on file    Attends meetings of clubs or organizations: Not on file    Relationship status: Not on file  . Intimate partner violence:    Fear of current or ex partner: Not on file    Emotionally abused: Not on file    Physically abused: Not on file    Forced sexual activity: Not on file  Other Topics Concern  . Not on file  Social History Narrative  . Not on file     Review of Systems  All other systems reviewed and are negative.      Objective:    Vitals:   05/01/17 1133  BP: 122/78  Pulse: 84  Resp: 14  Temp: 98.1 F (36.7 C)  TempSrc: Oral  SpO2: 97%  Weight: 134 lb 12.8 oz (61.1 kg)  Height: 5' 4.5" (1.638 m)     Physical Exam  Constitutional: She appears well-developed and well-nourished.  Non-toxic appearance. No distress.  HENT:  Head: Normocephalic and atraumatic.  Right Ear: External ear normal.  Left Ear: External ear normal.  Nose: Nose normal.  Mouth/Throat: Uvula is midline, oropharynx is clear and moist and mucous membranes are normal.  Eyes: Pupils are equal, round, and reactive to light. Conjunctivae and lids are normal.  Neck: Normal range of motion and phonation normal. Neck supple. No tracheal deviation present.  Cardiovascular: Normal rate, regular rhythm, normal heart sounds, intact distal pulses and normal pulses. Exam reveals no gallop and no friction rub.  No murmur heard. Pulses:      Radial pulses are 2+ on the right side, and 2+ on the left side.  Pulmonary/Chest: Effort normal and breath sounds normal. No stridor. No tachypnea. No respiratory distress. She has no decreased breath sounds. She has no wheezes. She has no rhonchi. She has no rales. She exhibits no tenderness.  Occasional cough  Abdominal: Soft. Normal appearance and bowel sounds are normal. She exhibits no  distension. There is no tenderness.  Musculoskeletal: Normal range of motion. She exhibits no edema or deformity.  Lymphadenopathy:    She has no cervical adenopathy.  Neurological: She is alert. She exhibits normal muscle tone. Coordination and gait normal.  Skin: Skin is warm, dry and intact. Capillary refill takes less than 2 seconds. No rash noted. She is not diaphoretic. No pallor.  Psychiatric: She has a normal mood and affect. Her speech is normal and behavior is normal.  Assessment & Plan:    1. Community acquired pneumonia of left lower lobe of lung [facility]) Patient presented for 2 day recheck.  Overall she feels significantly improved with very minimal shortness of breath, improved energy, no fever, chills or sweats.  She does continue to cough up sputum and feels a lot of rattling in her chest and bubbling.  She was concerned that this may mean that she was not improving however her lungs are clear to auscultation anteriorly and posteriorly today at her visit.  Her vital signs are stable and within normal limits, she has no respiratory distress and no increased work of breathing.  I reviewed x-ray findings from April 28, 2017, unremarkable except for small irregular nodule density at the left lung base, which is possibly nipple shadow and less likely a pulmonary nodule, will defer to Dr. Dennard Schaumann preference for reimaging.  I discussed with the patient how sometimes chest x-rays are repeated after 6 weeks to evaluate for cleared pneumonia.  She does have follow-up next week with Dr. Dennard Schaumann and they will decide imaging at that time.     Delsa Grana, PA-C 05/01/17 1:05 PM

## 2017-05-04 ENCOUNTER — Encounter: Payer: Self-pay | Admitting: Family Medicine

## 2017-05-04 ENCOUNTER — Ambulatory Visit (INDEPENDENT_AMBULATORY_CARE_PROVIDER_SITE_OTHER): Payer: Medicare Other | Admitting: Family Medicine

## 2017-05-04 VITALS — BP 110/70 | HR 76 | Temp 97.5°F | Resp 14 | Ht 64.5 in | Wt 134.0 lb

## 2017-05-04 DIAGNOSIS — J189 Pneumonia, unspecified organism: Secondary | ICD-10-CM

## 2017-05-04 DIAGNOSIS — J181 Lobar pneumonia, unspecified organism: Secondary | ICD-10-CM | POA: Diagnosis not present

## 2017-05-04 NOTE — Progress Notes (Signed)
Subjective:    Patient ID: Brittany House, female    DOB: 09/17/1950, 67 y.o.   MRN: [id number]  HPI  04/10/17 Symptoms began yesterday with a sore throat. However the patient describes a sore throat is more intense than sore throat she is experienced in the past. This is more of a deep ache down in the throat. There is no palpable lymphadenopathy. There is no visual erythema or swelling. She also reports a cough and postnasal drip for last 2 or 3 days. She denies any fever. She denies any odynophagia. She denies any hemoptysis. She denies any hematemesis. She denies any dysphagia. She is tried Designer, industrial/product and Astelin without relief.  At that time, my plan was: Physical exam is unremarkable. Symptoms are more consistent with a viral pharyngitis/viral URI. I have recommended Duke's Magic mouthwash 1 teaspoon gargle and swallow every 6 hours for sore throat. I recommended ibuprofen 800 mg every 8 hours for sore throat. She can use Afrin nasal spray for 3 days for postnasal drip. If symptoms worsen and she develops a high fever or exudate in the posterior oropharynx, I did give the patient a prescription for a Z-Pak however patient ischemia aware of antibiotic resistance and will only fill the medication over the weekend if she sees a high fever or posterior oropharynx exudate.  04/28/17 Patient states she ultimately took the Z-Pak and did feel slightly better however last week, her symptoms returned with a vengeance.  They were severe.  She reported left-sided pleurisy, constant cough, chest congestion.  She even had isolated episodes of hemoptysis.  She spoke to my partner who called out Levaquin for 7 days.  She is completed 6 days of Levaquin.  She feels approximately 40-50% better.  The hemoptysis has ceased.  However on exam, she has prominent left-sided rales and rhonchi.  She also has expiratory wheezing.  At that time, my plan was: I believe the patient developed community-acquired pneumonia.  I do not  believe she is clinically resolved.  I have recommended extending the course of the antibiotics for an additional 7 days.  Use Levaquin 500 mg p.o. daily for 7 days.  Obtain chest x-ray to evaluate further to confirm suspicion.  Use Tessalon 200 mg every 8 hours as needed for cough.  Use prednisone taper pack for wheezing.  Recheck next week or sooner if worse  05/04/17 Patient's breathing is much better having completed her antibiotics.  She denies any further hemoptysis.  She denies any fevers or chills.  Cough is essentially resolved.  Chest x-ray was clear.  She continues to suffer with postnasal drip despite being on Astelin and Flonase Past Medical History:  Diagnosis Date  . Cancer (HCC)    breast  . GERD (gastroesophageal reflux disease)   . Hyperlipidemia   . Osteopenia   . Osteoporosis    Past Surgical History:  Procedure Laterality Date  . c-sections x 3    . HERNIA REPAIR    . MASTECTOMY     Current Outpatient Medications on File Prior to Visit  Medication Sig Dispense Refill  . azelastine (ASTELIN) 0.1 % nasal spray Place 2 sprays into both nostrils 2 (two) times daily. Use in each nostril as directed (Patient taking differently: Place 2 sprays into both nostrils 2 (two) times daily as needed for rhinitis. Use in each nostril as directed) 30 mL 12  . benzonatate (TESSALON) 200 MG capsule Take 1 capsule (200 mg total) by mouth 2 (two) times daily as needed  for cough. 20 capsule 0  . calcium carbonate (OS-CAL) 600 MG TABS tablet Take 600 mg by mouth 2 (two) times daily with a meal.    . dexlansoprazole (DEXILANT) 60 MG capsule Take 1 capsule (60 mg total) by mouth daily. (Patient taking differently: Take 60 mg by mouth as needed. ) 90 capsule 3  . fluticasone (FLONASE) 50 MCG/ACT nasal spray Place 2 sprays into both nostrils daily. 16 g 6  . metroNIDAZOLE (METROCREAM) 0.75 % cream Apply 1 application topically 2 (two) times daily.    . Multiple Vitamin (MULTIVITAMIN) tablet Take 1  tablet by mouth daily.    Marland Kitchen OVER THE COUNTER MEDICATION Take 1 tablet by mouth daily. Vitamin D 1000 IU     No current facility-administered medications on file prior to visit.    Allergies  Allergen Reactions  . Penicillins Swelling and Rash    Has patient had a PCN reaction causing immediate rash, facial/tongue/throat swelling, SOB or lightheadedness with hypotension: Yes Has patient had a PCN reaction causing severe rash involving mucus membranes or skin necrosis: Yes Has patient had a PCN reaction that required hospitalization: No Has patient had a PCN reaction occurring within the last 10 years: No If all of the above answers are "NO", then may proceed with Cephalosporin use.    Social History   Socioeconomic History  . Marital status: Married    Spouse name: Not on file  . Number of children: Not on file  . Years of education: Not on file  . Highest education level: Not on file  Occupational History  . Not on file  Social Needs  . Financial resource strain: Not on file  . Food insecurity:    Worry: Not on file    Inability: Not on file  . Transportation needs:    Medical: Not on file    Non-medical: Not on file  Tobacco Use  . Smoking status: Never Smoker  . Smokeless tobacco: Never Used  Substance and Sexual Activity  . Alcohol use: Yes    Comment: Rare  . Drug use: No  . 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  Lifestyle  . Physical activity:    Days per week: Not on file    Minutes per session: Not on file  . Stress: Not on file  Relationships  . Social connections:    Talks on phone: Not on file    Gets together: Not on file    Attends religious service: Not on file    Active member of club or organization: Not on file    Attends meetings of clubs or organizations: Not on file    Relationship status: Not on file  . Intimate partner violence:    Fear of current or ex partner: Not on file    Emotionally abused: Not on file     Physically abused: Not on file    Forced sexual activity: Not on file  Other Topics Concern  . Not on file  Social History Narrative  . Not on file     Review of Systems  All other systems reviewed and are negative.      Objective:   Physical Exam  Constitutional: She appears well-developed and well-nourished.  HENT:  Head: Normocephalic and atraumatic.  Right Ear: External ear normal.  Left Ear: External ear normal.  Nose: Nose normal.  Mouth/Throat: Oropharynx is clear and moist. No oropharyngeal exudate.  Neck: Neck supple.  Cardiovascular:  Normal rate, regular rhythm and normal heart sounds. Exam reveals no gallop and no friction rub.  No murmur heard. Pulmonary/Chest: Effort normal. No respiratory distress. She has no wheezes. She has no rhonchi. She has no rales. She exhibits no tenderness.  Abdominal: Soft. Bowel sounds are normal. She exhibits no distension. There is no tenderness. There is no rebound and no guarding.  Vitals reviewed.         Assessment & Plan:  Community acquired pneumonia of left lower lobe of lung ([place]) Pneumonia clinically appears to have resolved.  Radiographically on chest x-ray there was no visible pneumonia.  Therefore no further treatment is necessary.  I will add xyzal 5 mg a day to Astelin and Flonase for postnasal drip.

## 2017-07-08 DIAGNOSIS — L570 Actinic keratosis: Secondary | ICD-10-CM | POA: Diagnosis not present

## 2017-07-08 DIAGNOSIS — L814 Other melanin hyperpigmentation: Secondary | ICD-10-CM | POA: Diagnosis not present

## 2017-07-08 DIAGNOSIS — D1801 Hemangioma of skin and subcutaneous tissue: Secondary | ICD-10-CM | POA: Diagnosis not present

## 2017-07-08 DIAGNOSIS — L918 Other hypertrophic disorders of the skin: Secondary | ICD-10-CM | POA: Diagnosis not present

## 2017-07-08 DIAGNOSIS — D2272 Melanocytic nevi of left lower limb, including hip: Secondary | ICD-10-CM | POA: Diagnosis not present

## 2017-07-08 DIAGNOSIS — L820 Inflamed seborrheic keratosis: Secondary | ICD-10-CM | POA: Diagnosis not present

## 2017-07-08 DIAGNOSIS — L821 Other seborrheic keratosis: Secondary | ICD-10-CM | POA: Diagnosis not present

## 2017-07-08 DIAGNOSIS — L718 Other rosacea: Secondary | ICD-10-CM | POA: Diagnosis not present

## 2017-08-17 ENCOUNTER — Encounter: Payer: BC Managed Care – PPO | Admitting: Family Medicine

## 2017-08-17 ENCOUNTER — Other Ambulatory Visit: Payer: Medicare Other

## 2017-08-17 DIAGNOSIS — N200 Calculus of kidney: Secondary | ICD-10-CM | POA: Diagnosis not present

## 2017-08-17 DIAGNOSIS — Z0000 Encounter for general adult medical examination without abnormal findings: Secondary | ICD-10-CM

## 2017-08-17 DIAGNOSIS — M810 Age-related osteoporosis without current pathological fracture: Secondary | ICD-10-CM

## 2017-08-17 DIAGNOSIS — E785 Hyperlipidemia, unspecified: Secondary | ICD-10-CM | POA: Diagnosis not present

## 2017-08-17 LAB — URINALYSIS, ROUTINE W REFLEX MICROSCOPIC
Bilirubin Urine: NEGATIVE
Glucose, UA: NEGATIVE
Hgb urine dipstick: NEGATIVE
KETONES UR: NEGATIVE
NITRITE: NEGATIVE
PH: 7 (ref 5.0–8.0)
Protein, ur: NEGATIVE
RBC / HPF: NONE SEEN /HPF (ref 0–2)
SPECIFIC GRAVITY, URINE: 1.02 (ref 1.001–1.03)

## 2017-08-17 LAB — MICROSCOPIC MESSAGE

## 2017-08-19 LAB — VITAMIN D 1,25 DIHYDROXY
Vitamin D 1, 25 (OH)2 Total: 47 pg/mL (ref 18–72)
Vitamin D3 1, 25 (OH)2: 47 pg/mL

## 2017-08-19 LAB — CBC WITH DIFFERENTIAL/PLATELET
Basophils Absolute: 31 cells/uL (ref 0–200)
Basophils Relative: 0.6 %
EOS PCT: 1.8 %
Eosinophils Absolute: 92 cells/uL (ref 15–500)
HCT: 42.4 % (ref 35.0–45.0)
Hemoglobin: 14.1 g/dL (ref 11.7–15.5)
Lymphs Abs: 1785 cells/uL (ref 850–3900)
MCH: 31.1 pg (ref 27.0–33.0)
MCHC: 33.3 g/dL (ref 32.0–36.0)
MCV: 93.6 fL (ref 80.0–100.0)
MONOS PCT: 8.2 %
MPV: 10.9 fL (ref 7.5–12.5)
NEUTROS PCT: 54.4 %
Neutro Abs: 2774 cells/uL (ref 1500–7800)
PLATELETS: 232 10*3/uL (ref 140–400)
RBC: 4.53 10*6/uL (ref 3.80–5.10)
RDW: 12.2 % (ref 11.0–15.0)
TOTAL LYMPHOCYTE: 35 %
WBC mixed population: 418 cells/uL (ref 200–950)
WBC: 5.1 10*3/uL (ref 3.8–10.8)

## 2017-08-19 LAB — LIPID PANEL
CHOL/HDL RATIO: 4.4 (calc) (ref <5.0)
CHOLESTEROL: 223 mg/dL — AB (ref <200)
HDL: 51 mg/dL (ref >50)
LDL CHOLESTEROL (CALC): 139 mg/dL — AB
Non-HDL Cholesterol (Calc): 172 mg/dL (calc) — ABNORMAL HIGH (ref <130)
Triglycerides: 189 mg/dL — ABNORMAL HIGH (ref <150)

## 2017-08-19 LAB — COMPREHENSIVE METABOLIC PANEL
AG Ratio: 1.8 (calc) (ref 1.0–2.5)
ALBUMIN MSPROF: 4.1 g/dL (ref 3.6–5.1)
ALKALINE PHOSPHATASE (APISO): 61 U/L (ref 33–130)
ALT: 22 U/L (ref 6–29)
AST: 22 U/L (ref 10–35)
BUN: 18 mg/dL (ref 7–25)
CO2: 25 mmol/L (ref 20–32)
CREATININE: 0.77 mg/dL (ref 0.50–0.99)
Calcium: 9.2 mg/dL (ref 8.6–10.4)
Chloride: 107 mmol/L (ref 98–110)
Globulin: 2.3 g/dL (calc) (ref 1.9–3.7)
Glucose, Bld: 92 mg/dL (ref 65–99)
POTASSIUM: 4.5 mmol/L (ref 3.5–5.3)
SODIUM: 142 mmol/L (ref 135–146)
TOTAL PROTEIN: 6.4 g/dL (ref 6.1–8.1)
Total Bilirubin: 0.8 mg/dL (ref 0.2–1.2)

## 2017-08-19 LAB — TSH: TSH: 2.53 m[IU]/L (ref 0.40–4.50)

## 2017-08-20 ENCOUNTER — Encounter: Payer: Self-pay | Admitting: Family Medicine

## 2017-08-20 ENCOUNTER — Ambulatory Visit (INDEPENDENT_AMBULATORY_CARE_PROVIDER_SITE_OTHER): Payer: Medicare Other | Admitting: Family Medicine

## 2017-08-20 VITALS — BP 126/84 | HR 100 | Temp 99.6°F | Resp 14 | Ht 64.5 in | Wt 134.0 lb

## 2017-08-20 DIAGNOSIS — M810 Age-related osteoporosis without current pathological fracture: Secondary | ICD-10-CM | POA: Diagnosis not present

## 2017-08-20 DIAGNOSIS — E785 Hyperlipidemia, unspecified: Secondary | ICD-10-CM | POA: Diagnosis not present

## 2017-08-20 DIAGNOSIS — Z0000 Encounter for general adult medical examination without abnormal findings: Secondary | ICD-10-CM | POA: Diagnosis not present

## 2017-08-20 MED ORDER — AZITHROMYCIN 250 MG PO TABS: ORAL_TABLET | ORAL | 0 refills | Status: DC

## 2017-08-20 NOTE — Progress Notes (Signed)
Subjective:    Patient ID: Brittany House, female    DOB: [birth year], 67 y.o.   MRN: [id number]  HPI Patient is here today for complete physical exam. Her mammogram and her Pap smear are performed at her gynecologist. Her bone density was performed in 2018.  She is planning a repeat bone density next year.  She is on calcium and vitamin D.  Her mammogram is performed annually although we do not have record of this at her gynecologist.  She has an appointment for October.  Her Pap smear is also performed at her gynecologist in October.  Her colonoscopy was performed in 2014 and is due again in 2024.  She politely declines HIV and hepatitis C screening.  She reports a 5-day history of fever.  Last night it was as high as 101.4.  She has left maxillary sinus pain, left frontal sinus pain, postnasal drip causing sore throat, sinus headache, severe head congestion.  Symptoms are consistent with a sinus infection. Past Medical History:  Diagnosis Date  . Cancer (HCC)    breast  . GERD (gastroesophageal reflux disease)   . Hyperlipidemia   . Osteopenia   . Osteoporosis    Past Surgical History:  Procedure Laterality Date  . c-sections x 3    . HERNIA REPAIR    . MASTECTOMY     Current Outpatient Medications on File Prior to Visit  Medication Sig Dispense Refill  . azelastine (ASTELIN) 0.1 % nasal spray Place 2 sprays into both nostrils 2 (two) times daily. Use in each nostril as directed (Patient taking differently: Place 2 sprays into both nostrils 2 (two) times daily as needed for rhinitis. Use in each nostril as directed) 30 mL 12  . benzonatate (TESSALON) 200 MG capsule Take 1 capsule (200 mg total) by mouth 2 (two) times daily as needed for cough. 20 capsule 0  . calcium carbonate (OS-CAL) 600 MG TABS tablet Take 600 mg by mouth 2 (two) times daily with a meal.    . dexlansoprazole (DEXILANT) 60 MG capsule Take 1 capsule (60 mg total) by mouth daily. (Patient taking differently: Take 60 mg by  mouth as needed. ) 90 capsule 3  . fluticasone (FLONASE) 50 MCG/ACT nasal spray Place 2 sprays into both nostrils daily. 16 g 6  . metroNIDAZOLE (METROCREAM) 0.75 % cream Apply 1 application topically 2 (two) times daily.    . Multiple Vitamin (MULTIVITAMIN) tablet Take 1 tablet by mouth daily.    Marland Kitchen OVER THE COUNTER MEDICATION Take 1 tablet by mouth daily. Vitamin D 1000 IU     No current facility-administered medications on file prior to visit.    Allergies  Allergen Reactions  . Penicillins Swelling and Rash    Has patient had a PCN reaction causing immediate rash, facial/tongue/throat swelling, SOB or lightheadedness with hypotension: Yes Has patient had a PCN reaction causing severe rash involving mucus membranes or skin necrosis: Yes Has patient had a PCN reaction that required hospitalization: No Has patient had a PCN reaction occurring within the last 10 years: No If all of the above answers are "NO", then may proceed with Cephalosporin use.    Social History   Socioeconomic History  . Marital status: Married    Spouse name: Not on file  . Number of children: Not on file  . Years of education: Not on file  . Highest education level: Not on file  Occupational History  . Not on file  Social Needs  . Financial  resource strain: Not on file  . Food insecurity:    Worry: Not on file    Inability: Not on file  . Transportation needs:    Medical: Not on file    Non-medical: Not on file  Tobacco Use  . Smoking status: Never Smoker  . Smokeless tobacco: Never Used  Substance and Sexual Activity  . Alcohol use: Yes    Comment: Rare  . Drug use: No  . 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  Lifestyle  . Physical activity:    Days per week: Not on file    Minutes per session: Not on file  . Stress: Not on file  Relationships  . Social connections:    Talks on phone: Not on file    Gets together: Not on file    Attends religious service:  Not on file    Active member of club or organization: Not on file    Attends meetings of clubs or organizations: Not on file    Relationship status: Not on file  . Intimate partner violence:    Fear of current or ex partner: Not on file    Emotionally abused: Not on file    Physically abused: Not on file    Forced sexual activity: Not on file  Other Topics Concern  . Not on file  Social History Narrative  . Not on file   Family History  Problem Relation Age of Onset  . Heart disease Neg Hx   . Cancer Neg Hx       Review of Systems  All other systems reviewed and are negative.      Objective:   Physical Exam  Constitutional: She is oriented to person, place, and time. She appears well-developed and well-nourished. No distress.  HENT:  Head: Normocephalic and atraumatic.  Right Ear: External ear normal.  Left Ear: External ear normal.  Nose: Mucosal edema and rhinorrhea present. Left sinus exhibits maxillary sinus tenderness and frontal sinus tenderness.  Mouth/Throat: Oropharynx is clear and moist. No oropharyngeal exudate.  Eyes: Pupils are equal, round, and reactive to light. Conjunctivae and EOM are normal. Right eye exhibits no discharge. Left eye exhibits no discharge. No scleral icterus.  Neck: Normal range of motion. Neck supple. No JVD present. No tracheal deviation present. No thyromegaly present.  Cardiovascular: Normal rate, regular rhythm, normal heart sounds and intact distal pulses. Exam reveals no gallop and no friction rub.  No murmur heard. Pulmonary/Chest: Effort normal and breath sounds normal. No stridor. No respiratory distress. She has no wheezes. She has no rales. She exhibits no tenderness.  Abdominal: Soft. Bowel sounds are normal. She exhibits no distension and no mass. There is no tenderness. There is no rebound and no guarding.  Musculoskeletal: Normal range of motion. She exhibits no edema or tenderness.  Lymphadenopathy:    She has no cervical  adenopathy.  Neurological: She is alert and oriented to person, place, and time. She has normal reflexes. No cranial nerve deficit. She exhibits normal muscle tone. Coordination normal.  Skin: Skin is warm. No rash noted. She is not diaphoretic. No erythema. No pallor.  Psychiatric: She has a normal mood and affect. Her behavior is normal. Judgment and thought content normal.  Vitals reviewed.         Assessment & Plan:  General medical exam  Hyperlipidemia, unspecified hyperlipidemia type  Osteoporosis, unspecified osteoporosis type, unspecified pathological fracture presence  Patient's colonoscopy is up-to-date.  Her mammogram and Pap smear are going to be performed in October at her gynecologist office.  I have requested that she ask her gynecologist to send [place] records.  Otherwise her mammogram and Pap smear are up-to-date.  She is due for Prevnar 13 however she is sick today and defers that until she feels better.  I will treat her sinus infection with a Z-Pak.  Once she is better she can return for Prevnar 13 and the Pneumovax 23 next year.  Repeat bone density test next year.  Calculated 10-year risk of cardiovascular disease and found to be 5.9%.  Patient does not require a statin at this time.  Regular anticipatory guidance is provided.

## 2017-08-24 ENCOUNTER — Other Ambulatory Visit: Payer: Self-pay | Admitting: Family Medicine

## 2017-08-24 ENCOUNTER — Telehealth: Payer: Self-pay | Admitting: Family Medicine

## 2017-08-24 DIAGNOSIS — J181 Lobar pneumonia, unspecified organism: Principal | ICD-10-CM

## 2017-08-24 DIAGNOSIS — J189 Pneumonia, unspecified organism: Secondary | ICD-10-CM

## 2017-08-24 MED ORDER — LEVOFLOXACIN 500 MG PO TABS: 500.0000 mg | ORAL_TABLET | Freq: Every day | ORAL | 0 refills | Status: DC

## 2017-08-24 NOTE — Telephone Encounter (Signed)
Pt states that she has finished antibx and now has severe chest congestion, cough and very painful to breathe and is concerned about getting PNA again.  Per Dr. Dennard Schaumann Levaquin 500mg  qd x 7 days  Med sent to pharm and pt aware.

## 2017-08-26 DIAGNOSIS — H04123 Dry eye syndrome of bilateral lacrimal glands: Secondary | ICD-10-CM | POA: Diagnosis not present

## 2017-08-26 DIAGNOSIS — H2513 Age-related nuclear cataract, bilateral: Secondary | ICD-10-CM | POA: Diagnosis not present

## 2017-08-26 DIAGNOSIS — H01001 Unspecified blepharitis right upper eyelid: Secondary | ICD-10-CM | POA: Diagnosis not present

## 2017-08-26 DIAGNOSIS — H43813 Vitreous degeneration, bilateral: Secondary | ICD-10-CM | POA: Diagnosis not present

## 2017-09-28 ENCOUNTER — Ambulatory Visit (INDEPENDENT_AMBULATORY_CARE_PROVIDER_SITE_OTHER): Payer: Medicare Other

## 2017-09-28 DIAGNOSIS — Z23 Encounter for immunization: Secondary | ICD-10-CM

## 2017-09-28 NOTE — Progress Notes (Signed)
Patient came in for Prevnar 13.Vaccine given in the right upper quadrant gluteus per patient's request. Patient tolerated injection well. Vaccine information sheet given to patient.

## 2017-10-02 DIAGNOSIS — T7840XA Allergy, unspecified, initial encounter: Secondary | ICD-10-CM | POA: Diagnosis not present

## 2017-10-02 DIAGNOSIS — R0982 Postnasal drip: Secondary | ICD-10-CM | POA: Insufficient documentation

## 2017-10-16 DIAGNOSIS — J329 Chronic sinusitis, unspecified: Secondary | ICD-10-CM | POA: Diagnosis not present

## 2017-10-30 DIAGNOSIS — R0982 Postnasal drip: Secondary | ICD-10-CM | POA: Diagnosis not present

## 2017-10-30 DIAGNOSIS — J323 Chronic sphenoidal sinusitis: Secondary | ICD-10-CM | POA: Diagnosis not present

## 2017-11-02 DIAGNOSIS — K219 Gastro-esophageal reflux disease without esophagitis: Secondary | ICD-10-CM | POA: Diagnosis not present

## 2017-11-02 DIAGNOSIS — Z86010 Personal history of colonic polyps: Secondary | ICD-10-CM | POA: Diagnosis not present

## 2017-11-02 DIAGNOSIS — J329 Chronic sinusitis, unspecified: Secondary | ICD-10-CM | POA: Diagnosis not present

## 2017-11-09 ENCOUNTER — Telehealth: Payer: Self-pay | Admitting: Family Medicine

## 2017-11-09 NOTE — Telephone Encounter (Signed)
Patient received a bill from [place] from [facility] 08/17/17 for urinalysis she states the reason she had the test was to check the [place] count in follow up on kidney stones. The dx code that was associated with urinalysis was Routine General Medical exam please provider correct DX code so I can resubmit.  CB#  [phone]

## 2017-11-09 NOTE — Telephone Encounter (Signed)
I guess nephrolithiasis.  Patient had urine on 7/15 before I saw her on 7/18 at her request.  Not sure why she wanted it drawn.

## 2017-11-09 NOTE — Telephone Encounter (Signed)
I do not see anything in note about this?

## 2017-11-17 ENCOUNTER — Encounter: Payer: Self-pay | Admitting: Family Medicine

## 2017-11-17 DIAGNOSIS — K621 Rectal polyp: Secondary | ICD-10-CM | POA: Diagnosis not present

## 2017-11-17 DIAGNOSIS — K5140 Inflammatory polyps of colon without complications: Secondary | ICD-10-CM | POA: Diagnosis not present

## 2017-11-17 DIAGNOSIS — K635 Polyp of colon: Secondary | ICD-10-CM | POA: Diagnosis not present

## 2017-11-17 DIAGNOSIS — D128 Benign neoplasm of rectum: Secondary | ICD-10-CM | POA: Diagnosis not present

## 2017-11-17 DIAGNOSIS — D120 Benign neoplasm of cecum: Secondary | ICD-10-CM | POA: Diagnosis not present

## 2017-11-17 DIAGNOSIS — K5730 Diverticulosis of large intestine without perforation or abscess without bleeding: Secondary | ICD-10-CM | POA: Diagnosis not present

## 2017-11-17 DIAGNOSIS — Z86010 Personal history of colonic polyps: Secondary | ICD-10-CM | POA: Diagnosis not present

## 2017-11-17 LAB — HM COLONOSCOPY

## 2017-11-18 ENCOUNTER — Encounter: Payer: Self-pay | Admitting: *Deleted

## 2017-11-20 LAB — HM COLONOSCOPY

## 2017-11-23 ENCOUNTER — Encounter: Payer: Self-pay | Admitting: *Deleted

## 2017-11-30 ENCOUNTER — Ambulatory Visit (INDEPENDENT_AMBULATORY_CARE_PROVIDER_SITE_OTHER): Payer: Medicare Other

## 2017-11-30 DIAGNOSIS — Z23 Encounter for immunization: Secondary | ICD-10-CM

## 2017-11-30 NOTE — Progress Notes (Signed)
Patient was in office for flu vaccine. Patient  Received the vaccine in her right ventrogluteal. Patient tolerated well

## 2017-12-01 DIAGNOSIS — Z124 Encounter for screening for malignant neoplasm of cervix: Secondary | ICD-10-CM | POA: Diagnosis not present

## 2017-12-01 DIAGNOSIS — Z1231 Encounter for screening mammogram for malignant neoplasm of breast: Secondary | ICD-10-CM | POA: Diagnosis not present

## 2017-12-01 DIAGNOSIS — Z853 Personal history of malignant neoplasm of breast: Secondary | ICD-10-CM | POA: Diagnosis not present

## 2017-12-02 ENCOUNTER — Other Ambulatory Visit: Payer: Self-pay | Admitting: Family Medicine

## 2017-12-08 NOTE — Telephone Encounter (Signed)
Spoke with Brittany House at [place] she deleted the Z00.00 Routine code and added N20.0 nephrolithiasis and will resubmit claim.

## 2017-12-21 DIAGNOSIS — L603 Nail dystrophy: Secondary | ICD-10-CM | POA: Diagnosis not present

## 2017-12-21 DIAGNOSIS — S90211A Contusion of right great toe with damage to nail, initial encounter: Secondary | ICD-10-CM | POA: Diagnosis not present

## 2017-12-21 DIAGNOSIS — L602 Onychogryphosis: Secondary | ICD-10-CM | POA: Diagnosis not present

## 2017-12-29 DIAGNOSIS — L603 Nail dystrophy: Secondary | ICD-10-CM | POA: Diagnosis not present

## 2018-01-14 DIAGNOSIS — L602 Onychogryphosis: Secondary | ICD-10-CM | POA: Diagnosis not present

## 2018-01-14 DIAGNOSIS — S90211D Contusion of right great toe with damage to nail, subsequent encounter: Secondary | ICD-10-CM | POA: Diagnosis not present

## 2018-01-16 DIAGNOSIS — J019 Acute sinusitis, unspecified: Secondary | ICD-10-CM | POA: Diagnosis not present

## 2018-02-15 ENCOUNTER — Other Ambulatory Visit: Payer: Self-pay | Admitting: Family Medicine

## 2018-03-11 ENCOUNTER — Ambulatory Visit (INDEPENDENT_AMBULATORY_CARE_PROVIDER_SITE_OTHER): Payer: Medicare Other | Admitting: Family Medicine

## 2018-03-11 ENCOUNTER — Encounter: Payer: Self-pay | Admitting: Family Medicine

## 2018-03-11 VITALS — BP 122/70 | HR 88 | Temp 98.4°F | Resp 12 | Ht 64.5 in | Wt 139.0 lb

## 2018-03-11 DIAGNOSIS — J328 Other chronic sinusitis: Secondary | ICD-10-CM | POA: Diagnosis not present

## 2018-03-11 DIAGNOSIS — J069 Acute upper respiratory infection, unspecified: Secondary | ICD-10-CM

## 2018-03-11 MED ORDER — LEVOCETIRIZINE DIHYDROCHLORIDE 5 MG PO TABS: 5.0000 mg | ORAL_TABLET | Freq: Every evening | ORAL | 5 refills | Status: DC

## 2018-03-11 MED ORDER — CEFDINIR 300 MG PO CAPS: 300.0000 mg | ORAL_CAPSULE | Freq: Two times a day (BID) | ORAL | 0 refills | Status: DC

## 2018-03-11 MED ORDER — PREDNISONE 20 MG PO TABS: ORAL_TABLET | ORAL | 0 refills | Status: DC

## 2018-03-11 NOTE — Progress Notes (Signed)
Subjective:    Patient ID: Brittany House, female    DOB: 25-Jul-1950, 68 y.o.   MRN: [id number]  HPI   Patient saw ENT in August for chronic postnasal drip and cough.  CT scan was performed which showed mild sphenoid sinusitis and minimal thickening of the left maxillary floor.  He recommended no sinus surgery did encourage the patient to use Flonase and Astelin sprays which she has done ever since August.  She states that they have helped some but she continues to have constant postnasal drip and drainage despite being on these sprays.  Approximately 5 days ago, patient developed a sore throat, worsening rhinorrhea, cough, and pain and pressure in her right frontal and right maxillary sinus.  She denies any fevers or chills.  But she has developed a secondary sinusitis on top of her chronic sinusitis Past Medical History:  Diagnosis Date  . Cancer (HCC)    breast  . GERD (gastroesophageal reflux disease)   . Hyperlipidemia   . Osteopenia   . Osteoporosis    Past Surgical History:  Procedure Laterality Date  . c-sections x 3    . HERNIA REPAIR    . MASTECTOMY     Current Outpatient Medications on File Prior to Visit  Medication Sig Dispense Refill  . azelastine (ASTELIN) 0.1 % nasal spray Place 2 sprays into both nostrils 2 (two) times daily. Use in each nostril as directed (Patient taking differently: Place 2 sprays into both nostrils 2 (two) times daily as needed for rhinitis. Use in each nostril as directed) 30 mL 12  . calcium carbonate (OS-CAL) 600 MG TABS tablet Take 600 mg by mouth 2 (two) times daily with a meal.    . DEXILANT 60 MG capsule TAKE 1 CAPSULE(60 MG) BY MOUTH DAILY 90 capsule 0  . fluticasone (FLONASE) 50 MCG/ACT nasal spray SHAKE LIQUID AND USE 2 SPRAYS IN EACH NOSTRIL DAILY 16 g 6  . metroNIDAZOLE (METROCREAM) 0.75 % cream Apply 1 application topically 2 (two) times daily.    . Multiple Vitamin (MULTIVITAMIN) tablet Take 1 tablet by mouth daily.    Marland Kitchen OVER THE  COUNTER MEDICATION Take 1 tablet by mouth daily. Vitamin D 1000 IU     No current facility-administered medications on file prior to visit.    Allergies  Allergen Reactions  . Penicillins Swelling and Rash    Has patient had a PCN reaction causing immediate rash, facial/tongue/throat swelling, SOB or lightheadedness with hypotension: Yes Has patient had a PCN reaction causing severe rash involving mucus membranes or skin necrosis: Yes Has patient had a PCN reaction that required hospitalization: No Has patient had a PCN reaction occurring within the last 10 years: No If all of the above answers are "NO", then may proceed with Cephalosporin use.    Social History   Socioeconomic History  . Marital status: Married    Spouse name: Not on file  . Number of children: Not on file  . Years of education: Not on file  . Highest education level: Not on file  Occupational History  . Not on file  Social Needs  . Financial resource strain: Not on file  . Food insecurity:    Worry: Not on file    Inability: Not on file  . Transportation needs:    Medical: Not on file    Non-medical: Not on file  Tobacco Use  . Smoking status: Never Smoker  . Smokeless tobacco: Never Used  Substance and Sexual Activity  .  Alcohol use: Yes    Comment: Rare  . Drug use: No  . 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  Lifestyle  . Physical activity:    Days per week: Not on file    Minutes per session: Not on file  . Stress: Not on file  Relationships  . Social connections:    Talks on phone: Not on file    Gets together: Not on file    Attends religious service: Not on file    Active member of club or organization: Not on file    Attends meetings of clubs or organizations: Not on file    Relationship status: Not on file  . Intimate partner violence:    Fear of current or ex partner: Not on file    Emotionally abused: Not on file    Physically abused: Not on file     Forced sexual activity: Not on file  Other Topics Concern  . Not on file  Social History Narrative  . Not on file     Review of Systems  All other systems reviewed and are negative.      Objective:   Physical Exam  Constitutional: She appears well-developed and well-nourished.  HENT:  Head: Normocephalic and atraumatic.  Right Ear: Tympanic membrane, external ear and ear canal normal.  Left Ear: Tympanic membrane, external ear and ear canal normal.  Nose: Mucosal edema and rhinorrhea present. Right sinus exhibits maxillary sinus tenderness and frontal sinus tenderness.  Mouth/Throat: Oropharynx is clear and moist. No oropharyngeal exudate.  Neck: Neck supple.  Cardiovascular: Normal rate, regular rhythm and normal heart sounds. Exam reveals no gallop and no friction rub.  No murmur heard. Pulmonary/Chest: Effort normal. No respiratory distress. She has no wheezes. She has no rhonchi. She has no rales. She exhibits no tenderness.  Abdominal: Soft. Bowel sounds are normal. She exhibits no distension. There is no abdominal tenderness. There is no rebound and no guarding.  Vitals reviewed.         Assessment & Plan:  Patient has acute on chronic sinusitis and chronic postnasal drip.  I explained to the patient that the acute sinusitis is most likely a cold and will require 7 to 10 days to totally pass.  I recommended Sudafed tincture of time.  If by Monday, symptoms are no better or if they are worsening, she can use prednisone taper pack coupled with Omnicef 300 mg p.o. twice daily for 10 days.  The patient had an isolated rash to penicillin just on her abdomen many years ago and is willing to try the [facility].  However also believe that she has chronic sinusitis and postnasal drip that is not amenable to surgery.  This I would managed by continuing the Astelin and Flonase but adding Xyzal 5 mg a day.  If this is not sufficient we can add Singulair.  If this is not sufficient we can  proceed with allergy testing

## 2018-03-12 ENCOUNTER — Telehealth: Payer: Self-pay | Admitting: Family Medicine

## 2018-03-12 MED ORDER — LEVOCETIRIZINE DIHYDROCHLORIDE 5 MG PO TABS: 5.0000 mg | ORAL_TABLET | Freq: Every evening | ORAL | 5 refills | Status: DC

## 2018-03-12 MED ORDER — CEFDINIR 300 MG PO CAPS
300.0000 mg | ORAL_CAPSULE | Freq: Two times a day (BID) | ORAL | 0 refills | Status: DC
Start: 2018-03-12 — End: 2018-03-23

## 2018-03-12 MED ORDER — PREDNISONE 20 MG PO TABS: ORAL_TABLET | ORAL | 0 refills | Status: DC

## 2018-03-12 NOTE — Telephone Encounter (Signed)
Pt needs [place] to resend scripts to [facility] on [address] from yesterday.

## 2018-03-12 NOTE — Telephone Encounter (Signed)
Medication called/sent to requested pharmacy  

## 2018-03-16 ENCOUNTER — Telehealth: Payer: Self-pay | Admitting: Family Medicine

## 2018-03-16 NOTE — Telephone Encounter (Signed)
Pt called and states that the antibx she was given is causing her to have very bad diarrhea and would like to have it changed.

## 2018-03-17 MED ORDER — LEVOFLOXACIN 500 MG PO TABS: 500.0000 mg | ORAL_TABLET | Freq: Every day | ORAL | 0 refills | Status: DC

## 2018-03-17 NOTE — Telephone Encounter (Signed)
Pt aware and has probiotic at home and will start taking it and med sent to [facility]

## 2018-03-17 NOTE — Telephone Encounter (Signed)
Could you address this please - she could not take Omnicef d/t stomach pain and diarrhea only got 2 days down and she is still congested really bad and her face is hurting. She would like a different antibx if possible.

## 2018-03-17 NOTE — Telephone Encounter (Signed)
levaquin 500mg  daily x 7 days Continue the other medications Add probiotics OTC

## 2018-03-23 ENCOUNTER — Ambulatory Visit (INDEPENDENT_AMBULATORY_CARE_PROVIDER_SITE_OTHER): Payer: Medicare Other | Admitting: Family Medicine

## 2018-03-23 ENCOUNTER — Encounter: Payer: Self-pay | Admitting: Family Medicine

## 2018-03-23 VITALS — BP 110/70 | HR 80 | Temp 97.9°F | Resp 14 | Ht 64.5 in | Wt 140.0 lb

## 2018-03-23 DIAGNOSIS — J328 Other chronic sinusitis: Secondary | ICD-10-CM | POA: Diagnosis not present

## 2018-03-23 MED ORDER — MONTELUKAST SODIUM 10 MG PO TABS: 10.0000 mg | ORAL_TABLET | Freq: Every day | ORAL | 3 refills | Status: DC

## 2018-03-23 NOTE — Progress Notes (Signed)
Subjective:    Patient ID: Brittany House, female    DOB: 09/15/1950, 68 y.o.   MRN: [id number]  HPI  03/11/18  Patient saw ENT in August for chronic postnasal drip and cough.  CT scan was performed which showed mild sphenoid sinusitis and minimal thickening of the left maxillary floor.  He recommended no sinus surgery did encourage the patient to use Flonase and Astelin sprays which she has done ever since August.  She states that they have helped some but she continues to have constant postnasal drip and drainage despite being on these sprays.  Approximately 5 days ago, patient developed a sore throat, worsening rhinorrhea, cough, and pain and pressure in her right frontal and right maxillary sinus.  She denies any fevers or chills.  But she has developed a secondary sinusitis on top of her chronic sinusitis.  At that time, my plan was: Patient has acute on chronic sinusitis and chronic postnasal drip.  I explained to the patient that the acute sinusitis is most likely a cold and will require 7 to 10 days to totally pass.  I recommended Sudafed tincture of time.  If by Monday, symptoms are no better or if they are worsening, she can use prednisone taper pack coupled with Omnicef 300 mg p.o. twice daily for 10 days.  The patient had an isolated rash to penicillin just on her abdomen many years ago and is willing to try the [facility].  However also believe that she has chronic sinusitis and postnasal drip that is not amenable to surgery.  This I would managed by continuing the Astelin and Flonase but adding Xyzal 5 mg a day.  If this is not sufficient we can add Singulair.  If this is not sufficient we can proceed with allergy testing  03/23/18 Patient took the [place] and had gastrointestinal issues immediately after.  Therefore she only took a few doses of it.  She called back and my partner placed her on Levaquin with prednisone.  She is still on the Levaquin with prednisone however she reports postnasal  drip and persistent constant cough.  The cough is productive of clear yellow snot-like mucus.  She continues to report postnasal drip and chronic drainage and sinus pressure particularly on the right side.  This is despite taking prednisone, Levaquin, Astelin, Flonase, Xyzal, and Sudafed. Past Medical History:  Diagnosis Date  . Cancer (HCC)    breast  . GERD (gastroesophageal reflux disease)   . Hyperlipidemia   . Osteopenia   . Osteoporosis    Past Surgical History:  Procedure Laterality Date  . c-sections x 3    . HERNIA REPAIR    . MASTECTOMY     Current Outpatient Medications on File Prior to Visit  Medication Sig Dispense Refill  . azelastine (ASTELIN) 0.1 % nasal spray Place 2 sprays into both nostrils 2 (two) times daily. Use in each nostril as directed (Patient taking differently: Place 2 sprays into both nostrils 2 (two) times daily as needed for rhinitis. Use in each nostril as directed) 30 mL 12  . calcium carbonate (OS-CAL) 600 MG TABS tablet Take 600 mg by mouth 2 (two) times daily with a meal.    . DEXILANT 60 MG capsule TAKE 1 CAPSULE(60 MG) BY MOUTH DAILY 90 capsule 0  . fluticasone (FLONASE) 50 MCG/ACT nasal spray SHAKE LIQUID AND USE 2 SPRAYS IN EACH NOSTRIL DAILY 16 g 6  . levocetirizine (XYZAL) 5 MG tablet Take 1 tablet (5 mg total) by  mouth every evening. 30 tablet 5  . levofloxacin (LEVAQUIN) 500 MG tablet Take 1 tablet (500 mg total) by mouth daily. 7 tablet 0  . metroNIDAZOLE (METROCREAM) 0.75 % cream Apply 1 application topically 2 (two) times daily.    . Multiple Vitamin (MULTIVITAMIN) tablet Take 1 tablet by mouth daily.    Marland Kitchen OVER THE COUNTER MEDICATION Take 1 tablet by mouth daily. Vitamin D 1000 IU     No current facility-administered medications on file prior to visit.    Allergies  Allergen Reactions  . Penicillins Swelling and Rash    Has patient had a PCN reaction causing immediate rash, facial/tongue/throat swelling, SOB or lightheadedness with  hypotension: Yes Has patient had a PCN reaction causing severe rash involving mucus membranes or skin necrosis: Yes Has patient had a PCN reaction that required hospitalization: No Has patient had a PCN reaction occurring within the last 10 years: No If all of the above answers are "NO", then may proceed with Cephalosporin use.    Social History   Socioeconomic History  . Marital status: Married    Spouse name: Not on file  . Number of children: Not on file  . Years of education: Not on file  . Highest education level: Not on file  Occupational History  . Not on file  Social Needs  . Financial resource strain: Not on file  . Food insecurity:    Worry: Not on file    Inability: Not on file  . Transportation needs:    Medical: Not on file    Non-medical: Not on file  Tobacco Use  . Smoking status: Never Smoker  . Smokeless tobacco: Never Used  Substance and Sexual Activity  . Alcohol use: Yes    Comment: Rare  . Drug use: No  . 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  Lifestyle  . Physical activity:    Days per week: Not on file    Minutes per session: Not on file  . Stress: Not on file  Relationships  . Social connections:    Talks on phone: Not on file    Gets together: Not on file    Attends religious service: Not on file    Active member of club or organization: Not on file    Attends meetings of clubs or organizations: Not on file    Relationship status: Not on file  . Intimate partner violence:    Fear of current or ex partner: Not on file    Emotionally abused: Not on file    Physically abused: Not on file    Forced sexual activity: Not on file  Other Topics Concern  . Not on file  Social History Narrative  . Not on file     Review of Systems  All other systems reviewed and are negative.      Objective:   Physical Exam  Constitutional: She appears well-developed and well-nourished.  HENT:  Head: Normocephalic and  atraumatic.  Right Ear: Tympanic membrane, external ear and ear canal normal.  Left Ear: Tympanic membrane, external ear and ear canal normal.  Nose: Mucosal edema and rhinorrhea present. Right sinus exhibits maxillary sinus tenderness and frontal sinus tenderness.  Mouth/Throat: Oropharynx is clear and moist. No oropharyngeal exudate.  Neck: Neck supple.  Cardiovascular: Normal rate, regular rhythm and normal heart sounds. Exam reveals no gallop and no friction rub.  No murmur heard. Pulmonary/Chest: Effort normal. No respiratory distress. She has  no wheezes. She has no rhonchi. She has no rales. She exhibits no tenderness.  Abdominal: Soft. Bowel sounds are normal. She exhibits no distension. There is no abdominal tenderness. There is no rebound and no guarding.  Vitals reviewed.         Assessment & Plan:  Other chronic sinusitis  Lungs are completely clear to auscultation.  I see no evidence of a pulmonary infection.  I believe her cough is due to drainage and postnasal drip.  At this point I have exhausted all conservative measures.  I will add Singulair 10 mg a day to what she is already taking.  She can continue Flonase, Astelin, and Xyzal.  I have also recommended nasal saline rinses with a Nettie pot.  If not improving over the next 7 days, I would recommend follow-up with ENT for chronic sinusitis and possible surgical options.

## 2018-04-03 ENCOUNTER — Other Ambulatory Visit: Payer: Self-pay | Admitting: Family Medicine

## 2018-04-09 ENCOUNTER — Other Ambulatory Visit: Payer: Self-pay | Admitting: Family Medicine

## 2018-04-09 ENCOUNTER — Telehealth: Payer: Self-pay | Admitting: Family Medicine

## 2018-04-09 DIAGNOSIS — R0982 Postnasal drip: Secondary | ICD-10-CM | POA: Diagnosis not present

## 2018-04-09 DIAGNOSIS — J323 Chronic sphenoidal sinusitis: Secondary | ICD-10-CM | POA: Diagnosis not present

## 2018-04-09 MED ORDER — PANTOPRAZOLE SODIUM 40 MG PO TBEC: 40.0000 mg | DELAYED_RELEASE_TABLET | Freq: Every day | ORAL | 3 refills | Status: DC

## 2018-04-09 NOTE — Telephone Encounter (Signed)
Pt aware via vm 

## 2018-04-09 NOTE — Telephone Encounter (Signed)
Recommend protonix 40 mg poqam I escribed.

## 2018-04-09 NOTE — Telephone Encounter (Signed)
Insurance will not cover Dexilant  - can we send in something else?

## 2018-04-13 ENCOUNTER — Other Ambulatory Visit: Payer: Self-pay | Admitting: Family Medicine

## 2018-04-13 MED ORDER — PANTOPRAZOLE SODIUM 40 MG PO TBEC: 40.0000 mg | DELAYED_RELEASE_TABLET | Freq: Every day | ORAL | 3 refills | Status: DC

## 2018-08-12 ENCOUNTER — Other Ambulatory Visit: Payer: Self-pay

## 2018-08-12 ENCOUNTER — Ambulatory Visit (INDEPENDENT_AMBULATORY_CARE_PROVIDER_SITE_OTHER): Payer: Medicare Other | Admitting: Family Medicine

## 2018-08-12 ENCOUNTER — Encounter: Payer: Self-pay | Admitting: Family Medicine

## 2018-08-12 ENCOUNTER — Ambulatory Visit
Admission: RE | Admit: 2018-08-12 | Discharge: 2018-08-12 | Disposition: A | Discharge status: Home / Self Care | Payer: Medicare Other | Source: Ambulatory Visit | Attending: Family Medicine | Admitting: Family Medicine

## 2018-08-12 VITALS — BP 128/64 | HR 96 | Temp 98.6°F | Resp 14 | Ht 64.5 in | Wt 137.0 lb

## 2018-08-12 DIAGNOSIS — M542 Cervicalgia: Secondary | ICD-10-CM

## 2018-08-12 DIAGNOSIS — M25512 Pain in left shoulder: Secondary | ICD-10-CM | POA: Diagnosis not present

## 2018-08-12 DIAGNOSIS — M898X1 Other specified disorders of bone, shoulder: Secondary | ICD-10-CM

## 2018-08-12 DIAGNOSIS — M47812 Spondylosis without myelopathy or radiculopathy, cervical region: Secondary | ICD-10-CM | POA: Diagnosis not present

## 2018-08-12 MED ORDER — TIZANIDINE HCL 4 MG PO TABS: 4.0000 mg | ORAL_TABLET | Freq: Four times a day (QID) | ORAL | 0 refills | Status: DC | PRN

## 2018-08-12 NOTE — Progress Notes (Signed)
Subjective:    Patient ID: Brittany House, female    DOB: 01/08/1951, 68 y.o.   MRN: [id number]  HPI Patient is a 68 year old white female who reports 1 week of pain located underneath her left shoulder blade.  She denies any falls or injuries.  She denies any pleurisy.  She denies any cough.  She denies any fevers or chills or hemoptysis.  She denies any chest pain.  The pain does not radiate but instead comes and goes in waves.  It does seem to be exacerbated by position changes and twisting on occasion.  She also complains of pain in her neck.  She reports audible crepitus with rotation left and right.  She denies any radicular symptoms radiating down her arm either left or right.  Sometimes the pain in her shoulder blade seems to radiate to her neck. Past Medical History:  Diagnosis Date  . Cancer (HCC)    breast  . GERD (gastroesophageal reflux disease)   . Hyperlipidemia   . Osteopenia   . Osteoporosis    Past Surgical History:  Procedure Laterality Date  . c-sections x 3    . HERNIA REPAIR    . MASTECTOMY     Current Outpatient Medications on File Prior to Visit  Medication Sig Dispense Refill  . calcium carbonate (OS-CAL) 600 MG TABS tablet Take 600 mg by mouth 2 (two) times daily with a meal.    . fluticasone (FLONASE) 50 MCG/ACT nasal spray SHAKE LIQUID AND USE 2 SPRAYS IN EACH NOSTRIL DAILY 16 g 6  . metroNIDAZOLE (METROCREAM) 0.75 % cream Apply 1 application topically 2 (two) times daily.    . Multiple Vitamin (MULTIVITAMIN) tablet Take 1 tablet by mouth daily.     No current facility-administered medications on file prior to visit.    Allergies  Allergen Reactions  . Penicillins Swelling and Rash    Has patient had a PCN reaction causing immediate rash, facial/tongue/throat swelling, SOB or lightheadedness with hypotension: Yes Has patient had a PCN reaction causing severe rash involving mucus membranes or skin necrosis: Yes Has patient had a PCN reaction that required  hospitalization: No Has patient had a PCN reaction occurring within the last 10 years: No If all of the above answers are "NO", then may proceed with Cephalosporin use.    Social History   Socioeconomic History  . Marital status: Married    Spouse name: Not on file  . Number of children: Not on file  . Years of education: Not on file  . Highest education level: Not on file  Occupational History  . Not on file  Social Needs  . Financial resource strain: Not on file  . Food insecurity    Worry: Not on file    Inability: Not on file  . Transportation needs    Medical: Not on file    Non-medical: Not on file  Tobacco Use  . Smoking status: Never Smoker  . Smokeless tobacco: Never Used  Substance and Sexual Activity  . Alcohol use: Yes    Comment: Rare  . Drug use: No  . 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  Lifestyle  . Physical activity    Days per week: Not on file    Minutes per session: Not on file  . Stress: Not on file  Relationships  . Social Herbalist on phone: Not on file    Gets together: Not on file  Attends religious service: Not on file    Active member of club or organization: Not on file    Attends meetings of clubs or organizations: Not on file    Relationship status: Not on file  . Intimate partner violence    Fear of current or ex partner: Not on file    Emotionally abused: Not on file    Physically abused: Not on file    Forced sexual activity: Not on file  Other Topics Concern  . Not on file  Social History Narrative  . Not on file      Review of Systems  All other systems reviewed and are negative.      Objective:   Physical Exam Vitals signs reviewed.  Neck:     Musculoskeletal: Muscular tenderness present.  Cardiovascular:     Rate and Rhythm: Normal rate and regular rhythm.     Heart sounds: Normal heart sounds. No murmur. No friction rub. No gallop.   Pulmonary:     Effort:  Pulmonary effort is normal. No respiratory distress.     Breath sounds: Normal breath sounds. No stridor. No wheezing, rhonchi or rales.  Chest:     Chest wall: No tenderness.  Musculoskeletal:     Left shoulder: She exhibits normal range of motion, no tenderness, no effusion, no crepitus, no pain, no spasm and normal strength.     Cervical back: She exhibits decreased range of motion and pain.       Back:           Assessment & Plan:  The primary encounter diagnosis was Pain of left scapula. A diagnosis of Neck pain was also pertinent to this visit. I am not certain if this is cervical radiculopathy with pain from her neck radiating into her left subscapular area or if this could represent a muscle strain around the scapula but I do believe is musculoskeletal.  I will obtain an x-ray of her neck given the fact she is also having pain in her neck as well as an x-ray of the chest given the deep pain under the left scapula that she is experiencing to rule out other potential causes of subscapular pain.  If x-rays are normal, I would recommend ibuprofen 800 mg every 8 hours and tizanidine every 6 hours for possible muscle spasms and then reassess in 1 week or sooner if worse

## 2018-08-19 ENCOUNTER — Other Ambulatory Visit: Payer: Self-pay

## 2018-08-19 ENCOUNTER — Other Ambulatory Visit: Payer: Medicare Other

## 2018-08-19 DIAGNOSIS — Z13220 Encounter for screening for lipoid disorders: Secondary | ICD-10-CM | POA: Diagnosis not present

## 2018-08-19 DIAGNOSIS — Z0000 Encounter for general adult medical examination without abnormal findings: Secondary | ICD-10-CM | POA: Diagnosis not present

## 2018-08-19 DIAGNOSIS — E785 Hyperlipidemia, unspecified: Secondary | ICD-10-CM | POA: Diagnosis not present

## 2018-08-20 LAB — CBC WITH DIFFERENTIAL/PLATELET
Absolute Monocytes: 656 cells/uL (ref 200–950)
Basophils Absolute: 51 cells/uL (ref 0–200)
Basophils Relative: 0.9 %
Eosinophils Absolute: 291 cells/uL (ref 15–500)
Eosinophils Relative: 5.1 %
HCT: 41.5 % (ref 35.0–45.0)
Hemoglobin: 14.1 g/dL (ref 11.7–15.5)
Lymphs Abs: 1659 cells/uL (ref 850–3900)
MCH: 31.9 pg (ref 27.0–33.0)
MCHC: 34 g/dL (ref 32.0–36.0)
MCV: 93.9 fL (ref 80.0–100.0)
MPV: 11.4 fL (ref 7.5–12.5)
Monocytes Relative: 11.5 %
Neutro Abs: 3044 cells/uL (ref 1500–7800)
Neutrophils Relative %: 53.4 %
Platelets: 220 10*3/uL (ref 140–400)
RBC: 4.42 10*6/uL (ref 3.80–5.10)
RDW: 12.3 % (ref 11.0–15.0)
Total Lymphocyte: 29.1 %
WBC: 5.7 10*3/uL (ref 3.8–10.8)

## 2018-08-20 LAB — COMPREHENSIVE METABOLIC PANEL
AG Ratio: 1.8 (calc) (ref 1.0–2.5)
ALT: 18 U/L (ref 6–29)
AST: 20 U/L (ref 10–35)
Albumin: 3.8 g/dL (ref 3.6–5.1)
Alkaline phosphatase (APISO): 41 U/L (ref 37–153)
BUN: 21 mg/dL (ref 7–25)
CO2: 22 mmol/L (ref 20–32)
Calcium: 9.1 mg/dL (ref 8.6–10.4)
Chloride: 110 mmol/L (ref 98–110)
Creat: 0.75 mg/dL (ref 0.50–0.99)
Globulin: 2.1 g/dL (calc) (ref 1.9–3.7)
Glucose, Bld: 90 mg/dL (ref 65–99)
Potassium: 4.2 mmol/L (ref 3.5–5.3)
Sodium: 142 mmol/L (ref 135–146)
Total Bilirubin: 0.9 mg/dL (ref 0.2–1.2)
Total Protein: 5.9 g/dL — ABNORMAL LOW (ref 6.1–8.1)

## 2018-08-20 LAB — LIPID PANEL
Cholesterol: 196 mg/dL (ref <200)
HDL: 38 mg/dL — ABNORMAL LOW (ref >=50)
LDL Cholesterol (Calc): 128 mg/dL (calc) — ABNORMAL HIGH
Non-HDL Cholesterol (Calc): 158 mg/dL (calc) — ABNORMAL HIGH (ref <130)
Total CHOL/HDL Ratio: 5.2 (calc) — ABNORMAL HIGH (ref <5.0)
Triglycerides: 186 mg/dL — ABNORMAL HIGH (ref <150)

## 2018-08-23 ENCOUNTER — Encounter: Payer: Self-pay | Admitting: Family Medicine

## 2018-08-23 ENCOUNTER — Other Ambulatory Visit: Payer: Self-pay

## 2018-08-23 ENCOUNTER — Ambulatory Visit (INDEPENDENT_AMBULATORY_CARE_PROVIDER_SITE_OTHER): Payer: Medicare Other | Admitting: Family Medicine

## 2018-08-23 VITALS — BP 110/62 | HR 76 | Temp 97.9°F | Resp 16 | Ht 64.5 in | Wt 137.0 lb

## 2018-08-23 DIAGNOSIS — M8580 Other specified disorders of bone density and structure, unspecified site: Secondary | ICD-10-CM | POA: Diagnosis not present

## 2018-08-23 DIAGNOSIS — Z0001 Encounter for general adult medical examination with abnormal findings: Secondary | ICD-10-CM | POA: Diagnosis not present

## 2018-08-23 DIAGNOSIS — E785 Hyperlipidemia, unspecified: Secondary | ICD-10-CM

## 2018-08-23 DIAGNOSIS — Z0000 Encounter for general adult medical examination without abnormal findings: Secondary | ICD-10-CM

## 2018-08-23 NOTE — Progress Notes (Signed)
Subjective:    Patient ID: Brittany House, female    DOB: Oct 24, 1950, 68 y.o.   MRN: [id number]  HPI Patient is here today for complete physical exam.  Patient's mammogram was recently performed by her gynecologist and is up-to-date and normal.  Her Pap smear was last performed in 2017 and was normal.  Due to her age she does not require another.  Her last bone density test was 2 years and was abnormal and showed osteopenia.  Her colonoscopy was performed in 2014 and was normal and is not due again until 2024.  Her immunization records are listed below: Immunization History  Administered Date(s) Administered  . Influenza Split 12/18/2011  . Influenza,inj,Quad PF,6+ Mos 11/16/2012, 12/02/2013, 12/26/2014, 11/29/2015, 11/12/2016, 11/30/2017  . Influenza-Unspecified 11/03/2016  . Pneumococcal Conjugate-13 09/28/2017  . Tdap 09/15/2011  . Zoster 07/26/2012  . Zoster Recombinat (Shingrix) 02/23/2018    Past Medical History:  Diagnosis Date  . Cancer (HCC)    breast  . GERD (gastroesophageal reflux disease)   . Hyperlipidemia   . Osteopenia   . Osteoporosis    Past Surgical History:  Procedure Laterality Date  . c-sections x 3    . HERNIA REPAIR    . MASTECTOMY     Current Outpatient Medications on File Prior to Visit  Medication Sig Dispense Refill  . calcium carbonate (OS-CAL) 600 MG TABS tablet Take 600 mg by mouth 2 (two) times daily with a meal.    . fluticasone (FLONASE) 50 MCG/ACT nasal spray SHAKE LIQUID AND USE 2 SPRAYS IN EACH NOSTRIL DAILY 16 g 6  . metroNIDAZOLE (METROCREAM) 0.75 % cream Apply 1 application topically 2 (two) times daily.    . Multiple Vitamin (MULTIVITAMIN) tablet Take 1 tablet by mouth daily.     No current facility-administered medications on file prior to visit.    Allergies  Allergen Reactions  . Penicillins Swelling and Rash    Has patient had a PCN reaction causing immediate rash, facial/tongue/throat swelling, SOB or lightheadedness with  hypotension: Yes Has patient had a PCN reaction causing severe rash involving mucus membranes or skin necrosis: Yes Has patient had a PCN reaction that required hospitalization: No Has patient had a PCN reaction occurring within the last 10 years: No If all of the above answers are "NO", then may proceed with Cephalosporin use.    Social History   Socioeconomic History  . Marital status: Married    Spouse name: Not on file  . Number of children: Not on file  . Years of education: Not on file  . Highest education level: Not on file  Occupational History  . Not on file  Social Needs  . Financial resource strain: Not on file  . Food insecurity    Worry: Not on file    Inability: Not on file  . Transportation needs    Medical: Not on file    Non-medical: Not on file  Tobacco Use  . Smoking status: Never Smoker  . Smokeless tobacco: Never Used  Substance and Sexual Activity  . Alcohol use: Yes    Comment: Rare  . Drug use: No  . 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  Lifestyle  . Physical activity    Days per week: Not on file    Minutes per session: Not on file  . Stress: Not on file  Relationships  . Social connections    Talks on phone: Not on file  Gets together: Not on file    Attends religious service: Not on file    Active member of club or organization: Not on file    Attends meetings of clubs or organizations: Not on file    Relationship status: Not on file  . Intimate partner violence    Fear of current or ex partner: Not on file    Emotionally abused: Not on file    Physically abused: Not on file    Forced sexual activity: Not on file  Other Topics Concern  . Not on file  Social History Narrative  . Not on file   Family History  Problem Relation Age of Onset  . Heart disease Neg Hx   . Cancer Neg Hx       Review of Systems  All other systems reviewed and are negative.      Objective:   Physical Exam   Constitutional: She is oriented to person, place, and time. She appears well-developed and well-nourished. No distress.  HENT:  Head: Normocephalic and atraumatic.  Right Ear: External ear normal.  Left Ear: External ear normal.  Nose: Mucosal edema and rhinorrhea present. Left sinus exhibits maxillary sinus tenderness and frontal sinus tenderness.  Mouth/Throat: Oropharynx is clear and moist. No oropharyngeal exudate.  Eyes: Pupils are equal, round, and reactive to light. Conjunctivae and EOM are normal. Right eye exhibits no discharge. Left eye exhibits no discharge. No scleral icterus.  Neck: Normal range of motion. Neck supple. No JVD present. No tracheal deviation present. No thyromegaly present.  Cardiovascular: Normal rate, regular rhythm, normal heart sounds and intact distal pulses. Exam reveals no gallop and no friction rub.  No murmur heard. Pulmonary/Chest: Effort normal and breath sounds normal. No stridor. No respiratory distress. She has no wheezes. She has no rales. She exhibits no tenderness.  Abdominal: Soft. Bowel sounds are normal. She exhibits no distension and no mass. There is no abdominal tenderness. There is no rebound and no guarding.  Musculoskeletal: Normal range of motion.        General: No tenderness or edema.  Lymphadenopathy:    She has no cervical adenopathy.  Neurological: She is alert and oriented to person, place, and time. She has normal reflexes. No cranial nerve deficit. She exhibits normal muscle tone. Coordination normal.  Skin: Skin is warm. No rash noted. She is not diaphoretic. No erythema. No pallor.  Psychiatric: She has a normal mood and affect. Her behavior is normal. Judgment and thought content normal.  Vitals reviewed.         Assessment & Plan:  The primary encounter diagnosis was Osteopenia, unspecified location. Diagnoses of General medical exam and Hyperlipidemia, unspecified hyperlipidemia type were also pertinent to this visit.  Patient's colonoscopy is up-to-date.  Her mammogram is up-to-date.  She does not require Pap smear due to her age.  Her immunizations are up-to-date except for Pneumovax 23.  Patient left before I was able to give this to her this was my fault.  I will contact the patient and have her return to the office for that at her convenience.  We reviewed her lab work which was significant for dyslipidemia and I recommended increasing her aerobic exercise to compensate.  Otherwise her preventative care is up-to-date.  She denies any problems with falls, depression, memory loss or trouble performing ADLs.  Regular anticipatory guidance is provided.

## 2018-08-27 ENCOUNTER — Encounter: Payer: Self-pay | Admitting: *Deleted

## 2018-08-27 DIAGNOSIS — N951 Menopausal and female climacteric states: Secondary | ICD-10-CM | POA: Insufficient documentation

## 2018-08-27 DIAGNOSIS — M810 Age-related osteoporosis without current pathological fracture: Secondary | ICD-10-CM | POA: Insufficient documentation

## 2018-08-30 DIAGNOSIS — H2513 Age-related nuclear cataract, bilateral: Secondary | ICD-10-CM | POA: Diagnosis not present

## 2018-08-30 DIAGNOSIS — H01001 Unspecified blepharitis right upper eyelid: Secondary | ICD-10-CM | POA: Diagnosis not present

## 2018-08-30 DIAGNOSIS — H01002 Unspecified blepharitis right lower eyelid: Secondary | ICD-10-CM | POA: Diagnosis not present

## 2018-08-30 DIAGNOSIS — H01004 Unspecified blepharitis left upper eyelid: Secondary | ICD-10-CM | POA: Diagnosis not present

## 2018-08-30 DIAGNOSIS — H01005 Unspecified blepharitis left lower eyelid: Secondary | ICD-10-CM | POA: Diagnosis not present

## 2018-09-14 ENCOUNTER — Other Ambulatory Visit: Payer: Self-pay

## 2018-09-14 ENCOUNTER — Ambulatory Visit (INDEPENDENT_AMBULATORY_CARE_PROVIDER_SITE_OTHER): Payer: Medicare Other | Admitting: Family Medicine

## 2018-09-14 DIAGNOSIS — Z23 Encounter for immunization: Secondary | ICD-10-CM

## 2018-10-14 ENCOUNTER — Other Ambulatory Visit: Payer: Self-pay

## 2018-10-14 ENCOUNTER — Ambulatory Visit (INDEPENDENT_AMBULATORY_CARE_PROVIDER_SITE_OTHER): Payer: Medicare Other | Admitting: *Deleted

## 2018-10-14 DIAGNOSIS — Z23 Encounter for immunization: Secondary | ICD-10-CM | POA: Diagnosis not present

## 2018-10-14 NOTE — Progress Notes (Signed)
Patient seen in office for Influenza Vaccination.   Tolerated IM administration well.   Immunization history updated.  

## 2018-10-27 ENCOUNTER — Other Ambulatory Visit: Payer: Self-pay

## 2018-10-27 ENCOUNTER — Ambulatory Visit
Admission: RE | Admit: 2018-10-27 | Discharge: 2018-10-27 | Disposition: A | Discharge status: Home / Self Care | Payer: Medicare Other | Source: Ambulatory Visit | Attending: Family Medicine | Admitting: Family Medicine

## 2018-10-27 DIAGNOSIS — M8580 Other specified disorders of bone density and structure, unspecified site: Secondary | ICD-10-CM

## 2018-10-27 DIAGNOSIS — Z780 Asymptomatic menopausal state: Secondary | ICD-10-CM | POA: Diagnosis not present

## 2018-10-27 DIAGNOSIS — M8589 Other specified disorders of bone density and structure, multiple sites: Secondary | ICD-10-CM | POA: Diagnosis not present

## 2018-11-01 ENCOUNTER — Other Ambulatory Visit: Payer: Self-pay

## 2018-11-02 ENCOUNTER — Encounter: Payer: Self-pay | Admitting: Family Medicine

## 2018-11-02 ENCOUNTER — Ambulatory Visit (INDEPENDENT_AMBULATORY_CARE_PROVIDER_SITE_OTHER): Payer: Medicare Other | Admitting: Family Medicine

## 2018-11-02 VITALS — BP 120/72 | HR 78 | Temp 98.2°F | Resp 14 | Ht 64.5 in | Wt 134.0 lb

## 2018-11-02 DIAGNOSIS — M8580 Other specified disorders of bone density and structure, unspecified site: Secondary | ICD-10-CM

## 2018-11-02 DIAGNOSIS — M7061 Trochanteric bursitis, right hip: Secondary | ICD-10-CM | POA: Diagnosis not present

## 2018-11-02 NOTE — Progress Notes (Signed)
Subjective:    Patient ID: Brittany House, female    DOB: 1950/12/12, 68 y.o.   MRN: [id number]  HPI Patient is here today for 2 concerns.  First she wants to discuss her bone density test.  She was confused by the report.  She was diagnosed with osteopenia in 2018 when she had a T score of -2.1 in her spine and -2.1 in her left hip.  She is currently taking calcium but she is not sure how much to take and she is also unsure of how much vitamin D to take.  On her most recent bone density test, the T score in her spine worsened to -2.3, her left hip worsened to -2.2, and her right hip worse than the -2.1.  She still has osteopenia and I recommended 1200 mg a day of calcium and 1000 units a day of vitamin D.  She was uncertain of what the numbers meant and wanted to discuss this in detail.  She also complains of pain in her right hip.  The pain is located on the lateral aspect of the right hip over the greater trochanter.  3 or 4 nights a week it hurts for her to sleep on her side.  It aches and throbs.  She has no anterior posterior hip pain.  She has no pain with walking or standing.  There is no pain with flexion or extension of the hip or internal or external rotation Past Medical History:  Diagnosis Date  . Cancer (HCC)    breast  . GERD (gastroesophageal reflux disease)   . Hyperlipidemia   . Osteopenia   . Osteoporosis    Past Surgical History:  Procedure Laterality Date  . c-sections x 3    . HERNIA REPAIR    . MASTECTOMY     Current Outpatient Medications on File Prior to Visit  Medication Sig Dispense Refill  . calcium carbonate (OS-CAL) 600 MG TABS tablet Take 600 mg by mouth 2 (two) times daily with a meal.    . fluticasone (FLONASE) 50 MCG/ACT nasal spray SHAKE LIQUID AND USE 2 SPRAYS IN EACH NOSTRIL DAILY 16 g 6  . metroNIDAZOLE (METROCREAM) 0.75 % cream Apply 1 application topically 2 (two) times daily.    . Multiple Vitamin (MULTIVITAMIN) tablet Take 1 tablet by mouth daily.      No current facility-administered medications on file prior to visit.    Allergies  Allergen Reactions  . Penicillins Swelling and Rash    Has patient had a PCN reaction causing immediate rash, facial/tongue/throat swelling, SOB or lightheadedness with hypotension: Yes Has patient had a PCN reaction causing severe rash involving mucus membranes or skin necrosis: Yes Has patient had a PCN reaction that required hospitalization: No Has patient had a PCN reaction occurring within the last 10 years: No If all of the above answers are "NO", then may proceed with Cephalosporin use.    Social History   Socioeconomic History  . Marital status: Married    Spouse name: Not on file  . Number of children: Not on file  . Years of education: Not on file  . Highest education level: Not on file  Occupational History  . Not on file  Social Needs  . Financial resource strain: Not on file  . Food insecurity    Worry: Not on file    Inability: Not on file  . Transportation needs    Medical: Not on file    Non-medical: Not on file  Tobacco Use  . Smoking status: Never Smoker  . Smokeless tobacco: Never Used  Substance and Sexual Activity  . Alcohol use: Yes    Comment: Rare  . Drug use: No  . 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  Lifestyle  . Physical activity    Days per week: Not on file    Minutes per session: Not on file  . Stress: Not on file  Relationships  . Social Herbalist on phone: Not on file    Gets together: Not on file    Attends religious service: Not on file    Active member of club or organization: Not on file    Attends meetings of clubs or organizations: Not on file    Relationship status: Not on file  . Intimate partner violence    Fear of current or ex partner: Not on file    Emotionally abused: Not on file    Physically abused: Not on file    Forced sexual activity: Not on file  Other Topics Concern  . Not on  file  Social History Narrative  . Not on file      Review of Systems  All other systems reviewed and are negative.      Objective:   Physical Exam Vitals signs reviewed.  Cardiovascular:     Rate and Rhythm: Normal rate and regular rhythm.  Pulmonary:     Effort: Pulmonary effort is normal.     Breath sounds: Normal breath sounds.  Musculoskeletal:     Right hip: She exhibits tenderness and bony tenderness. She exhibits normal range of motion, normal strength, no swelling, no crepitus and no deformity.       Legs:           Assessment & Plan:  Osteopenia, unspecified location  Greater trochanteric bursitis of right hip  Spent 15 minutes with the patient explaining the results of her bone density test and what this means.  Recommended 1200 mg a day of calcium and 1000 units a day of vitamin D.  Recheck bone density test in 2022.  Also recommended weightbearing exercise.  She also has greater trochanteric bursitis of the right hip.  Recommended NSAIDs.  If this does not help, the patient to return for cortisone injection for bursitis.

## 2018-11-29 DIAGNOSIS — R102 Pelvic and perineal pain: Secondary | ICD-10-CM | POA: Diagnosis not present

## 2018-12-10 DIAGNOSIS — Z1231 Encounter for screening mammogram for malignant neoplasm of breast: Secondary | ICD-10-CM | POA: Diagnosis not present

## 2018-12-10 DIAGNOSIS — Z853 Personal history of malignant neoplasm of breast: Secondary | ICD-10-CM | POA: Diagnosis not present

## 2018-12-14 ENCOUNTER — Other Ambulatory Visit: Payer: Self-pay | Admitting: Obstetrics and Gynecology

## 2018-12-14 DIAGNOSIS — R928 Other abnormal and inconclusive findings on diagnostic imaging of breast: Secondary | ICD-10-CM

## 2018-12-21 ENCOUNTER — Other Ambulatory Visit: Payer: Self-pay

## 2018-12-21 ENCOUNTER — Ambulatory Visit
Admission: RE | Admit: 2018-12-21 | Discharge: 2018-12-21 | Disposition: A | Discharge status: Home / Self Care | Payer: Medicare Other | Source: Ambulatory Visit | Attending: Obstetrics and Gynecology | Admitting: Obstetrics and Gynecology

## 2018-12-21 ENCOUNTER — Other Ambulatory Visit: Payer: Medicare Other

## 2018-12-21 DIAGNOSIS — N6313 Unspecified lump in the right breast, lower outer quadrant: Secondary | ICD-10-CM | POA: Diagnosis not present

## 2018-12-21 DIAGNOSIS — R928 Other abnormal and inconclusive findings on diagnostic imaging of breast: Secondary | ICD-10-CM

## 2018-12-21 LAB — HM MAMMOGRAPHY

## 2019-01-06 ENCOUNTER — Encounter: Payer: Self-pay | Admitting: *Deleted

## 2019-02-14 ENCOUNTER — Ambulatory Visit (INDEPENDENT_AMBULATORY_CARE_PROVIDER_SITE_OTHER): Payer: Medicare Other | Admitting: Family Medicine

## 2019-02-14 ENCOUNTER — Other Ambulatory Visit: Payer: Self-pay

## 2019-02-14 ENCOUNTER — Encounter: Payer: Self-pay | Admitting: Family Medicine

## 2019-02-14 VITALS — BP 110/68 | HR 77 | Temp 96.7°F | Resp 16 | Ht 64.5 in | Wt 135.0 lb

## 2019-02-14 DIAGNOSIS — M6740 Ganglion, unspecified site: Secondary | ICD-10-CM | POA: Diagnosis not present

## 2019-02-14 NOTE — Progress Notes (Signed)
Subjective:    Patient ID: Brittany House, female    DOB: Sep 05, 1950, 69 y.o.   MRN: [id number]  HPI Patient reports a 1 week history of a painful nodule on her left hand over the palmar aspect of the first MCP joint.  It is freely mobile under the skin and not adherent to the bone.  Patient denies any trigger finger in the thumb.  She can freely flex and extend her MCP and IP joints without any pain.  However the nodule is tender to palpation.  It is cystlike in consistency.  However it does not seem to be attached to the joint.  Differential diagnosis includes ganglion cyst/mucous cyst/inflammatory nodule trigger finger.  I favor a cyst.  Patient would like this treated.[id number] Past Surgical History:  Procedure Laterality Date  . c-sections x 3    . HERNIA REPAIR    . MASTECTOMY     Current Outpatient Medications on File Prior to Visit  Medication Sig Dispense Refill  . calcium carbonate (OS-CAL) 600 MG TABS tablet Take 600 mg by mouth 2 (two) times daily with a meal.    . fluticasone (FLONASE) 50 MCG/ACT nasal spray SHAKE LIQUID AND USE 2 SPRAYS IN EACH NOSTRIL DAILY 16 g 6  . metroNIDAZOLE (METROCREAM) 0.75 % cream Apply 1 application topically 2 (two) times daily.    . Multiple Vitamin (MULTIVITAMIN) tablet Take 1 tablet by mouth daily.     No current facility-administered medications on file prior to visit.   Allergies  Allergen Reactions  . Penicillins Swelling and Rash    Has patient had a PCN reaction causing immediate rash, facial/tongue/throat swelling, SOB or lightheadedness with hypotension: Yes Has patient had a PCN reaction causing severe rash involving mucus membranes or skin necrosis: Yes Has patient had a PCN reaction that required hospitalization: No Has patient had a PCN reaction occurring within the last 10 years: No If all of the above answers are "NO", then may proceed with Cephalosporin use.    Social History   Socioeconomic History  . Marital status: Married   Spouse name: Not on file  . Number of children: Not on file  . Years of education: Not on file  . Highest education level: Not on file  Occupational History  . Not on file  Tobacco Use  . Smoking status: Never Smoker  . Smokeless tobacco: Never Used  Substance and Sexual Activity  . Alcohol use: Yes    Comment: Rare  . Drug use: No  . 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  Other Topics Concern  . Not on file  Social History Narrative  . Not on file   Social Determinants of Health   Financial Resource Strain:   . Difficulty of Paying Living Expenses: Not on file  Food Insecurity:   . Worried About Charity fundraiser in the Last Year: Not on file  . Ran Out of Food in the Last Year: Not on file  Transportation Needs:   . Lack of Transportation (Medical): Not on file  . Lack of Transportation (Non-Medical): Not on file  Physical Activity:   . Days of Exercise per Week: Not on file  . Minutes of Exercise per Session: Not on file  Stress:   . Feeling of Stress : Not on file  Social Connections:   . Frequency of Communication with Friends and Family: Not on file  . Frequency of Social Gatherings with Friends and  Family: Not on file  . Attends Religious Services: Not on file  . Active Member of Clubs or Organizations: Not on file  . Attends Archivist Meetings: Not on file  . Marital Status: Not on file  Intimate Partner Violence:   . Fear of Current or Ex-Partner: Not on file  . Emotionally Abused: Not on file  . Physically Abused: Not on file  . Sexually Abused: Not on file   ]   Review of Systems  All other systems reviewed and are negative.      Objective:   Physical Exam Vitals reviewed.  Constitutional:      Appearance: Normal appearance.  Cardiovascular:     Rate and Rhythm: Normal rate and regular rhythm.  Pulmonary:     Effort: Pulmonary effort is normal.  Musculoskeletal:     Left hand: Deformity and  tenderness present. No bony tenderness. Normal range of motion. Normal strength. Normal sensation.  Neurological:     Mental Status: She is alert.           Assessment & Plan:  Ganglion cyst  Differential diagnosis includes ganglion cyst versus soft tissue tumor versus inflammatory nodule due to trigger finger.  I favor a ganglion cyst.  Patient would like to see a hand surgeon to have this removed or aspirated or injected which I will gladly arrange.

## 2019-02-18 ENCOUNTER — Encounter: Payer: Self-pay | Admitting: Family Medicine

## 2019-02-23 DIAGNOSIS — R2232 Localized swelling, mass and lump, left upper limb: Secondary | ICD-10-CM | POA: Insufficient documentation

## 2019-02-23 DIAGNOSIS — M19041 Primary osteoarthritis, right hand: Secondary | ICD-10-CM | POA: Insufficient documentation

## 2019-02-23 DIAGNOSIS — M180 Bilateral primary osteoarthritis of first carpometacarpal joints: Secondary | ICD-10-CM | POA: Diagnosis not present

## 2019-02-23 DIAGNOSIS — M19042 Primary osteoarthritis, left hand: Secondary | ICD-10-CM | POA: Diagnosis not present

## 2019-02-23 DIAGNOSIS — M79642 Pain in left hand: Secondary | ICD-10-CM | POA: Diagnosis not present

## 2019-02-23 DIAGNOSIS — M79641 Pain in right hand: Secondary | ICD-10-CM | POA: Diagnosis not present

## 2019-03-04 ENCOUNTER — Ambulatory Visit: Payer: Medicare Other

## 2019-03-12 ENCOUNTER — Ambulatory Visit: Payer: Medicare Other | Attending: Internal Medicine

## 2019-03-12 DIAGNOSIS — Z23 Encounter for immunization: Secondary | ICD-10-CM | POA: Insufficient documentation

## 2019-03-12 NOTE — Progress Notes (Signed)
   Covid-19 Vaccination Clinic  Name:  Brittany House    MRN: [id number] DOB: 16-Jun-1950  03/12/2019  [name] was observed post Covid-19 immunization for 15 minutes without incidence. She was provided with Vaccine Information Sheet and instruction to access the V-Safe system.   [name] was instructed to call 911 with any severe reactions post vaccine: Marland Kitchen Difficulty breathing  . Swelling of your face and throat  . A fast heartbeat  . A bad rash all over your body  . Dizziness and weakness    Immunizations Administered    Name Date Dose VIS Date Route   Pfizer COVID-19 Vaccine 03/12/2019  4:15 PM 0.3 mL 01/14/2019 Intramuscular   Manufacturer: [place]   Lot: [id number]   [place]: [id number]

## 2019-04-06 ENCOUNTER — Ambulatory Visit: Payer: Medicare Other | Attending: Internal Medicine

## 2019-04-06 DIAGNOSIS — Z23 Encounter for immunization: Secondary | ICD-10-CM | POA: Insufficient documentation

## 2019-04-06 NOTE — Progress Notes (Signed)
   Covid-19 Vaccination Clinic  Name:  Brittany House    MRN: [id number] DOB: 1950-12-04  04/06/2019  Brittany House was observed post Covid-19 immunization for 15 minutes without incident. She was provided with Vaccine Information Sheet and instruction to access the V-Safe system.   Brittany House was instructed to call 911 with any severe reactions post vaccine: Marland Kitchen Difficulty breathing  . Swelling of face and throat  . A fast heartbeat  . A bad rash all over body  . Dizziness and weakness   Immunizations Administered    Name Date Dose VIS Date Route   Pfizer COVID-19 Vaccine 04/06/2019  1:01 PM 0.3 mL 01/14/2019 Intramuscular   Manufacturer: [place]   Lot: [id number]   [place]: [id number]

## 2019-04-11 DIAGNOSIS — M7661 Achilles tendinitis, right leg: Secondary | ICD-10-CM | POA: Diagnosis not present

## 2019-04-11 DIAGNOSIS — M21612 Bunion of left foot: Secondary | ICD-10-CM | POA: Diagnosis not present

## 2019-04-11 DIAGNOSIS — M21611 Bunion of right foot: Secondary | ICD-10-CM | POA: Diagnosis not present

## 2019-04-25 DIAGNOSIS — M722 Plantar fascial fibromatosis: Secondary | ICD-10-CM | POA: Diagnosis not present

## 2019-04-25 DIAGNOSIS — M21612 Bunion of left foot: Secondary | ICD-10-CM | POA: Diagnosis not present

## 2019-04-25 DIAGNOSIS — M7661 Achilles tendinitis, right leg: Secondary | ICD-10-CM | POA: Diagnosis not present

## 2019-04-25 DIAGNOSIS — M21611 Bunion of right foot: Secondary | ICD-10-CM | POA: Diagnosis not present

## 2019-04-28 ENCOUNTER — Ambulatory Visit: Payer: Medicare Other | Admitting: Family Medicine

## 2019-05-02 DIAGNOSIS — S96911D Strain of unspecified muscle and tendon at ankle and foot level, right foot, subsequent encounter: Secondary | ICD-10-CM | POA: Diagnosis not present

## 2019-05-02 DIAGNOSIS — M7661 Achilles tendinitis, right leg: Secondary | ICD-10-CM | POA: Diagnosis not present

## 2019-05-02 DIAGNOSIS — M21612 Bunion of left foot: Secondary | ICD-10-CM | POA: Diagnosis not present

## 2019-05-02 DIAGNOSIS — M21611 Bunion of right foot: Secondary | ICD-10-CM | POA: Diagnosis not present

## 2019-05-16 DIAGNOSIS — M7661 Achilles tendinitis, right leg: Secondary | ICD-10-CM | POA: Diagnosis not present

## 2019-05-16 DIAGNOSIS — M21611 Bunion of right foot: Secondary | ICD-10-CM | POA: Diagnosis not present

## 2019-05-16 DIAGNOSIS — M722 Plantar fascial fibromatosis: Secondary | ICD-10-CM | POA: Diagnosis not present

## 2019-05-16 DIAGNOSIS — M21612 Bunion of left foot: Secondary | ICD-10-CM | POA: Diagnosis not present

## 2019-06-06 DIAGNOSIS — M21611 Bunion of right foot: Secondary | ICD-10-CM | POA: Diagnosis not present

## 2019-06-06 DIAGNOSIS — M62479 Contracture of muscle, unspecified ankle and foot: Secondary | ICD-10-CM | POA: Diagnosis not present

## 2019-06-06 DIAGNOSIS — M21612 Bunion of left foot: Secondary | ICD-10-CM | POA: Diagnosis not present

## 2019-06-06 DIAGNOSIS — M722 Plantar fascial fibromatosis: Secondary | ICD-10-CM | POA: Diagnosis not present

## 2019-06-06 DIAGNOSIS — M7661 Achilles tendinitis, right leg: Secondary | ICD-10-CM | POA: Diagnosis not present

## 2019-06-07 DIAGNOSIS — L301 Dyshidrosis [pompholyx]: Secondary | ICD-10-CM | POA: Diagnosis not present

## 2019-06-07 DIAGNOSIS — L820 Inflamed seborrheic keratosis: Secondary | ICD-10-CM | POA: Diagnosis not present

## 2019-06-17 ENCOUNTER — Encounter: Payer: Self-pay | Admitting: Family Medicine

## 2019-06-17 ENCOUNTER — Ambulatory Visit (INDEPENDENT_AMBULATORY_CARE_PROVIDER_SITE_OTHER): Payer: Medicare Other | Admitting: Family Medicine

## 2019-06-17 ENCOUNTER — Other Ambulatory Visit: Payer: Self-pay

## 2019-06-17 VITALS — BP 124/80 | HR 68 | Temp 96.9°F | Resp 16 | Ht 64.5 in | Wt 138.0 lb

## 2019-06-17 DIAGNOSIS — M722 Plantar fascial fibromatosis: Secondary | ICD-10-CM
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
## 2019-06-27 DIAGNOSIS — M21612 Bunion of left foot: Secondary | ICD-10-CM | POA: Diagnosis not present

## 2019-06-27 DIAGNOSIS — M21611 Bunion of right foot: Secondary | ICD-10-CM | POA: Diagnosis not present

## 2019-06-27 DIAGNOSIS — M7661 Achilles tendinitis, right leg: Secondary | ICD-10-CM | POA: Diagnosis not present

## 2019-06-27 DIAGNOSIS — M62479 Contracture of muscle, unspecified ankle and foot: Secondary | ICD-10-CM | POA: Diagnosis not present

## 2019-06-27 DIAGNOSIS — M722 Plantar fascial fibromatosis: Secondary | ICD-10-CM | POA: Diagnosis not present

## 2019-06-28 ENCOUNTER — Telehealth (INDEPENDENT_AMBULATORY_CARE_PROVIDER_SITE_OTHER): Payer: Medicare Other | Admitting: Family Medicine

## 2019-06-28 ENCOUNTER — Other Ambulatory Visit: Payer: Self-pay

## 2019-06-28 DIAGNOSIS — J301 Allergic rhinitis due to pollen: Secondary | ICD-10-CM

## 2019-06-28 MED ORDER — LORATADINE 10 MG PO TABS: 10.0000 mg | ORAL_TABLET | Freq: Every day | ORAL | 11 refills | Status: DC

## 2019-06-28 MED ORDER — PREDNISONE 20 MG PO TABS: ORAL_TABLET | ORAL | 0 refills | Status: DC

## 2019-06-28 NOTE — Progress Notes (Signed)
Subjective:     Patient ID: Brittany House, female   DOB: 01/28/1951, 69 y.o.   MRN: [id number]  HPI  Patient is being seen today as a telephone visit.  Patient consents to be seen via telephone.  Phone call began at 2:00.  Phone call concluded at 211.  Symptoms began 5 days ago.  Patient reports pressure in her sinuses.  She reports postnasal drip.  She states that her face hurts particularly her cheeks and behind her eyes.  She has a barking cough due to the postnasal drip.  She has chest congestion.  She denies any fever.  She denies any purulent nasal drainage.  She denies any pain in her teeth.  She denies any sore throat.  She has been taking Flonase with no relief.  She has not been taking antihistamine.  In the past she tried Xyzal however it made her itch.  She denies any shortness of breath or chest pain. Past Medical History:  Diagnosis Date  . Cancer (HCC)    breast  . GERD (gastroesophageal reflux disease)   . Hyperlipidemia   . Osteopenia   . Osteoporosis    Past Surgical History:  Procedure Laterality Date  . c-sections x 3    . HERNIA REPAIR    . MASTECTOMY     Current Outpatient Medications on File Prior to Visit  Medication Sig Dispense Refill  . calcium carbonate (OS-CAL) 600 MG TABS tablet Take 600 mg by mouth 2 (two) times daily with a meal.    . fluticasone (FLONASE) 50 MCG/ACT nasal spray SHAKE LIQUID AND USE 2 SPRAYS IN EACH NOSTRIL DAILY 16 g 6  . metroNIDAZOLE (METROCREAM) 0.75 % cream Apply 1 application topically 2 (two) times daily.    . Multiple Vitamin (MULTIVITAMIN) tablet Take 1 tablet by mouth daily.     No current facility-administered medications on file prior to visit.   Allergies  Allergen Reactions  . Penicillins Swelling and Rash    Has patient had a PCN reaction causing immediate rash, facial/tongue/throat swelling, SOB or lightheadedness with hypotension: Yes Has patient had a PCN reaction causing severe rash involving mucus membranes or skin  necrosis: Yes Has patient had a PCN reaction that required hospitalization: No Has patient had a PCN reaction occurring within the last 10 years: No If all of the above answers are "NO", then may proceed with Cephalosporin use.    Social History   Socioeconomic History  . Marital status: Married    Spouse name: Not on file  . Number of children: Not on file  . Years of education: Not on file  . Highest education level: Not on file  Occupational History  . Not on file  Tobacco Use  . Smoking status: Never Smoker  . Smokeless tobacco: Never Used  Substance and Sexual Activity  . Alcohol use: Yes    Comment: Rare  . Drug use: No  . 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  Other Topics Concern  . Not on file  Social History Narrative  . Not on file   Social Determinants of Health   Financial Resource Strain:   . Difficulty of Paying Living Expenses:   Food Insecurity:   . Worried About Charity fundraiser in the Last Year:   . Arboriculturist in the Last Year:   Transportation Needs:   . Film/video editor (Medical):   Marland Kitchen Lack of Transportation (Non-Medical):  Physical Activity:   . Days of Exercise per Week:   . Minutes of Exercise per Session:   Stress:   . Feeling of Stress :   Social Connections:   . Frequency of Communication with Friends and Family:   . Frequency of Social Gatherings with Friends and Family:   . Attends Religious Services:   . Active Member of Clubs or Organizations:   . Attends Archivist Meetings:   Marland Kitchen Marital Status:   Intimate Partner Violence:   . Fear of Current or Ex-Partner:   . Emotionally Abused:   Marland Kitchen Physically Abused:   . Sexually Abused:     Review of Systems  All other systems reviewed and are negative.      Objective:   Physical Exam     Assessment:     Seasonal allergic rhinitis due to pollen    Plan:     Symptoms: Sound consistent with sinusitis due to allergies.   Symptoms are severe.  Therefore I will start the patient on a prednisone taper pack initially to calm down the irritation in her sinuses.  I also recommended she begin Claritin 10 mg a day to maintain control after the prednisone is discontinued.  Monitor for the development of a secondary sinus infection.  Patient will call me back if no better in 6 days or sooner if worsening.

## 2019-08-12 ENCOUNTER — Other Ambulatory Visit: Payer: Medicare Other

## 2019-08-12 DIAGNOSIS — E785 Hyperlipidemia, unspecified: Secondary | ICD-10-CM | POA: Diagnosis not present

## 2019-08-12 DIAGNOSIS — D6489 Other specified anemias: Secondary | ICD-10-CM | POA: Diagnosis not present

## 2019-08-12 DIAGNOSIS — E559 Vitamin D deficiency, unspecified: Secondary | ICD-10-CM | POA: Diagnosis not present

## 2019-08-12 DIAGNOSIS — Z130 Encounter for screening for diseases of the blood and blood-forming organs and certain disorders involving the immune mechanism: Secondary | ICD-10-CM

## 2019-08-12 DIAGNOSIS — Z0000 Encounter for general adult medical examination without abnormal findings: Secondary | ICD-10-CM

## 2019-08-13 LAB — CBC WITH DIFFERENTIAL/PLATELET
Absolute Monocytes: 552 cells/uL (ref 200–950)
Basophils Absolute: 51 cells/uL (ref 0–200)
Basophils Relative: 1.1 %
Eosinophils Absolute: 129 cells/uL (ref 15–500)
Eosinophils Relative: 2.8 %
HCT: 41.2 % (ref 35.0–45.0)
Hemoglobin: 13.9 g/dL (ref 11.7–15.5)
Lymphs Abs: 1776 cells/uL (ref 850–3900)
MCH: 31.6 pg (ref 27.0–33.0)
MCHC: 33.7 g/dL (ref 32.0–36.0)
MCV: 93.6 fL (ref 80.0–100.0)
MPV: 11.1 fL (ref 7.5–12.5)
Monocytes Relative: 12 %
Neutro Abs: 2093 cells/uL (ref 1500–7800)
Neutrophils Relative %: 45.5 %
Platelets: 229 10*3/uL (ref 140–400)
RBC: 4.4 10*6/uL (ref 3.80–5.10)
RDW: 11.9 % (ref 11.0–15.0)
Total Lymphocyte: 38.6 %
WBC: 4.6 10*3/uL (ref 3.8–10.8)

## 2019-08-13 LAB — COMPLETE METABOLIC PANEL WITH GFR
AG Ratio: 2.4 (calc) (ref 1.0–2.5)
ALT: 16 U/L (ref 6–29)
AST: 17 U/L (ref 10–35)
Albumin: 4.3 g/dL (ref 3.6–5.1)
Alkaline phosphatase (APISO): 56 U/L (ref 37–153)
BUN: 23 mg/dL (ref 7–25)
CO2: 27 mmol/L (ref 20–32)
Calcium: 9 mg/dL (ref 8.6–10.4)
Chloride: 106 mmol/L (ref 98–110)
Creat: 0.76 mg/dL (ref 0.50–0.99)
GFR, Est African American: 94 mL/min/{1.73_m2} (ref >=60)
GFR, Est Non African American: 81 mL/min/{1.73_m2} (ref >=60)
Globulin: 1.8 g/dL (calc) — ABNORMAL LOW (ref 1.9–3.7)
Glucose, Bld: 84 mg/dL (ref 65–99)
Potassium: 4.3 mmol/L (ref 3.5–5.3)
Sodium: 141 mmol/L (ref 135–146)
Total Bilirubin: 1.1 mg/dL (ref 0.2–1.2)
Total Protein: 6.1 g/dL (ref 6.1–8.1)

## 2019-08-13 LAB — LIPID PANEL
Cholesterol: 205 mg/dL — ABNORMAL HIGH (ref <200)
HDL: 45 mg/dL — ABNORMAL LOW (ref >=50)
LDL Cholesterol (Calc): 132 mg/dL (calc) — ABNORMAL HIGH
Non-HDL Cholesterol (Calc): 160 mg/dL (calc) — ABNORMAL HIGH (ref <130)
Total CHOL/HDL Ratio: 4.6 (calc) (ref <5.0)
Triglycerides: 149 mg/dL (ref <150)

## 2019-08-13 LAB — VITAMIN D 25 HYDROXY (VIT D DEFICIENCY, FRACTURES): Vit D, 25-Hydroxy: 34 ng/mL (ref 30–100)

## 2019-08-17 DIAGNOSIS — M722 Plantar fascial fibromatosis: Secondary | ICD-10-CM | POA: Diagnosis not present

## 2019-08-17 DIAGNOSIS — M79671 Pain in right foot: Secondary | ICD-10-CM | POA: Diagnosis not present

## 2019-08-19 DIAGNOSIS — M722 Plantar fascial fibromatosis: Secondary | ICD-10-CM | POA: Diagnosis not present

## 2019-08-19 DIAGNOSIS — M79671 Pain in right foot: Secondary | ICD-10-CM | POA: Diagnosis not present

## 2019-08-25 DIAGNOSIS — M79671 Pain in right foot: Secondary | ICD-10-CM | POA: Diagnosis not present

## 2019-08-25 DIAGNOSIS — M722 Plantar fascial fibromatosis: Secondary | ICD-10-CM | POA: Diagnosis not present

## 2019-08-29 ENCOUNTER — Encounter: Payer: Medicare Other | Admitting: Family Medicine

## 2019-09-12 DIAGNOSIS — M79671 Pain in right foot: Secondary | ICD-10-CM | POA: Diagnosis not present

## 2019-09-12 DIAGNOSIS — M722 Plantar fascial fibromatosis: Secondary | ICD-10-CM | POA: Diagnosis not present

## 2019-09-14 DIAGNOSIS — M722 Plantar fascial fibromatosis: Secondary | ICD-10-CM | POA: Diagnosis not present

## 2019-09-14 DIAGNOSIS — M79671 Pain in right foot: Secondary | ICD-10-CM | POA: Diagnosis not present

## 2019-09-19 DIAGNOSIS — M79671 Pain in right foot: Secondary | ICD-10-CM | POA: Diagnosis not present

## 2019-09-19 DIAGNOSIS — M722 Plantar fascial fibromatosis: Secondary | ICD-10-CM | POA: Diagnosis not present

## 2019-09-20 ENCOUNTER — Other Ambulatory Visit: Payer: Self-pay

## 2019-09-20 ENCOUNTER — Ambulatory Visit (INDEPENDENT_AMBULATORY_CARE_PROVIDER_SITE_OTHER): Payer: Medicare Other | Admitting: Family Medicine

## 2019-09-20 VITALS — BP 138/80 | HR 76 | Temp 95.1°F | Wt 138.0 lb

## 2019-09-20 DIAGNOSIS — Z0001 Encounter for general adult medical examination with abnormal findings: Secondary | ICD-10-CM

## 2019-09-20 DIAGNOSIS — E785 Hyperlipidemia, unspecified: Secondary | ICD-10-CM

## 2019-09-20 DIAGNOSIS — M8580 Other specified disorders of bone density and structure, unspecified site: Secondary | ICD-10-CM | POA: Diagnosis not present

## 2019-09-20 DIAGNOSIS — Z0000 Encounter for general adult medical examination without abnormal findings: Secondary | ICD-10-CM

## 2019-09-20 NOTE — Progress Notes (Signed)
Subjective:    Patient ID: Brittany House, female    DOB: Apr 26, 1950, 69 y.o.   MRN: [id number]  HPI Patient is here today for complete physical exam.  Patient's mammogram was in 12/2018 and was normal.  Her Pap smear was last performed in 2017 and was normal.  Due to her age she does not require another.  Her last bone density test was in 2020 and showed osteopenia with a T score of -2.3 in the spine.  Her colonoscopy was performed in 2014 and was normal and is not due again until 2024.  Her immunization records are listed below: Immunization History  Administered Date(s) Administered  . Fluad Quad(high Dose 65+) 10/14/2018  . Influenza Split 12/18/2011  . Influenza,inj,Quad PF,6+ Mos 11/16/2012, 12/02/2013, 12/26/2014, 11/29/2015, 11/12/2016, 11/30/2017  . Influenza-Unspecified 11/03/2016  . PFIZER SARS-COV-2 Vaccination 03/12/2019, 04/06/2019  . Pneumococcal Conjugate-13 09/28/2017  . Pneumococcal Polysaccharide-23 09/14/2018  . Tdap 09/15/2011  . Zoster 07/26/2012  . Zoster Recombinat (Shingrix) 02/23/2018  Over the last few days, she has been having nosebleeds from both nostrils.  On examination, I can see areas of epistaxis along the anterior nasal septum bilaterally left greater than right.  I offered the patient silver nitrate chemical cautery and she requested that I do this to stop the nosebleeds.  Otherwise she has been doing well with no concerns.  Her most recent lab work is listed below.  Her 10-year risk of cardiovascular disease based on her blood pressure today is 8.6%.  We discussed the risk and benefits of statins and the patient elects not to take a statin at the present time. No visits with results within 1 Month(s) from this visit.  Latest known visit with results is:  Lab on 08/12/2019  Component Date Value Ref Range Status  . Cholesterol 08/12/2019 205* <200 mg/dL Final  . HDL 08/12/2019 45* > OR = 50 mg/dL Final  . Triglycerides 08/12/2019 149  <150 mg/dL Final  .  LDL Cholesterol (Calc) 08/12/2019 132* mg/dL (calc) Final   Comment: Reference range: <100 . Desirable range <100 mg/dL for primary prevention;   <70 mg/dL for patients with CHD or diabetic patients  with > or = 2 CHD risk factors. Marland Kitchen LDL-C is now calculated using the Martin-Hopkins  calculation, which is a validated novel method providing  better accuracy than the Friedewald equation in the  estimation of LDL-C.  Cresenciano Genre et al. Annamaria Helling. 8469;629(52): 2061-2068  (http://education.[facility].com/faq/FAQ164)   . Total CHOL/HDL Ratio 08/12/2019 4.6  <5.0 (calc) Final  . Non-HDL Cholesterol (Calc) 08/12/2019 160* <130 mg/dL (calc) Final   Comment: For patients with diabetes plus 1 major ASCVD risk  factor, treating to a non-HDL-C goal of <100 mg/dL  (LDL-C of <70 mg/dL) is considered a therapeutic  option.   . Glucose, Bld 08/12/2019 84  65 - 99 mg/dL Final   Comment: .            Fasting reference interval .   . BUN 08/12/2019 23  7 - 25 mg/dL Final  . Creat 08/12/2019 0.76  0.50 - 0.99 mg/dL Final   Comment: For patients >5 years of age, the reference limit for Creatinine is approximately 13% higher for people identified as African-American. .   . GFR, Est Non African American 08/12/2019 81  > OR = 60 mL/min/1.56m2 Final  . GFR, Est African American 08/12/2019 94  > OR = 60 mL/min/1.92m2 Final  . BUN/Creatinine Ratio 84/13/2440 NOT APPLICABLE  6 - 22 (calc)  Final  . Sodium 08/12/2019 141  135 - 146 mmol/L Final  . Potassium 08/12/2019 4.3  3.5 - 5.3 mmol/L Final  . Chloride 08/12/2019 106  98 - 110 mmol/L Final  . CO2 08/12/2019 27  20 - 32 mmol/L Final  . Calcium 08/12/2019 9.0  8.6 - 10.4 mg/dL Final  . Total Protein 08/12/2019 6.1  6.1 - 8.1 g/dL Final  . Albumin 08/12/2019 4.3  3.6 - 5.1 g/dL Final  . Globulin 08/12/2019 1.8* 1.9 - 3.7 g/dL (calc) Final  . AG Ratio 08/12/2019 2.4  1.0 - 2.5 (calc) Final  . Total Bilirubin 08/12/2019 1.1  0.2 - 1.2 mg/dL Final  .  Alkaline phosphatase (APISO) 08/12/2019 56  37 - 153 U/L Final  . AST 08/12/2019 17  10 - 35 U/L Final  . ALT 08/12/2019 16  6 - 29 U/L Final  . Vit D, 25-Hydroxy 08/12/2019 34  30 - 100 ng/mL Final   Comment: Vitamin D Status         25-OH Vitamin D: . Deficiency:                    <20 ng/mL Insufficiency:             20 - 29 ng/mL Optimal:                 > or = 30 ng/mL . For 25-OH Vitamin D testing on patients on  D2-supplementation and patients for whom quantitation  of D2 and D3 fractions is required, the [facility](TM) 25-OH VIT D, (D2,D3), LC/MS/MS is recommended: order  code [phone] (patients >26yrs). See Note 1 . Note 1 . For additional information, please refer to  http://education.[facility].com/faq/FAQ199  (This link is being provided for informational/ educational purposes only.)   . WBC 08/12/2019 4.6  3.8 - 10.8 Thousand/uL Final  . RBC 08/12/2019 4.40  3.80 - 5.10 Million/uL Final  . Hemoglobin 08/12/2019 13.9  11.7 - 15.5 g/dL Final  . HCT 08/12/2019 41.2  35 - 45 % Final  . MCV 08/12/2019 93.6  80.0 - 100.0 fL Final  . MCH 08/12/2019 31.6  27.0 - 33.0 pg Final  . MCHC 08/12/2019 33.7  32.0 - 36.0 g/dL Final  . RDW 08/12/2019 11.9  11.0 - 15.0 % Final  . Platelets 08/12/2019 229  140 - 400 Thousand/uL Final  . MPV 08/12/2019 11.1  7.5 - 12.5 fL Final  . Neutro Abs 08/12/2019 2,093  1,500 - 7,800 cells/uL Final  . Lymphs Abs 08/12/2019 1,776  850 - 3,900 cells/uL Final  . Absolute Monocytes 08/12/2019 552  200 - 950 cells/uL Final  . Eosinophils Absolute 08/12/2019 129  15 - 500 cells/uL Final  . Basophils Absolute 08/12/2019 51  0 - 200 cells/uL Final  . Neutrophils Relative % 08/12/2019 45.5  % Final  . Total Lymphocyte 08/12/2019 38.6  % Final  . Monocytes Relative 08/12/2019 12.0  % Final  . Eosinophils Relative 08/12/2019 2.8  % Final  . Basophils Relative 08/12/2019 1.1  % Final     Past Medical History:  Diagnosis Date  . Cancer (HCC)     breast  . GERD (gastroesophageal reflux disease)   . Hyperlipidemia   . Osteopenia   . Osteoporosis    Past Surgical History:  Procedure Laterality Date  . BREAST SURGERY N/A    Phreesia 09/18/2019  . c-sections x 3    . HERNIA REPAIR    . MASTECTOMY  Current Outpatient Medications on File Prior to Visit  Medication Sig Dispense Refill  . calcium carbonate (OS-CAL) 600 MG TABS tablet Take 600 mg by mouth 2 (two) times daily with a meal.    . fluticasone (FLONASE) 50 MCG/ACT nasal spray SHAKE LIQUID AND USE 2 SPRAYS IN EACH NOSTRIL DAILY 16 g 6  . loratadine (CLARITIN) 10 MG tablet Take 1 tablet (10 mg total) by mouth daily. 30 tablet 11  . metroNIDAZOLE (METROCREAM) 0.75 % cream Apply 1 application topically 2 (two) times daily.    . Multiple Vitamin (MULTIVITAMIN) tablet Take 1 tablet by mouth daily.    . predniSONE (DELTASONE) 20 MG tablet 3 tabs poqday 1-2, 2 tabs poqday 3-4, 1 tab poqday 5-6 12 tablet 0   No current facility-administered medications on file prior to visit.   Allergies  Allergen Reactions  . Penicillins Swelling and Rash    Has patient had a PCN reaction causing immediate rash, facial/tongue/throat swelling, SOB or lightheadedness with hypotension: Yes Has patient had a PCN reaction causing severe rash involving mucus membranes or skin necrosis: Yes Has patient had a PCN reaction that required hospitalization: No Has patient had a PCN reaction occurring within the last 10 years: No If all of the above answers are "NO", then may proceed with Cephalosporin use.    Social History   Socioeconomic History  . Marital status: Married    Spouse name: Not on file  . Number of children: Not on file  . Years of education: Not on file  . Highest education level: Not on file  Occupational History  . Not on file  Tobacco Use  . Smoking status: Never Smoker  . Smokeless tobacco: Never Used  Substance and Sexual Activity  . Alcohol use: Yes    Comment: Rare    . Drug use: No  . 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  Other Topics Concern  . Not on file  Social History Narrative  . Not on file   Social Determinants of Health   Financial Resource Strain:   . Difficulty of Paying Living Expenses:   Food Insecurity:   . Worried About Charity fundraiser in the Last Year:   . Arboriculturist in the Last Year:   Transportation Needs:   . Film/video editor (Medical):   Marland Kitchen Lack of Transportation (Non-Medical):   Physical Activity:   . Days of Exercise per Week:   . Minutes of Exercise per Session:   Stress:   . Feeling of Stress :   Social Connections:   . Frequency of Communication with Friends and Family:   . Frequency of Social Gatherings with Friends and Family:   . Attends Religious Services:   . Active Member of Clubs or Organizations:   . Attends Archivist Meetings:   Marland Kitchen Marital Status:   Intimate Partner Violence:   . Fear of Current or Ex-Partner:   . Emotionally Abused:   Marland Kitchen Physically Abused:   . Sexually Abused:    Family History  Problem Relation Age of Onset  . Heart disease Neg Hx   . Cancer Neg Hx       Review of Systems  All other systems reviewed and are negative.      Objective:   Physical Exam Vitals reviewed.  Constitutional:      General: She is not in acute distress.    Appearance: She is well-developed. She is  not diaphoretic.  HENT:     Head: Normocephalic and atraumatic.     Right Ear: External ear normal.     Left Ear: External ear normal.     Nose: Mucosal edema and rhinorrhea present.     Right Nostril: Epistaxis present.     Left Nostril: Epistaxis present.     Left Sinus: Maxillary sinus tenderness and frontal sinus tenderness present.     Mouth/Throat:     Pharynx: No oropharyngeal exudate.  Eyes:     General: No scleral icterus.       Right eye: No discharge.        Left eye: No discharge.     Conjunctiva/sclera: Conjunctivae normal.      Pupils: Pupils are equal, round, and reactive to light.  Neck:     Thyroid: No thyromegaly.     Vascular: No JVD.     Trachea: No tracheal deviation.  Cardiovascular:     Rate and Rhythm: Normal rate and regular rhythm.     Heart sounds: Normal heart sounds. No murmur heard.  No friction rub. No gallop.   Pulmonary:     Effort: Pulmonary effort is normal. No respiratory distress.     Breath sounds: Normal breath sounds. No stridor. No wheezing or rales.  Chest:     Chest wall: No tenderness.  Abdominal:     General: Bowel sounds are normal. There is no distension.     Palpations: Abdomen is soft. There is no mass.     Tenderness: There is no abdominal tenderness. There is no guarding or rebound.  Musculoskeletal:        General: No tenderness. Normal range of motion.     Cervical back: Normal range of motion and neck supple.  Lymphadenopathy:     Cervical: No cervical adenopathy.  Skin:    General: Skin is warm.     Coloration: Skin is not pale.     Findings: No erythema or rash.  Neurological:     Mental Status: She is alert and oriented to person, place, and time.     Cranial Nerves: No cranial nerve deficit.     Motor: No abnormal muscle tone.     Coordination: Coordination normal.     Deep Tendon Reflexes: Reflexes are normal and symmetric.  Psychiatric:        Behavior: Behavior normal.        Thought Content: Thought content normal.        Judgment: Judgment normal.           Assessment & Plan:  General medical exam  Osteopenia, unspecified location  Hyperlipidemia, unspecified hyperlipidemia type  Based on her blood pressure today the patient's 10-year risk of cardiovascular disease is 8.6% and would warrant a statin.  Based on her normal blood pressure however her 10-year risk would be 6.6%.  Patient has no family history of cardiovascular disease.  Therefore we together agreed that she would not benefit significantly from taking a statin and she elects  not to take 1.  I would repeat a bone density test next year.  Colonoscopy and Pap smear up-to-date.  Patient will schedule her mammogram with her gynecologist later this fall.  Epistaxis was treated with silver nitrate chemical cautery.  Recommended she use Afrin twice daily for 3 days afterward to complete the treatment.  Patient has been having some vaginal bleeding on Saturday.  I recommended that she contact her gynecologist.  Given her age, she may benefit  from an endometrial biopsy but certainly requires a pelvic exam.  She states that she will do that.  She denies any falls, depression, or memory loss.

## 2019-09-23 DIAGNOSIS — M79671 Pain in right foot: Secondary | ICD-10-CM | POA: Diagnosis not present

## 2019-09-23 DIAGNOSIS — M722 Plantar fascial fibromatosis: Secondary | ICD-10-CM | POA: Diagnosis not present

## 2019-09-26 DIAGNOSIS — N858 Other specified noninflammatory disorders of uterus: Secondary | ICD-10-CM | POA: Diagnosis not present

## 2019-09-26 DIAGNOSIS — N950 Postmenopausal bleeding: Secondary | ICD-10-CM | POA: Diagnosis not present

## 2019-09-26 DIAGNOSIS — M722 Plantar fascial fibromatosis: Secondary | ICD-10-CM | POA: Diagnosis not present

## 2019-10-12 ENCOUNTER — Encounter (HOSPITAL_COMMUNITY): Payer: Self-pay

## 2019-10-12 ENCOUNTER — Other Ambulatory Visit: Payer: Self-pay

## 2019-10-12 ENCOUNTER — Other Ambulatory Visit (HOSPITAL_COMMUNITY): Payer: Self-pay | Admitting: Gastroenterology

## 2019-10-12 ENCOUNTER — Other Ambulatory Visit: Payer: Self-pay | Admitting: Gastroenterology

## 2019-10-12 ENCOUNTER — Ambulatory Visit (HOSPITAL_COMMUNITY)
Admission: RE | Admit: 2019-10-12 | Discharge: 2019-10-12 | Disposition: A | Discharge status: Home / Self Care | Payer: Medicare Other | Source: Ambulatory Visit | Attending: Gastroenterology | Admitting: Gastroenterology

## 2019-10-12 DIAGNOSIS — R1032 Left lower quadrant pain: Secondary | ICD-10-CM

## 2019-10-12 DIAGNOSIS — N200 Calculus of kidney: Secondary | ICD-10-CM | POA: Diagnosis not present

## 2019-10-12 DIAGNOSIS — R194 Change in bowel habit: Secondary | ICD-10-CM | POA: Diagnosis not present

## 2019-10-12 DIAGNOSIS — I700 Atherosclerosis of aorta: Secondary | ICD-10-CM | POA: Diagnosis not present

## 2019-10-12 DIAGNOSIS — R1031 Right lower quadrant pain: Secondary | ICD-10-CM | POA: Diagnosis not present

## 2019-10-12 DIAGNOSIS — K5730 Diverticulosis of large intestine without perforation or abscess without bleeding: Secondary | ICD-10-CM | POA: Diagnosis not present

## 2019-10-12 DIAGNOSIS — N2889 Other specified disorders of kidney and ureter: Secondary | ICD-10-CM | POA: Diagnosis not present

## 2019-10-12 LAB — POCT I-STAT CREATININE: Creatinine, Ser: 0.7 mg/dL (ref 0.44–1.00)

## 2019-10-12 MED ORDER — IOHEXOL 300 MG/ML  SOLN
100.0000 mL | Freq: Once | INTRAMUSCULAR | Status: AC | PRN
  Administered 2019-10-12: 100 mL via INTRAVENOUS

## 2019-10-12 MED ORDER — IOHEXOL 9 MG/ML PO SOLN
ORAL | Status: AC
  Filled 2019-10-12: qty 1000

## 2019-10-19 DIAGNOSIS — H2513 Age-related nuclear cataract, bilateral: Secondary | ICD-10-CM | POA: Diagnosis not present

## 2019-10-19 DIAGNOSIS — H43813 Vitreous degeneration, bilateral: Secondary | ICD-10-CM | POA: Diagnosis not present

## 2019-10-19 DIAGNOSIS — H0102B Squamous blepharitis left eye, upper and lower eyelids: Secondary | ICD-10-CM | POA: Diagnosis not present

## 2019-10-19 DIAGNOSIS — H04123 Dry eye syndrome of bilateral lacrimal glands: Secondary | ICD-10-CM | POA: Diagnosis not present

## 2019-10-24 DIAGNOSIS — M79671 Pain in right foot: Secondary | ICD-10-CM | POA: Diagnosis not present

## 2019-10-24 DIAGNOSIS — M722 Plantar fascial fibromatosis: Secondary | ICD-10-CM | POA: Diagnosis not present

## 2019-10-26 DIAGNOSIS — M79671 Pain in right foot: Secondary | ICD-10-CM | POA: Diagnosis not present

## 2019-10-26 DIAGNOSIS — M722 Plantar fascial fibromatosis: Secondary | ICD-10-CM | POA: Diagnosis not present

## 2019-10-31 DIAGNOSIS — M722 Plantar fascial fibromatosis: Secondary | ICD-10-CM | POA: Diagnosis not present

## 2019-10-31 DIAGNOSIS — H0102B Squamous blepharitis left eye, upper and lower eyelids: Secondary | ICD-10-CM | POA: Diagnosis not present

## 2019-10-31 DIAGNOSIS — M79671 Pain in right foot: Secondary | ICD-10-CM | POA: Diagnosis not present

## 2019-10-31 DIAGNOSIS — H524 Presbyopia: Secondary | ICD-10-CM | POA: Diagnosis not present

## 2019-10-31 DIAGNOSIS — H43813 Vitreous degeneration, bilateral: Secondary | ICD-10-CM | POA: Diagnosis not present

## 2019-10-31 DIAGNOSIS — H2513 Age-related nuclear cataract, bilateral: Secondary | ICD-10-CM | POA: Diagnosis not present

## 2019-10-31 DIAGNOSIS — H04123 Dry eye syndrome of bilateral lacrimal glands: Secondary | ICD-10-CM | POA: Diagnosis not present

## 2019-11-02 ENCOUNTER — Ambulatory Visit: Payer: Medicare Other

## 2019-11-02 DIAGNOSIS — M722 Plantar fascial fibromatosis: Secondary | ICD-10-CM | POA: Diagnosis not present

## 2019-11-02 DIAGNOSIS — M79671 Pain in right foot: Secondary | ICD-10-CM | POA: Diagnosis not present

## 2019-11-03 ENCOUNTER — Ambulatory Visit (INDEPENDENT_AMBULATORY_CARE_PROVIDER_SITE_OTHER): Payer: Medicare Other | Admitting: *Deleted

## 2019-11-03 ENCOUNTER — Other Ambulatory Visit: Payer: Self-pay

## 2019-11-03 DIAGNOSIS — Z23 Encounter for immunization: Secondary | ICD-10-CM | POA: Diagnosis not present

## 2019-11-07 DIAGNOSIS — M79671 Pain in right foot: Secondary | ICD-10-CM | POA: Diagnosis not present

## 2019-11-07 DIAGNOSIS — M722 Plantar fascial fibromatosis: Secondary | ICD-10-CM | POA: Diagnosis not present

## 2019-11-09 DIAGNOSIS — M79671 Pain in right foot: Secondary | ICD-10-CM | POA: Diagnosis not present

## 2019-11-09 DIAGNOSIS — M722 Plantar fascial fibromatosis: Secondary | ICD-10-CM | POA: Diagnosis not present

## 2019-11-14 DIAGNOSIS — M79671 Pain in right foot: Secondary | ICD-10-CM | POA: Insufficient documentation

## 2019-11-14 DIAGNOSIS — M25571 Pain in right ankle and joints of right foot: Secondary | ICD-10-CM | POA: Diagnosis not present

## 2019-11-21 DIAGNOSIS — M25571 Pain in right ankle and joints of right foot: Secondary | ICD-10-CM | POA: Diagnosis not present

## 2019-12-12 DIAGNOSIS — M722 Plantar fascial fibromatosis: Secondary | ICD-10-CM | POA: Diagnosis not present

## 2019-12-14 DIAGNOSIS — Z853 Personal history of malignant neoplasm of breast: Secondary | ICD-10-CM | POA: Diagnosis not present

## 2019-12-14 DIAGNOSIS — Z1231 Encounter for screening mammogram for malignant neoplasm of breast: Secondary | ICD-10-CM | POA: Diagnosis not present

## 2019-12-14 DIAGNOSIS — Z01419 Encounter for gynecological examination (general) (routine) without abnormal findings: Secondary | ICD-10-CM | POA: Diagnosis not present

## 2020-01-16 ENCOUNTER — Other Ambulatory Visit: Payer: Self-pay | Admitting: Family Medicine

## 2020-01-16 ENCOUNTER — Encounter: Payer: Self-pay | Admitting: Family Medicine

## 2020-01-16 MED ORDER — AZITHROMYCIN 250 MG PO TABS: ORAL_TABLET | ORAL | 0 refills | Status: DC

## 2020-01-19 ENCOUNTER — Other Ambulatory Visit: Payer: Self-pay | Admitting: Family Medicine

## 2020-01-19 ENCOUNTER — Encounter: Payer: Self-pay | Admitting: Family Medicine

## 2020-01-19 MED ORDER — TAMSULOSIN HCL 0.4 MG PO CAPS: 0.4000 mg | ORAL_CAPSULE | Freq: Every day | ORAL | 0 refills | Status: DC

## 2020-01-19 MED ORDER — OXYCODONE-ACETAMINOPHEN 7.5-325 MG PO TABS: 1.0000 | ORAL_TABLET | ORAL | 0 refills | Status: DC | PRN

## 2020-04-02 ENCOUNTER — Encounter: Payer: Self-pay | Admitting: Family Medicine

## 2020-04-02 DIAGNOSIS — L820 Inflamed seborrheic keratosis: Secondary | ICD-10-CM | POA: Diagnosis not present

## 2020-04-02 DIAGNOSIS — D2272 Melanocytic nevi of left lower limb, including hip: Secondary | ICD-10-CM | POA: Diagnosis not present

## 2020-04-02 DIAGNOSIS — D225 Melanocytic nevi of trunk: Secondary | ICD-10-CM | POA: Diagnosis not present

## 2020-04-02 DIAGNOSIS — L718 Other rosacea: Secondary | ICD-10-CM | POA: Diagnosis not present

## 2020-04-02 DIAGNOSIS — D1801 Hemangioma of skin and subcutaneous tissue: Secondary | ICD-10-CM | POA: Diagnosis not present

## 2020-04-02 DIAGNOSIS — L821 Other seborrheic keratosis: Secondary | ICD-10-CM | POA: Diagnosis not present

## 2020-04-02 DIAGNOSIS — L819 Disorder of pigmentation, unspecified: Secondary | ICD-10-CM | POA: Diagnosis not present

## 2020-04-02 DIAGNOSIS — D485 Neoplasm of uncertain behavior of skin: Secondary | ICD-10-CM | POA: Diagnosis not present

## 2020-04-02 DIAGNOSIS — L814 Other melanin hyperpigmentation: Secondary | ICD-10-CM | POA: Diagnosis not present

## 2020-04-02 DIAGNOSIS — D2262 Melanocytic nevi of left upper limb, including shoulder: Secondary | ICD-10-CM | POA: Diagnosis not present

## 2020-06-12 ENCOUNTER — Ambulatory Visit: Payer: Medicare Other | Admitting: Family Medicine

## 2020-06-15 ENCOUNTER — Ambulatory Visit: Payer: Medicare Other | Admitting: Family Medicine

## 2020-06-15 ENCOUNTER — Ambulatory Visit (INDEPENDENT_AMBULATORY_CARE_PROVIDER_SITE_OTHER): Payer: Medicare Other | Admitting: Family Medicine

## 2020-06-15 ENCOUNTER — Encounter: Payer: Self-pay | Admitting: Family Medicine

## 2020-06-15 ENCOUNTER — Other Ambulatory Visit: Payer: Self-pay

## 2020-06-15 VITALS — BP 118/64 | HR 78 | Temp 98.0°F | Resp 16 | Ht 64.5 in | Wt 137.0 lb

## 2020-06-15 DIAGNOSIS — H6981 Other specified disorders of Eustachian tube, right ear: Secondary | ICD-10-CM

## 2020-06-15 MED ORDER — OMEPRAZOLE 40 MG PO CPDR: 40.0000 mg | DELAYED_RELEASE_CAPSULE | Freq: Every day | ORAL | 3 refills | Status: DC

## 2020-06-15 NOTE — Progress Notes (Signed)
Subjective:    Patient ID: Brittany House, female    DOB: 1950-03-28, 70 y.o.   MRN: [id number]  HPI  Patient reports a popping sound in her right ear.  She describes it as a tapping almost like her ear popping when you go up in the mountains.  It is a slow constant intermittent tapping.  She also has some muted hearing.  She does not describe tinnitus.  She denies any ringing or vibrating or buzzing sound is more of a, popping pressure-like sensation.  She has been battling allergies and has significant head congestion.  She has also having heartburn  Past Medical History:  Diagnosis Date  . Cancer (HCC)    breast  . GERD (gastroesophageal reflux disease)   . Hyperlipidemia   . Osteopenia   . Osteoporosis    Past Surgical History:  Procedure Laterality Date  . BREAST SURGERY N/A    [facility]reesia 09/18/2019  . c-sections x 3    . HERNIA REPAIR    . MASTECTOMY     Current Outpatient Medications on File Prior to Visit  Medication Sig Dispense Refill  . azithromycin (ZITHROMAX) 250 MG tablet 2 tabs poqday1, 1 tab poqday 2-5 6 tablet 0  . calcium carbonate (OS-CAL) 600 MG TABS tablet Take 600 mg by mouth 2 (two) times daily with a meal.    . fluticasone (FLONASE) 50 MCG/ACT nasal spray SHAKE LIQUID AND USE 2 SPRAYS IN EACH NOSTRIL DAILY 16 g 6  . loratadine (CLARITIN) 10 MG tablet Take 1 tablet (10 mg total) by mouth daily. 30 tablet 11  . metroNIDAZOLE (METROCREAM) 0.75 % cream Apply 1 application topically 2 (two) times daily.    . Multiple Vitamin (MULTIVITAMIN) tablet Take 1 tablet by mouth daily.    Marland Kitchen oxyCODONE-acetaminophen (PERCOCET) 7.5-325 MG tablet Take 1 tablet by mouth every 4 (four) hours as needed for severe pain. 30 tablet 0  . tamsulosin (FLOMAX) 0.4 MG CAPS capsule Take 1 capsule (0.4 mg total) by mouth daily. 30 capsule 0   No current facility-administered medications on file prior to visit.   Allergies  Allergen Reactions  . Penicillins Swelling and Rash    Has  patient had a PCN reaction causing immediate rash, facial/tongue/throat swelling, SOB or lightheadedness with hypotension: Yes Has patient had a PCN reaction causing severe rash involving mucus membranes or skin necrosis: Yes Has patient had a PCN reaction that required hospitalization: No Has patient had a PCN reaction occurring within the last 10 years: No If all of the above answers are "NO", then may proceed with Cephalosporin use.    Social History   Socioeconomic History  . Marital status: Married    Spouse name: Not on file  . Number of children: Not on file  . Years of education: Not on file  . Highest education level: Not on file  Occupational History  . Not on file  Tobacco Use  . Smoking status: Never Smoker  . Smokeless tobacco: Never Used  Substance and Sexual Activity  . Alcohol use: Yes    Comment: Rare  . Drug use: No  . 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  Other Topics Concern  . Not on file  Social History Narrative  . Not on file   Social Determinants of Health   Financial Resource Strain: Not on file  Food Insecurity: Not on file  Transportation Needs: Not on file  Physical Activity: Not on file  Stress: Not on file  Social Connections: Not on file  Intimate Partner Violence: Not on file     Review of Systems  All other systems reviewed and are negative.      Objective:   Physical Exam Vitals reviewed.  Constitutional:      Appearance: She is well-developed.  HENT:     Head: Normocephalic and atraumatic.     Right Ear: Tympanic membrane, ear canal and external ear normal.     Left Ear: Tympanic membrane, ear canal and external ear normal.     Mouth/Throat:     Pharynx: No oropharyngeal exudate.  Cardiovascular:     Rate and Rhythm: Normal rate and regular rhythm.     Heart sounds: Normal heart sounds. No murmur heard. No friction rub. No gallop.   Pulmonary:     Effort: Pulmonary effort is normal. No  respiratory distress.     Breath sounds: No wheezing, rhonchi or rales.  Chest:     Chest wall: No tenderness.  Abdominal:     General: Bowel sounds are normal. There is no distension.     Palpations: Abdomen is soft.     Tenderness: There is no abdominal tenderness. There is no guarding or rebound.  Musculoskeletal:     Cervical back: Neck supple.           Assessment & Plan:  Eustachian tube dysfunction, right  Exam is unremarkable today.  She denies any severe neurologic deficits or severe one-sided headaches or vertigo or hearing loss.  Therefore I think this is eustachian tube dysfunction and pressure equalization issues due to allergies.  I recommended she add an antihistamine to her Flonase.  We will also start omeprazole 40 mg a day for persistent GERD

## 2020-07-03 DIAGNOSIS — L719 Rosacea, unspecified: Secondary | ICD-10-CM | POA: Diagnosis not present

## 2020-07-03 DIAGNOSIS — H01005 Unspecified blepharitis left lower eyelid: Secondary | ICD-10-CM | POA: Diagnosis not present

## 2020-07-20 ENCOUNTER — Emergency Department (HOSPITAL_COMMUNITY): Payer: Medicare Other

## 2020-07-20 ENCOUNTER — Emergency Department (HOSPITAL_COMMUNITY)
Admission: EM | Admit: 2020-07-20 | Discharge: 2020-07-20 | Disposition: A | Discharge status: Home / Self Care | Payer: Medicare Other | Attending: Emergency Medicine | Admitting: Emergency Medicine

## 2020-07-20 DIAGNOSIS — Z23 Encounter for immunization: Secondary | ICD-10-CM | POA: Insufficient documentation

## 2020-07-20 DIAGNOSIS — S060X0A Concussion without loss of consciousness, initial encounter: Secondary | ICD-10-CM | POA: Diagnosis not present

## 2020-07-20 DIAGNOSIS — W2103XA Struck by baseball, initial encounter: Secondary | ICD-10-CM | POA: Insufficient documentation

## 2020-07-20 DIAGNOSIS — S01112A Laceration without foreign body of left eyelid and periocular area, initial encounter: Secondary | ICD-10-CM | POA: Diagnosis not present

## 2020-07-20 DIAGNOSIS — Z853 Personal history of malignant neoplasm of breast: Secondary | ICD-10-CM | POA: Diagnosis not present

## 2020-07-20 DIAGNOSIS — Y92320 Baseball field as the place of occurrence of the external cause: Secondary | ICD-10-CM | POA: Insufficient documentation

## 2020-07-20 DIAGNOSIS — H538 Other visual disturbances: Secondary | ICD-10-CM | POA: Diagnosis not present

## 2020-07-20 DIAGNOSIS — S0590XA Unspecified injury of unspecified eye and orbit, initial encounter: Secondary | ICD-10-CM | POA: Diagnosis not present

## 2020-07-20 DIAGNOSIS — S0990XA Unspecified injury of head, initial encounter: Secondary | ICD-10-CM | POA: Diagnosis not present

## 2020-07-20 DIAGNOSIS — S0101XA Laceration without foreign body of scalp, initial encounter: Secondary | ICD-10-CM | POA: Diagnosis not present

## 2020-07-20 MED ORDER — MORPHINE SULFATE 15 MG PO TABS: 15.0000 mg | ORAL_TABLET | Freq: Four times a day (QID) | ORAL | 0 refills | Status: DC | PRN

## 2020-07-20 MED ORDER — ACETAMINOPHEN 325 MG PO TABS
650.0000 mg | ORAL_TABLET | Freq: Once | ORAL | Status: DC
  Filled 2020-07-20: qty 2

## 2020-07-20 MED ORDER — ERYTHROMYCIN 5 MG/GM OP OINT: TOPICAL_OINTMENT | OPHTHALMIC | 0 refills | Status: DC

## 2020-07-20 MED ORDER — TETANUS-DIPHTH-ACELL PERTUSSIS 5-2.5-18.5 LF-MCG/0.5 IM SUSY
0.5000 mL | PREFILLED_SYRINGE | Freq: Once | INTRAMUSCULAR | Status: AC
  Administered 2020-07-20: 0.5 mL via INTRAMUSCULAR
  Filled 2020-07-20: qty 0.5

## 2020-07-20 MED ORDER — LIDOCAINE-EPINEPHRINE (PF) 2 %-1:200000 IJ SOLN
10.0000 mL | Freq: Once | INTRAMUSCULAR | Status: AC
  Administered 2020-07-20: 10 mL
  Filled 2020-07-20: qty 20

## 2020-07-20 NOTE — ED Triage Notes (Signed)
[place] in [place] by baseball. No LOC. Pt with lac to left eye. Bleeding controlled. C/o blurred vision. Pupil reactive. Pt alert, oriented x4. VSS.

## 2020-07-20 NOTE — ED Provider Notes (Signed)
Emergency Medicine Provider Triage Evaluation Note  Brittany House , a 70 y.o. female  was evaluated in triage.  Pt complains of head injury.  She does not take any blood thinning medications.  She was [place] a baseball stadium hidden in the area above her left eye with a baseball.  No loss of consciousness.  Has not had a tetanus shot in the past 5 years.  No pain in her neck.  She states that originally her vision in the eye was okay however it is now becoming blurred..  Review of Systems  Positive: Head injury Negative: LOC  Physical Exam  BP (!) 159/87 (BP Location: Right Arm)   Pulse 85   Temp 98.1 F (36.7 C)   Resp 16   SpO2 99%  Gen:   Awake, no distress   Resp:  Normal effort  MSK:   Moves extremities without difficulty  Other:  Speech is not slurred.  There is a laceration through the left eyebrow extending into the lid.  Laceration is not clearly through the lid.  Left pupil is equal round reactive to light.  Able to track.  Medical Decision Making  Medically screening exam initiated at 8:25 PM.  Appropriate orders placed.  Adelis Docter was informed that the remainder of the evaluation will be completed by another provider, this initial triage assessment does not replace that evaluation, and the importance of remaining in the ED until their evaluation is complete.     Ollen Gross 07/20/20 2027    Truddie Hidden, MD 07/20/20 2232

## 2020-07-20 NOTE — Discharge Instructions (Addendum)
I have sent two prescriptions to your pharmacy that you will need to pick up.  The first is an antibiotic ointment for your left eye. The second is a pain medication you can take for severe pain.  I also recommend tylenol (500mg ) every 6 hours for the next 3 days for pain control. Apply ice to the injury every 4-6 hours for pain and swelling control.

## 2020-07-20 NOTE — Consult Note (Signed)
Chief Complaint  Patient presents with   Head Injury  :       Ophthalmology HPI: This is a 70 y.o.  female with a past ocular history of ocular rosacea that presents with left brow pain, swelling and discomfort after being hit with a baseball at a [place] game 4 hours ago.  She states she has some blurry vision. She thinks her left eye is droopy. She is sore and tender.   Denies diplopia, flashes of light, floaters curtains coming over vision. or loss of vision.      Past Ocular History: Ocular rosacea    Last Eye Exam: <1 year    Primary Eye Care: [facility]   Past Medical History:  Diagnosis Date   Cancer [facility])    breast   GERD (gastroesophageal reflux disease)    Hyperlipidemia    Osteopenia    Osteoporosis      Past Surgical History:  Procedure Laterality Date   BREAST SURGERY N/A    Phreesia 09/18/2019   c-sections x 3     HERNIA REPAIR     MASTECTOMY       Social History   Socioeconomic History   Marital status: Married    Spouse name: Not on file   Number of children: Not on file   Years of education: Not on file   Highest education level: Not on file  Occupational History   Not on file  Tobacco Use   Smoking status: Never   Smokeless tobacco: Never  Substance and Sexual Activity   Alcohol use: Yes    Comment: Rare   Drug use: No   Sexual activity: Yes    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  Other Topics Concern   Not on file  Social History Narrative   Not on file   Social Determinants of Health   Financial Resource Strain: Not on file  Food Insecurity: Not on file  Transportation Needs: Not on file  Physical Activity: Not on file  Stress: Not on file  Social Connections: Not on file  Intimate Partner Violence: Not on file     Allergies  Allergen Reactions   Penicillins Swelling and Rash    Has patient had a PCN reaction causing immediate rash, facial/tongue/throat swelling, SOB or  lightheadedness with hypotension: Yes Has patient had a PCN reaction causing severe rash involving mucus membranes or skin necrosis: Yes Has patient had a PCN reaction that required hospitalization: No Has patient had a PCN reaction occurring within the last 10 years: No If all of the above answers are "NO", then may proceed with Cephalosporin use.      No current facility-administered medications on file prior to encounter.   Current Outpatient Medications on File Prior to Encounter  Medication Sig Dispense Refill   calcium carbonate (OS-CAL) 600 MG TABS tablet Take 600 mg by mouth 2 (two) times daily with a meal.     doxycycline (VIBRA-TABS) 100 MG tablet Only take as needed for ocular rosacea     fluticasone (FLONASE) 50 MCG/ACT nasal spray SHAKE LIQUID AND USE 2 SPRAYS IN EACH NOSTRIL DAILY 16 g 6   metroNIDAZOLE (METROCREAM) 0.75 % cream Apply 1 application topically 2 (two) times daily.     Multiple Vitamin (MULTIVITAMIN) tablet Take 1 tablet by mouth daily.     omeprazole (PRILOSEC) 40 MG capsule Take 1 capsule (40 mg total) by mouth daily. 30 capsule 3     ROS  Exam:  General: Awake, Alert, Oriented *3  Vision (near): without correction   OD: J8  OS: Je  Confrontational Field:   Full to count fingers, both eyes  Extraocular Motility:  Full ductions and versions, both eyes  [place]:   Trace commitant exodeviation without vertical.   External:   Left brow laceration approximately 2.5cm superior to brow extending inferiorly 3 cm below the brow in an L fashion below the eye brow.  Laceration stops 1 cm  superior from lash line  Ecchymosis left brow and left upper eyelid.     Pupils  OD: 59mm to 57mm reactive without afferent pupillary defect (APD)  OS: 54mm to 66mm reactive without afferent pupillary defect (APD)   IOP(tonopen)  OD: 15  OS: 18  PF: 8/7 LF: 15/14  Appears to have mild drooping from lid swelling. Upper lid motility appears appropriate with very  low suspicion of ptosis.   Slit Lamp Exam:  Lids/Lashes  OD: Normal Lids and lashes, No lesion or injury  OS: Lid edema,    Conjucntiva/Sclera  OD: White and quiet  OS: Trace injection  Cornea  OD: Clear without abrasion or defect  OS: Clear without abrasion or defect  Anterior Chamber  OD: Deep and quiet  OS: Deep and quiet- No obvious cell. No hyphema   Iris  OD: Normal iris architecture  OS: Normal Iris Architecture   Lens  OD: Clear, Without significant opacities  OS: Clear, Without significant opacities  Anterior Vitreous  OD: Clear, without cell  OS: Clear without cell   POSTERIOR POLE EXAM (Dialated with phenylephrine and tropicamide.Dilation may last up to 24 hours)  View:   OD: 20/20 view without opacities  OS: 20/20 view without opacities  Vitreous:   OD: Clear, no cell  OS: Clear, no cell  Disc:   OD: flat, sharp margin, with appropriate color  OS: flat, sharp margin, with appropriate color  C:D Ratio:   OD: 0.4  OS: 0.4  Macula  OD: Flat, with appropriate light reflex  OS: Flat with appropriate light reflex  Vessels  OD: Normal vasculature  OS: Normal vasculature  Periphery  OD: Flat 360 degrees without tear, hole or detachment  OS: Flat 360 degrees without tear, hole or detachment.  No tear on SD exam OS.      Assessment and Plan:   This is 70 y.o.  female with left brow laceration after being hit by a baseball. No eye involvement. No levator involvement.    Discussed diagnosis, prognosis and treatment options. Recommend repair of brow laceration. Discussed risk of scarring, infection, bleeding, need for additional surgery.    Procedure:  Patient left brow was prepped with betadine. Lidocaine with epinephrine was injected subcutaneously. Good anesthesia was achieved.  A 4-0 Chromic was used to approximate the brow in an interrupted fashion. The laceration was then closed with 6-0 chromic in an running -locking fashion.  Good  approximation of the wound was achieved. Bacitracin ointment was placed on the incision. The procedure was terminated.     Brow Lac - Repaired at bedside - Recommend Erythromycin ophthalmic ointment BID x7 days  - Vicodin Q8 prn pain - Doxycycline 50mg  po BID x5 days - Follow up as outpatient in one week.  - OK to shower tomorrow. No beaches and no pools for 1 week.  - Clean incision with soap and water as needed. Pat dry.  - Call for worsening redness, swelling, pain, or decreased vision.    Julian Reil,  M.D.  [facility] [address] [place], [place] [address] [phone] ([phone]

## 2020-07-20 NOTE — ED Provider Notes (Signed)
[facility] EMERGENCY DEPARTMENT Provider Note   CSN: [id number] Arrival date & time: 07/20/20  2018     History Chief Complaint  Patient presents with   Head Injury    Brittany House is a 70 y.o. female.   Head Injury Location:  Frontal Time since incident:  2 hours Mechanism of injury: direct blow   Pain details:    Quality:  Aching and dull   Radiates to:  Face   Severity:  Moderate   Duration:  2 hours   Timing:  Constant   Progression:  Unchanged Chronicity:  New Relieved by:  Nothing Exacerbated by: Extraocular eye movements. Associated symptoms: blurred vision (Left eye), headache and nausea   Associated symptoms: no difficulty breathing, no disorientation, no double vision, no focal weakness, no hearing loss, no loss of consciousness, no memory loss, no neck pain, no numbness, no seizures, no tinnitus and no vomiting   Eye Problem Location:  Left eye Quality:  Aching and dull Severity:  Moderate Onset quality:  Sudden Duration:  2 hours Timing:  Constant Progression:  Worsening Chronicity:  New Context: direct trauma   Context: not burn, not chemical exposure, not contact lens problem, not foreign body, not using machinery, not scratch, not smoke exposure and not UV exposure   Relieved by:  Nothing Worsened by:  Bright light and eye movement Ineffective treatments:  None tried Associated symptoms: blurred vision (Left eye), decreased vision, headaches, nausea and swelling   Associated symptoms: no crusting, no discharge, no double vision, no facial rash, no inflammation, no itching, no numbness, no photophobia, no redness, no scotomas, no tearing, no tingling, no vomiting and no weakness   Risk factors comment:  Ocular rosacea  [place] a baseball stadium and was hit in the left side of the forehead above her left eye with a foul ball. No LOC. Reported laceration to the forehead involving the left upper eyelid. Having blurred vision left eye.       Past Medical History:  Diagnosis Date   Cancer [facility])    breast   GERD (gastroesophageal reflux disease)    Hyperlipidemia    Osteopenia    Osteoporosis     Patient Active Problem List   Diagnosis Date Noted   Osteoporosis 08/27/2018   Menopausal syndrome 08/27/2018   Hyperlipidemia    Osteopenia     Past Surgical History:  Procedure Laterality Date   BREAST SURGERY N/A    Phreesia 09/18/2019   c-sections x 3     HERNIA REPAIR     MASTECTOMY       OB History   No obstetric history on file.     Family History  Problem Relation Age of Onset   Heart disease Neg Hx    Cancer Neg Hx     Social History   Tobacco Use   Smoking status: Never   Smokeless tobacco: Never  Substance Use Topics   Alcohol use: Yes    Comment: Rare   Drug use: No    Home Medications Prior to Admission medications   Medication Sig Start Date End Date Taking? Authorizing Provider  erythromycin ophthalmic ointment Place a 1/2 inch ribbon of ointment over the wound twice per day for 5 days 07/20/20  Yes Sherwood Gambler, MD  morphine (MSIR) 15 MG tablet Take 1 tablet (15 mg total) by mouth every 6 (six) hours as needed for severe pain. 07/20/20  Yes Sherwood Gambler, MD  calcium carbonate (OS-CAL) 600 MG  TABS tablet Take 600 mg by mouth 2 (two) times daily with a meal.    [provider]  doxycycline (VIBRA-TABS) 100 MG tablet Only take as needed for ocular rosacea 10/31/19   [provider]  fluticasone (FLONASE) 50 MCG/ACT nasal spray SHAKE LIQUID AND USE 2 SPRAYS IN EACH NOSTRIL DAILY 02/16/18   Susy Frizzle, MD  metroNIDAZOLE (METROCREAM) 0.75 % cream Apply 1 application topically 2 (two) times daily.    [provider]  Multiple Vitamin (MULTIVITAMIN) tablet Take 1 tablet by mouth daily.    [provider]  omeprazole (PRILOSEC) 40 MG capsule Take 1 capsule (40 mg total) by mouth daily. 06/15/20   Susy Frizzle, MD    Allergies     Penicillins  Review of Systems   Review of Systems  HENT:  Negative for hearing loss and tinnitus.   Eyes:  Positive for blurred vision (Left eye). Negative for double vision, photophobia, discharge, redness and itching.  Gastrointestinal:  Positive for nausea. Negative for vomiting.  Musculoskeletal:  Negative for neck pain.  Neurological:  Positive for headaches. Negative for tingling, focal weakness, seizures, loss of consciousness, weakness and numbness.  Psychiatric/Behavioral:  Negative for memory loss.    Physical Exam Updated Vital Signs BP 136/80   Pulse 89   Temp 97.8 F (36.6 C) (Tympanic)   Resp (!) 21   SpO2 99%   Physical Exam Vitals and nursing note reviewed.  Constitutional:      General: She is not in acute distress.    Appearance: She is well-developed.  HENT:     Head: Normocephalic.     Jaw: There is normal jaw occlusion.     Comments:  Midface stable    Right Ear: Tympanic membrane normal.     Left Ear: Tympanic membrane normal.     Nose:     Right Nostril: No epistaxis or septal hematoma.     Left Nostril: No epistaxis or septal hematoma.     Mouth/Throat:     Mouth: No injury.  Eyes:     General: No visual field deficit.    Extraocular Movements: Extraocular movements intact.     Conjunctiva/sclera: Conjunctivae normal.     Left eye: Chemosis present.     Pupils:     Right eye: Pupil is round and reactive.     Left eye: Pupil is round and reactive.   Cardiovascular:     Rate and Rhythm: Normal rate and regular rhythm.     Heart sounds: No murmur heard. Pulmonary:     Effort: Pulmonary effort is normal. No respiratory distress.     Breath sounds: Normal breath sounds.  Abdominal:     Palpations: Abdomen is soft.     Tenderness: There is no abdominal tenderness.  Musculoskeletal:     Cervical back: Full passive range of motion without pain, normal range of motion and neck supple.  Skin:    General: Skin is warm and dry.  Neurological:      Mental Status: She is alert.    ED Results / Procedures / Treatments   Labs (all labs ordered are listed, but only abnormal results are displayed) Labs Reviewed - No data to display  EKG None  Radiology CT Head Wo Contrast  Result Date: 07/20/2020 CLINICAL DATA:  Struck in the left eye by a baseball EXAM: CT HEAD WITHOUT CONTRAST TECHNIQUE: Contiguous axial images were obtained from the base of the skull through the vertex without intravenous  contrast. COMPARISON:  01/12/2006 FINDINGS: Brain: No acute infarct or hemorrhage. The lateral ventricles and midline structures are unremarkable. No acute extra-axial fluid collections. No mass effect. Vascular: No hyperdense vessel or unexpected calcification. Skull: Normal. Negative for fracture or focal lesion. Sinuses/Orbits: No acute finding. Other: None. IMPRESSION: 1. No acute intracranial process. Electronically Signed   By: Randa Ngo M.D.   On: 07/20/2020 20:55   CT Maxillofacial Wo Contrast  Result Date: 07/20/2020 CLINICAL DATA:  Hit in left side of head with baseball. EXAM: CT MAXILLOFACIAL WITHOUT CONTRAST TECHNIQUE: Multidetector CT imaging of the maxillofacial structures was performed. Multiplanar CT image reconstructions were also generated. COMPARISON:  CT head without contrast 07/20/2020 FINDINGS: Osseous: No acute fracture.  Is present. Orbits: Orbital rim is intact. Left supraorbital hematoma is present. This extends into the upper lid. Globe is intact. Sinuses: The paranasal sinuses and mastoid air cells are clear. Soft tissues: Left supraorbital scalp hematoma and laceration. No foreign body. Limited intracranial: Within normal limits IMPRESSION: Left supraorbital scalp hematoma and laceration without underlying fracture. Electronically Signed   By: San Morelle M.D.   On: 07/20/2020 21:00    Procedures Procedures   Medications Ordered in ED Medications  acetaminophen (TYLENOL) tablet 650 mg (650 mg Oral Not  Given 07/20/20 2053)  Tdap (BOOSTRIX) injection 0.5 mL (0.5 mLs Intramuscular Given 07/20/20 2233)  lidocaine-EPINEPHrine (XYLOCAINE W/EPI) 2 %-1:200000 (PF) injection 10 mL (10 mLs Infiltration Given 07/20/20 2234)    ED Course  I have reviewed the triage vital signs and the nursing notes.  Pertinent labs & imaging results that were available during my care of the patient were reviewed by me and considered in my medical decision making (see chart for details).  Clinical Course as of 07/21/20 0002  Fri Jul 20, 2020  2137 I spoke with ophthalmology and discussed the patient's exam findings.  They will come evaluate the patient. [ZB]    Clinical Course User Index [ZB] Pearson Grippe, DO   MDM Rules/Calculators/A&P                          Impression is complex laceration involving the left upper eyelid. I did consider open globe however exam is reassuring. CT imaging as above without evidence of ICH or other traumatic injuries to the face. Tetanus was updated. Ophthamology was consulted for evaluation of the left eye given extent of laceration and vision symptoms. See their note for full details.  They repaired laceration at bedside. They will schedule outpatient follow up. RX erythromycin ointment and pain medication.  D/c in good condition.  Final Clinical Impression(s) / ED Diagnoses Final diagnoses:  Injury of head, initial encounter  Eyebrow laceration, left, initial encounter  Concussion without loss of consciousness, initial encounter    Rx / DC Orders ED Discharge Orders          Ordered    erythromycin ophthalmic ointment        07/20/20 2232    morphine (MSIR) 15 MG tablet  Every 6 hours PRN        07/20/20 2232             Pearson Grippe, DO 07/21/20 0002    Sherwood Gambler, MD 07/23/20 [phone]

## 2020-07-27 DIAGNOSIS — S060X0A Concussion without loss of consciousness, initial encounter: Secondary | ICD-10-CM | POA: Diagnosis not present

## 2020-07-27 DIAGNOSIS — H209 Unspecified iridocyclitis: Secondary | ICD-10-CM | POA: Diagnosis not present

## 2020-07-27 DIAGNOSIS — S01112A Laceration without foreign body of left eyelid and periocular area, initial encounter: Secondary | ICD-10-CM | POA: Diagnosis not present

## 2020-08-06 DIAGNOSIS — Z20822 Contact with and (suspected) exposure to covid-19: Secondary | ICD-10-CM | POA: Diagnosis not present

## 2020-08-07 ENCOUNTER — Encounter: Payer: Self-pay | Admitting: Family Medicine

## 2020-08-09 DIAGNOSIS — H209 Unspecified iridocyclitis: Secondary | ICD-10-CM | POA: Diagnosis not present

## 2020-08-15 ENCOUNTER — Other Ambulatory Visit: Payer: Self-pay

## 2020-08-15 ENCOUNTER — Other Ambulatory Visit: Payer: Medicare Other

## 2020-08-15 DIAGNOSIS — E785 Hyperlipidemia, unspecified: Secondary | ICD-10-CM

## 2020-08-15 DIAGNOSIS — Z1159 Encounter for screening for other viral diseases: Secondary | ICD-10-CM | POA: Diagnosis not present

## 2020-08-15 DIAGNOSIS — M8580 Other specified disorders of bone density and structure, unspecified site: Secondary | ICD-10-CM

## 2020-08-15 DIAGNOSIS — Z13220 Encounter for screening for lipoid disorders: Secondary | ICD-10-CM

## 2020-08-15 DIAGNOSIS — Z136 Encounter for screening for cardiovascular disorders: Secondary | ICD-10-CM | POA: Diagnosis not present

## 2020-08-15 DIAGNOSIS — Z0000 Encounter for general adult medical examination without abnormal findings: Secondary | ICD-10-CM | POA: Diagnosis not present

## 2020-08-15 DIAGNOSIS — E559 Vitamin D deficiency, unspecified: Secondary | ICD-10-CM

## 2020-08-16 LAB — CBC WITH DIFFERENTIAL/PLATELET
Absolute Monocytes: 419 cells/uL (ref 200–950)
Basophils Absolute: 32 cells/uL (ref 0–200)
Basophils Relative: 0.7 %
Eosinophils Absolute: 78 cells/uL (ref 15–500)
Eosinophils Relative: 1.7 %
HCT: 44.3 % (ref 35.0–45.0)
Hemoglobin: 14.8 g/dL (ref 11.7–15.5)
Lymphs Abs: 1569 cells/uL (ref 850–3900)
MCH: 31.7 pg (ref 27.0–33.0)
MCHC: 33.4 g/dL (ref 32.0–36.0)
MCV: 94.9 fL (ref 80.0–100.0)
MPV: 11 fL (ref 7.5–12.5)
Monocytes Relative: 9.1 %
Neutro Abs: 2502 cells/uL (ref 1500–7800)
Neutrophils Relative %: 54.4 %
Platelets: 270 10*3/uL (ref 140–400)
RBC: 4.67 10*6/uL (ref 3.80–5.10)
RDW: 12.1 % (ref 11.0–15.0)
Total Lymphocyte: 34.1 %
WBC: 4.6 10*3/uL (ref 3.8–10.8)

## 2020-08-16 LAB — LIPID PANEL
Cholesterol: 198 mg/dL (ref <200)
HDL: 49 mg/dL — ABNORMAL LOW (ref >=50)
LDL Cholesterol (Calc): 121 mg/dL (calc) — ABNORMAL HIGH
Non-HDL Cholesterol (Calc): 149 mg/dL (calc) — ABNORMAL HIGH (ref <130)
Total CHOL/HDL Ratio: 4 (calc) (ref <5.0)
Triglycerides: 167 mg/dL — ABNORMAL HIGH (ref <150)

## 2020-08-16 LAB — VITAMIN D 25 HYDROXY (VIT D DEFICIENCY, FRACTURES): Vit D, 25-Hydroxy: 38 ng/mL (ref 30–100)

## 2020-08-16 LAB — COMPLETE METABOLIC PANEL WITH GFR
AG Ratio: 1.8 (calc) (ref 1.0–2.5)
ALT: 17 U/L (ref 6–29)
AST: 16 U/L (ref 10–35)
Albumin: 4.1 g/dL (ref 3.6–5.1)
Alkaline phosphatase (APISO): 60 U/L (ref 37–153)
BUN: 15 mg/dL (ref 7–25)
CO2: 24 mmol/L (ref 20–32)
Calcium: 9.3 mg/dL (ref 8.6–10.4)
Chloride: 105 mmol/L (ref 98–110)
Creat: 0.83 mg/dL (ref 0.50–1.05)
Globulin: 2.3 g/dL (calc) (ref 1.9–3.7)
Glucose, Bld: 92 mg/dL (ref 65–99)
Potassium: 5.6 mmol/L — ABNORMAL HIGH (ref 3.5–5.3)
Sodium: 140 mmol/L (ref 135–146)
Total Bilirubin: 0.5 mg/dL (ref 0.2–1.2)
Total Protein: 6.4 g/dL (ref 6.1–8.1)
eGFR: 77 mL/min/{1.73_m2} (ref >=60)

## 2020-08-16 LAB — HEPATITIS C ANTIBODY
Hepatitis C Ab: NONREACTIVE
SIGNAL TO CUT-OFF: 0.03 (ref <1.00)

## 2020-08-17 DIAGNOSIS — Z20822 Contact with and (suspected) exposure to covid-19: Secondary | ICD-10-CM | POA: Diagnosis not present

## 2020-08-24 ENCOUNTER — Encounter: Payer: Self-pay | Admitting: Family Medicine

## 2020-08-24 ENCOUNTER — Other Ambulatory Visit: Payer: Self-pay

## 2020-08-24 ENCOUNTER — Ambulatory Visit (INDEPENDENT_AMBULATORY_CARE_PROVIDER_SITE_OTHER): Payer: Medicare Other | Admitting: Family Medicine

## 2020-08-24 VITALS — BP 112/62 | HR 88 | Temp 98.1°F | Resp 14 | Ht 64.5 in | Wt 136.0 lb

## 2020-08-24 DIAGNOSIS — S0993XA Unspecified injury of face, initial encounter: Secondary | ICD-10-CM

## 2020-08-24 DIAGNOSIS — S0993XS Unspecified injury of face, sequela: Secondary | ICD-10-CM

## 2020-08-24 DIAGNOSIS — Z20822 Contact with and (suspected) exposure to covid-19: Secondary | ICD-10-CM | POA: Diagnosis not present

## 2020-08-24 MED ORDER — LIDOCAINE-PRILOCAINE 2.5-2.5 % EX CREA: 1.0000 "application " | TOPICAL_CREAM | CUTANEOUS | 0 refills | Status: DC | PRN

## 2020-08-24 MED ORDER — CELECOXIB 200 MG PO CAPS: 200.0000 mg | ORAL_CAPSULE | Freq: Two times a day (BID) | ORAL | 0 refills | Status: DC

## 2020-08-24 NOTE — Progress Notes (Signed)
Subjective:    Patient ID: Brittany House, female    DOB: [birth year], 70 y.o.   MRN: [id number]  HPI  Approximately 5 weeks ago, the patient was at a baseball game.  A foul ball was hit into the stands.  Patient was struck just above her left eye and suffered a laceration to her left eyebrow.  She was taken the emergency room.  A CT scan showed no intracranial hemorrhage and no skull fracture.  Ophthalmology closed the laceration that extended from just above the left eyebrow down to just above the left eyelid.  The patient has some scarring but thankfully has no vision loss.  She still has some swelling and some bruising particularly in the left upper lateral aspect of her eyebrow.  She also reports some burning and stinging around the laceration site.  She also has hyperesthesias in that area  Past Medical History:  Diagnosis Date   Cancer ([place])    breast   GERD (gastroesophageal reflux disease)    Hyperlipidemia    Osteopenia    Osteoporosis    Past Surgical History:  Procedure Laterality Date   BREAST SURGERY N/A    Phreesia 09/18/2019   c-sections x 3     HERNIA REPAIR     MASTECTOMY     Current Outpatient Medications on File Prior to Visit  Medication Sig Dispense Refill   calcium carbonate (OS-CAL) 600 MG TABS tablet Take 600 mg by mouth 2 (two) times daily with a meal.     doxycycline (VIBRA-TABS) 100 MG tablet Only take as needed for ocular rosacea     fluticasone (FLONASE) 50 MCG/ACT nasal spray SHAKE LIQUID AND USE 2 SPRAYS IN EACH NOSTRIL DAILY 16 g 6   metroNIDAZOLE (METROCREAM) 0.75 % cream Apply 1 application topically 2 (two) times daily.     Multiple Vitamin (MULTIVITAMIN) tablet Take 1 tablet by mouth daily.     omeprazole (PRILOSEC) 40 MG capsule Take 1 capsule (40 mg total) by mouth daily. 30 capsule 3   No current facility-administered medications on file prior to visit.   Allergies  Allergen Reactions   Penicillins Swelling and Rash    Has patient had a PCN  reaction causing immediate rash, facial/tongue/throat swelling, SOB or lightheadedness with hypotension: Yes Has patient had a PCN reaction causing severe rash involving mucus membranes or skin necrosis: Yes Has patient had a PCN reaction that required hospitalization: No Has patient had a PCN reaction occurring within the last 10 years: No If all of the above answers are "NO", then may proceed with Cephalosporin use.    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  Other Topics Concern   Not on file  Social History Narrative   Not on file   Social Determinants of Health   Financial Resource Strain: Not on file  Food Insecurity: Not on file  Transportation Needs: Not on file  Physical Activity: Not on file  Stress: Not on  file  Social Connections: Not on file  Intimate Partner Violence: Not on file     Review of Systems     Objective:   Physical Exam Vitals reviewed.  HENT:     Head: Normocephalic.     Left Ear: External ear normal.     Mouth/Throat:     Pharynx: No oropharyngeal exudate.  Eyes:   Cardiovascular:     Rate and Rhythm: Normal rate and regular rhythm.     Heart sounds: Normal heart sounds. No murmur heard.   No friction rub. No gallop.  Pulmonary:     Effort: Pulmonary effort is normal. No respiratory distress.     Breath sounds: No wheezing, rhonchi or rales.  Chest:     Chest wall: No tenderness.  Abdominal:     General: Bowel sounds are normal. There is no distension.     Palpations: Abdomen is soft.     Tenderness: There is no  abdominal tenderness. There is no guarding or rebound.  Musculoskeletal:     Cervical back: Neck supple.          Assessment & Plan:  Facial injury, sequela I will give the patient Emla cream to be applied as needed for the hyperesthesias that she is experiencing which is most likely due to scar tissue around the supraorbital nerve.  She can also use Celebrex 200 mg twice daily to help with swelling.  May need to follow-up with plastics if cosmetic appearance is not acceptable at the conclusion of 3 months of healing

## 2020-08-27 ENCOUNTER — Encounter: Payer: Self-pay | Admitting: Family Medicine

## 2020-08-30 ENCOUNTER — Encounter: Payer: Medicare Other | Admitting: Family Medicine

## 2020-09-06 ENCOUNTER — Ambulatory Visit (INDEPENDENT_AMBULATORY_CARE_PROVIDER_SITE_OTHER): Payer: Medicare Other | Admitting: Family Medicine

## 2020-09-06 ENCOUNTER — Other Ambulatory Visit: Payer: Self-pay

## 2020-09-06 ENCOUNTER — Encounter: Payer: Self-pay | Admitting: Family Medicine

## 2020-09-06 VITALS — BP 118/64 | HR 68 | Temp 98.3°F | Resp 16 | Ht 64.5 in | Wt 139.0 lb

## 2020-09-06 DIAGNOSIS — M8580 Other specified disorders of bone density and structure, unspecified site: Secondary | ICD-10-CM | POA: Diagnosis not present

## 2020-09-06 DIAGNOSIS — S0993XS Unspecified injury of face, sequela: Secondary | ICD-10-CM | POA: Diagnosis not present

## 2020-09-06 DIAGNOSIS — Z0001 Encounter for general adult medical examination with abnormal findings: Secondary | ICD-10-CM

## 2020-09-06 DIAGNOSIS — Z780 Asymptomatic menopausal state: Secondary | ICD-10-CM | POA: Diagnosis not present

## 2020-09-06 DIAGNOSIS — E785 Hyperlipidemia, unspecified: Secondary | ICD-10-CM

## 2020-09-06 DIAGNOSIS — Z0000 Encounter for general adult medical examination without abnormal findings: Secondary | ICD-10-CM

## 2020-09-06 NOTE — Progress Notes (Signed)
Subjective:    Patient ID: Brittany House, female    DOB: 09/30/1950, 70 y.o.   MRN: [id number]  HPI Patient is a very sweet 70 year old Caucasian female here today for a complete physical exam.  Her next mammogram is coming due in November.  She schedules this for self.  Her colonoscopy was performed in 2019.  They recommended a repeat colonoscopy in 5 years due to a history of polyps.  Thankfully her biopsies of her polyps in 2019 revealed just hyperplastic polyps.  She scheduled an appoint with her gynecologist in the fall.  She last had a bone density test in 2020.  Is due this year as she does have borderline osteoporosis.  Immunizations are up-to-date except for a booster on Shingrix and the flu shot this fall.  Unfortunately she recently suffered facial trauma.  She was hit by a baseball at a game and suffered a severe laceration above her left orbit.  This left a disfiguring scar running vertically from her eyebrow down to her upper eyelid.  Despite ophthalmology his best effort, the scar persist and the patient would like to meet with a plastic surgeon to see what options she may have to reduce the facial disfigurement.  Otherwise she is doing well with no concerns Immunization History  Administered Date(s) Administered   Fluad Quad(high Dose 65+) 10/14/2018, 11/03/2019   Influenza Split 12/18/2011   Influenza,inj,Quad PF,6+ Mos 11/16/2012, 12/02/2013, 12/26/2014, 11/29/2015, 11/12/2016, 11/30/2017   Influenza-Unspecified 11/03/2016   PFIZER(Purple Top)SARS-COV-2 Vaccination 03/12/2019, 04/06/2019, 06/23/2020   Pneumococcal Conjugate-13 09/28/2017   Pneumococcal Polysaccharide-23 09/14/2018   Tdap 09/15/2011, 07/20/2020   Zoster Recombinat (Shingrix) 02/23/2018   Zoster, Live 07/26/2012   Lab on 08/15/2020  Component Date Value Ref Range Status   WBC 08/15/2020 4.6  3.8 - 10.8 Thousand/uL Final   RBC 08/15/2020 4.67  3.80 - 5.10 Million/uL Final   Hemoglobin 08/15/2020 14.8  11.7 -  15.5 g/dL Final   HCT 08/15/2020 44.3  35.0 - 45.0 % Final   MCV 08/15/2020 94.9  80.0 - 100.0 fL Final   MCH 08/15/2020 31.7  27.0 - 33.0 pg Final   MCHC 08/15/2020 33.4  32.0 - 36.0 g/dL Final   RDW 08/15/2020 12.1  11.0 - 15.0 % Final   Platelets 08/15/2020 270  140 - 400 Thousand/uL Final   MPV 08/15/2020 11.0  7.5 - 12.5 fL Final   Neutro Abs 08/15/2020 2,502  1,500 - 7,800 cells/uL Final   Lymphs Abs 08/15/2020 1,569  850 - 3,900 cells/uL Final   Absolute Monocytes 08/15/2020 419  200 - 950 cells/uL Final   Eosinophils Absolute 08/15/2020 78  15 - 500 cells/uL Final   Basophils Absolute 08/15/2020 32  0 - 200 cells/uL Final   Neutrophils Relative % 08/15/2020 54.4  % Final   Total Lymphocyte 08/15/2020 34.1  % Final   Monocytes Relative 08/15/2020 9.1  % Final   Eosinophils Relative 08/15/2020 1.7  % Final   Basophils Relative 08/15/2020 0.7  % Final   Glucose, Bld 08/15/2020 92  65 - 99 mg/dL Final   Comment: .            Fasting reference interval .    BUN 08/15/2020 15  7 - 25 mg/dL Final   Creat 08/15/2020 0.83  0.50 - 1.05 mg/dL Final   eGFR 08/15/2020 77  > OR = 60 mL/min/1.72m Final   Comment: The eGFR is based on the CKD-EPI 2021 equation. To calculate  the new eGFR  from a previous Creatinine or Cystatin C result, go to https://www.kidney.org/professionals/ kdoqi/gfr%5Fcalculator    BUN/Creatinine Ratio 73/71/0626 NOT APPLICABLE  6 - 22 (calc) Final   Sodium 08/15/2020 140  135 - 146 mmol/L Final   Potassium 08/15/2020 5.6 (A) 3.5 - 5.3 mmol/L Final   Chloride 08/15/2020 105  98 - 110 mmol/L Final   CO2 08/15/2020 24  20 - 32 mmol/L Final   Calcium 08/15/2020 9.3  8.6 - 10.4 mg/dL Final   Total Protein 08/15/2020 6.4  6.1 - 8.1 g/dL Final   Albumin 08/15/2020 4.1  3.6 - 5.1 g/dL Final   Globulin 08/15/2020 2.3  1.9 - 3.7 g/dL (calc) Final   AG Ratio 08/15/2020 1.8  1.0 - 2.5 (calc) Final   Total Bilirubin 08/15/2020 0.5  0.2 - 1.2 mg/dL Final   Alkaline  phosphatase (APISO) 08/15/2020 60  37 - 153 U/L Final   AST 08/15/2020 16  10 - 35 U/L Final   ALT 08/15/2020 17  6 - 29 U/L Final   Hepatitis C Ab 08/15/2020 NON-REACTIVE  NON-REACTIVE Final   SIGNAL TO CUT-OFF 08/15/2020 0.03  <1.00 Final   Comment: . HCV antibody was non-reactive. There is no laboratory  evidence of HCV infection. . In most cases, no further action is required. However, if recent HCV exposure is suspected, a test for HCV RNA (test code [phone]) is suggested. . For additional information please refer to http://education.[facility].com/faq/FAQ22v1 (This link is being provided for informational/ educational purposes only.) .    Cholesterol 08/15/2020 198  <200 mg/dL Final   HDL 08/15/2020 49 (A) > OR = 50 mg/dL Final   Triglycerides 08/15/2020 167 (A) <150 mg/dL Final   LDL Cholesterol (Calc) 08/15/2020 121 (A) mg/dL (calc) Final   Comment: Reference range: <100 . Desirable range <100 mg/dL for primary prevention;   <70 mg/dL for patients with CHD or diabetic patients  with > or = 2 CHD risk factors. Marland Kitchen LDL-C is now calculated using the Martin-Hopkins  calculation, which is a validated novel method providing  better accuracy than the Friedewald equation in the  estimation of LDL-C.  Cresenciano Genre et al. Annamaria Helling. 6270;350(09): 2061-2068  (http://education.[facility].com/faq/FAQ164)    Total CHOL/HDL Ratio 08/15/2020 4.0  <5.0 (calc) Final   Non-HDL Cholesterol (Calc) 08/15/2020 149 (A) <130 mg/dL (calc) Final   Comment: For patients with diabetes plus 1 major ASCVD risk  factor, treating to a non-HDL-C goal of <100 mg/dL  (LDL-C of <70 mg/dL) is considered a therapeutic  option.    Vit D, 25-Hydroxy 08/15/2020 38  30 - 100 ng/mL Final   Comment: Vitamin D Status         25-OH Vitamin D: . Deficiency:                    <20 ng/mL Insufficiency:             20 - 29 ng/mL Optimal:                 > or = 30 ng/mL . For 25-OH Vitamin D testing on  patients on  D2-supplementation and patients for whom quantitation  of D2 and D3 fractions is required, the [facility]ureD(TM) 25-OH VIT D, (D2,D3), LC/MS/MS is recommended: order  code [phone] (patients >68yr). See Note 1 . Note 1 . For additional information, please refer to  http://education.[facility].com/faq/FAQ199  (This link is being provided for informational/ educational purposes only.)      Past Medical History:  Diagnosis  Date   Cancer [facility])    breast   GERD (gastroesophageal reflux disease)    Hyperlipidemia    Osteopenia    Osteoporosis    Past Surgical History:  Procedure Laterality Date   BREAST SURGERY N/A    [facility]ia 09/18/2019   c-sections x 3     HERNIA REPAIR     MASTECTOMY     Current Outpatient Medications on File Prior to Visit  Medication Sig Dispense Refill   calcium carbonate (OS-CAL) 600 MG TABS tablet Take 600 mg by mouth 2 (two) times daily with a meal.     celecoxib (CELEBREX) 200 MG capsule Take 1 capsule (200 mg total) by mouth 2 (two) times daily. 60 capsule 0   doxycycline (VIBRA-TABS) 100 MG tablet Only take as needed for ocular rosacea     fluticasone (FLONASE) 50 MCG/ACT nasal spray SHAKE LIQUID AND USE 2 SPRAYS IN EACH NOSTRIL DAILY 16 g 6   lidocaine-prilocaine (EMLA) cream Apply 1 application topically as needed. 30 g 0   metroNIDAZOLE (METROCREAM) 0.75 % cream Apply 1 application topically 2 (two) times daily.     Multiple Vitamin (MULTIVITAMIN) tablet Take 1 tablet by mouth daily.     omeprazole (PRILOSEC) 40 MG capsule Take 1 capsule (40 mg total) by mouth daily. (Patient taking differently: Take 40 mg by mouth daily as needed.) 30 capsule 3   No current facility-administered medications on file prior to visit.   Allergies  Allergen Reactions   Penicillins Swelling and Rash    Has patient had a PCN reaction causing immediate rash, facial/tongue/throat swelling, SOB or lightheadedness with hypotension: Yes Has patient had  a PCN reaction causing severe rash involving mucus membranes or skin necrosis: Yes Has patient had a PCN reaction that required hospitalization: No Has patient had a PCN reaction occurring within the last 10 years: No If all of the above answers are "NO", then may proceed with Cephalosporin use.    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  Other Topics Concern   Not on file  Social History Narrative   Not on file   Social Determinants of Health   Financial Resource Strain: Not on file  Food Insecurity: Not on file  Transportation Needs: Not on file  Physical Activity: Not on file  Stress: Not on file  Social Connections: Not on file  Intimate Partner Violence: Not on file   Family History  Problem Relation Age of Onset   Heart disease Neg Hx    Cancer Neg Hx       Review of Systems     Objective:   Physical Exam Vitals reviewed.  Constitutional:      General: She is not in acute distress.    Appearance: Normal appearance. She is normal weight. She is not diaphoretic.  HENT:     Head: Normocephalic. Laceration present.      Right Ear: Tympanic membrane, ear canal and external ear normal.  Left Ear: Tympanic membrane, ear canal and external ear normal.     Nose: Mucosal edema and rhinorrhea present.     Right Nostril: Epistaxis present.     Left Nostril: Epistaxis present.     Left Sinus: Maxillary sinus tenderness and frontal sinus tenderness present.     Mouth/Throat:     Mouth: Mucous membranes are moist.     Pharynx: No oropharyngeal  exudate or posterior oropharyngeal erythema.  Eyes:     General: No scleral icterus.       Right eye: No discharge.        Left eye: No discharge.     Conjunctiva/sclera: Conjunctivae normal.     Pupils: Pupils are equal, round, and reactive to light.  Neck:     Vascular: No carotid bruit.  Cardiovascular:     Rate and Rhythm: Normal rate and regular rhythm.     Pulses: Normal pulses.     Heart sounds: Normal heart sounds. No murmur heard.   No friction rub. No gallop.  Pulmonary:     Effort: Pulmonary effort is normal. No respiratory distress.     Breath sounds: Normal breath sounds. No stridor. No wheezing or rales.  Chest:     Chest wall: No tenderness.  Abdominal:     General: Bowel sounds are normal. There is no distension.     Palpations: Abdomen is soft. There is no mass.     Tenderness: There is no abdominal tenderness. There is no guarding or rebound.  Musculoskeletal:        General: No tenderness. Normal range of motion.     Cervical back: Normal range of motion and neck supple. No rigidity or tenderness.     Right lower leg: No edema.     Left lower leg: No edema.  Lymphadenopathy:     Cervical: No cervical adenopathy.  Skin:    General: Skin is warm.     Coloration: Skin is not jaundiced or pale.     Findings: No bruising, erythema, lesion or rash.  Neurological:     General: No focal deficit present.     Mental Status: She is alert and oriented to person, place, and time. Mental status is at baseline.     Cranial Nerves: No cranial nerve deficit.     Sensory: No sensory deficit.     Motor: No weakness.     Coordination: Coordination normal.     Gait: Gait normal.  Psychiatric:        Behavior: Behavior normal.        Thought Content: Thought content normal.        Judgment: Judgment normal.          Assessment & Plan:  Osteopenia, unspecified location - Plan: DG Bone Density  Postmenopausal estrogen deficiency - Plan: DG Bone Density  Facial  trauma, sequela - Plan: Ambulatory referral to Plastic Surgery Patient's 10-year risk for cardiovascular disease is less than 7.5% at 6.1 based on her blood pressure and her cholesterol.  Therefore she does not require statin.  I will schedule the patient for a bone density to monitor progression of her osteopenia.  I did recommend a booster on her shingles vaccine as well as a flu shot this fall.  Her mammogram will be scheduled in November at her gynecologist office along with her Pap smear.  The remainder of her physical exam today is completely normal.  I will consult plastic surgery regarding the scar above her left  orbit from the trauma.

## 2020-09-14 DIAGNOSIS — Z20822 Contact with and (suspected) exposure to covid-19: Secondary | ICD-10-CM | POA: Diagnosis not present

## 2020-09-17 DIAGNOSIS — S01112S Laceration without foreign body of left eyelid and periocular area, sequela: Secondary | ICD-10-CM | POA: Diagnosis not present

## 2020-09-18 DIAGNOSIS — S01112S Laceration without foreign body of left eyelid and periocular area, sequela: Secondary | ICD-10-CM | POA: Diagnosis not present

## 2020-10-22 DIAGNOSIS — L820 Inflamed seborrheic keratosis: Secondary | ICD-10-CM | POA: Diagnosis not present

## 2020-10-22 DIAGNOSIS — D692 Other nonthrombocytopenic purpura: Secondary | ICD-10-CM | POA: Diagnosis not present

## 2020-10-22 DIAGNOSIS — L905 Scar conditions and fibrosis of skin: Secondary | ICD-10-CM | POA: Diagnosis not present

## 2020-10-22 DIAGNOSIS — S01112S Laceration without foreign body of left eyelid and periocular area, sequela: Secondary | ICD-10-CM | POA: Diagnosis not present

## 2020-10-22 DIAGNOSIS — L821 Other seborrheic keratosis: Secondary | ICD-10-CM | POA: Diagnosis not present

## 2020-10-31 DIAGNOSIS — H04123 Dry eye syndrome of bilateral lacrimal glands: Secondary | ICD-10-CM | POA: Diagnosis not present

## 2020-10-31 DIAGNOSIS — H0102A Squamous blepharitis right eye, upper and lower eyelids: Secondary | ICD-10-CM | POA: Diagnosis not present

## 2020-10-31 DIAGNOSIS — H2513 Age-related nuclear cataract, bilateral: Secondary | ICD-10-CM | POA: Diagnosis not present

## 2020-10-31 DIAGNOSIS — H43813 Vitreous degeneration, bilateral: Secondary | ICD-10-CM | POA: Diagnosis not present

## 2020-11-06 ENCOUNTER — Other Ambulatory Visit: Payer: Self-pay

## 2020-11-06 ENCOUNTER — Ambulatory Visit
Admission: RE | Admit: 2020-11-06 | Discharge: 2020-11-06 | Disposition: A | Discharge status: Home / Self Care | Payer: Medicare Other | Source: Ambulatory Visit | Attending: Family Medicine | Admitting: Family Medicine

## 2020-11-06 DIAGNOSIS — M85852 Other specified disorders of bone density and structure, left thigh: Secondary | ICD-10-CM | POA: Diagnosis not present

## 2020-11-06 DIAGNOSIS — Z780 Asymptomatic menopausal state: Secondary | ICD-10-CM

## 2020-11-06 DIAGNOSIS — M8580 Other specified disorders of bone density and structure, unspecified site: Secondary | ICD-10-CM

## 2020-11-06 DIAGNOSIS — Z20822 Contact with and (suspected) exposure to covid-19: Secondary | ICD-10-CM | POA: Diagnosis not present

## 2020-11-07 ENCOUNTER — Encounter: Payer: Self-pay | Admitting: Family Medicine

## 2020-11-20 ENCOUNTER — Ambulatory Visit (INDEPENDENT_AMBULATORY_CARE_PROVIDER_SITE_OTHER): Payer: Medicare Other | Admitting: Family Medicine

## 2020-11-20 ENCOUNTER — Other Ambulatory Visit: Payer: Self-pay

## 2020-11-20 ENCOUNTER — Encounter: Payer: Self-pay | Admitting: Family Medicine

## 2020-11-20 VITALS — BP 122/68 | HR 76 | Temp 98.1°F | Resp 16 | Ht 64.5 in | Wt 137.0 lb

## 2020-11-20 DIAGNOSIS — J301 Allergic rhinitis due to pollen: Secondary | ICD-10-CM

## 2020-11-20 DIAGNOSIS — J0101 Acute recurrent maxillary sinusitis: Secondary | ICD-10-CM

## 2020-11-20 MED ORDER — LORATADINE 10 MG PO TABS
10.0000 mg | ORAL_TABLET | Freq: Every day | ORAL | 11 refills | Status: DC
Start: 2020-11-20 — End: 2022-01-13

## 2020-11-20 MED ORDER — MONTELUKAST SODIUM 10 MG PO TABS: 10.0000 mg | ORAL_TABLET | Freq: Every day | ORAL | 3 refills | Status: DC

## 2020-11-20 MED ORDER — CEFDINIR 300 MG PO CAPS: 300.0000 mg | ORAL_CAPSULE | Freq: Two times a day (BID) | ORAL | 0 refills | Status: DC

## 2020-11-20 MED ORDER — PREDNISONE 20 MG PO TABS: ORAL_TABLET | ORAL | 0 refills | Status: DC

## 2020-11-20 NOTE — Progress Notes (Signed)
Subjective:    Patient ID: Brittany House, female    DOB: [birth year], 70 y.o.   MRN: [id number]  HPI  Patient battles chronic allergies.  She is usually on Zyrtec and Flonase.  She has tried Astelin in the past with no relief.  With the change of seasons, she has been dealing with allergies and sinuses for the last 3 weeks.  She reports head congestion and rhinorrhea.  She reports feeling stuffy and unable to breathe through her nostril.  Over the last week it intensified greatly.  She now has pain in the roof of her mouth.  The mucosa on the upper palate is erythematous and tender.  She also has pain in her right maxillary sinus which has tenderness to percussion.  She reports a headache but no fevers.  She had to stop Flonase because it seemed to actually exacerbate her head congestion.  Zyrtec leaves her feeling "like a zombie". Past Medical History:  Diagnosis Date   Cancer ([place])    breast   GERD (gastroesophageal reflux disease)    Hyperlipidemia    Osteopenia    Osteoporosis    Past Surgical History:  Procedure Laterality Date   BREAST SURGERY N/A    [facility]reesia 09/18/2019   c-sections x 3     HERNIA REPAIR     MASTECTOMY     Current Outpatient Medications on File Prior to Visit  Medication Sig Dispense Refill   calcium carbonate (OS-CAL) 600 MG TABS tablet Take 600 mg by mouth 2 (two) times daily with a meal.     celecoxib (CELEBREX) 200 MG capsule Take 1 capsule (200 mg total) by mouth 2 (two) times daily. 60 capsule 0   doxycycline (VIBRA-TABS) 100 MG tablet Only take as needed for ocular rosacea     fluticasone (FLONASE) 50 MCG/ACT nasal spray SHAKE LIQUID AND USE 2 SPRAYS IN EACH NOSTRIL DAILY 16 g 6   lidocaine-prilocaine (EMLA) cream Apply 1 application topically as needed. 30 g 0   metroNIDAZOLE (METROCREAM) 0.75 % cream Apply 1 application topically 2 (two) times daily.     Multiple Vitamin (MULTIVITAMIN) tablet Take 1 tablet by mouth daily.     omeprazole (PRILOSEC) 40 MG  capsule Take 1 capsule (40 mg total) by mouth daily. (Patient taking differently: Take 40 mg by mouth daily as needed.) 30 capsule 3   No current facility-administered medications on file prior to visit.   Allergies  Allergen Reactions   Penicillins Swelling and Rash    Has patient had a PCN reaction causing immediate rash, facial/tongue/throat swelling, SOB or lightheadedness with hypotension: Yes Has patient had a PCN reaction causing severe rash involving mucus membranes or skin necrosis: Yes Has patient had a PCN reaction that required hospitalization: No Has patient had a PCN reaction occurring within the last 10 years: No If all of the above answers are "NO", then may proceed with Cephalosporin use.    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  Other Topics Concern   Not on file  Social History Narrative   Not on file   Social Determinants of Health   Financial Resource Strain: Not on file  Food Insecurity: Not on file  Transportation Needs: Not on file  Physical Activity: Not on file  Stress: Not on file  Social Connections: Not on file  Intimate Partner Violence: Not on file     Review of Systems  All other systems reviewed and are negative.     Objective:   Physical Exam Vitals reviewed.  Constitutional:      Appearance: Normal appearance. She is well-developed and normal weight.  HENT:     Head: Normocephalic and atraumatic.     Right Ear: Tympanic membrane, ear canal and external ear normal.     Left Ear: Tympanic membrane, ear canal and  external ear normal.     Nose: Mucosal edema, congestion and rhinorrhea present.     Right Turbinates: Enlarged and swollen.     Left Turbinates: Enlarged and swollen.     Right Sinus: Maxillary sinus tenderness and frontal sinus tenderness present.     Mouth/Throat:     Pharynx: No oropharyngeal exudate.  Eyes:     General: Allergic shiner present.  Cardiovascular:     Rate and Rhythm: Normal rate and regular rhythm.     Heart sounds: Normal heart sounds. No murmur heard.   No friction rub. No gallop.  Pulmonary:     Effort: Pulmonary effort is normal. No respiratory distress.     Breath sounds: No wheezing, rhonchi or rales.  Chest:     Chest wall: No tenderness.  Abdominal:     General: Bowel sounds are normal. There is no distension.     Palpations: Abdomen is soft.     Tenderness: There is no abdominal tenderness. There is no guarding or rebound.  Musculoskeletal:     Cervical back: Neck supple.  Neurological:     Mental Status: She is alert.    Both eyelids are swollen.  The skin is violaceous in color and looks like allergic shiners.  Eyes are itchy and watery.      Assessment & Plan:  Seasonal allergic rhinitis due to pollen  Acute recurrent maxillary sinusitis I believe the majority the patient's symptoms are allergies however I do believe she developed a secondary right maxillary sinus infection.  I will start the patient on a prednisone taper pack to try to get control of her allergies given their severity.  I recommended that she discontinue Zyrtec and try Claritin 10 mg a day along with Singulair 10 mg a day if she is unable to tolerate the Flonase and if she felt like the Astelin was not effective.  I will treat the sinus infection with Omnicef 300 mg p.o. twice daily for 10 days.  We discussed her history of an allergy to penicillins and she is still willing to try the cephalosporin.  She wants to avoid Levaquin due to potential tendinitis and she has had poor  success with Z-Pak's in the past

## 2020-11-21 ENCOUNTER — Telehealth: Payer: Self-pay | Admitting: Nurse Practitioner

## 2020-11-21 NOTE — Telephone Encounter (Signed)
Received page from patient 10/18 around 6 pm.  I returned patient's call.  She was prescribed Cefdinir for sinus infection by PCP and when she got home, remembered that she had diarrhea and stomach pain previously when prescribed this medication.  I discussed with her that those are side effects of antibiotics and she can try to take medication with food and also try to take with probiotic to help minimize side effects.  Patient will notify us if she does these things and still has severe side effects.  She mentioned that she has tolerated "z packs" well in the past.

## 2020-11-26 ENCOUNTER — Ambulatory Visit (INDEPENDENT_AMBULATORY_CARE_PROVIDER_SITE_OTHER): Payer: Medicare Other | Admitting: Plastic Surgery

## 2020-11-26 ENCOUNTER — Encounter: Payer: Self-pay | Admitting: Plastic Surgery

## 2020-11-26 ENCOUNTER — Other Ambulatory Visit: Payer: Self-pay

## 2020-11-26 DIAGNOSIS — H02831 Dermatochalasis of right upper eyelid: Secondary | ICD-10-CM

## 2020-11-26 DIAGNOSIS — H02833 Dermatochalasis of right eye, unspecified eyelid: Secondary | ICD-10-CM | POA: Insufficient documentation

## 2020-11-26 DIAGNOSIS — H02836 Dermatochalasis of left eye, unspecified eyelid: Secondary | ICD-10-CM | POA: Insufficient documentation

## 2020-11-26 DIAGNOSIS — H02834 Dermatochalasis of left upper eyelid: Secondary | ICD-10-CM | POA: Diagnosis not present

## 2020-11-26 DIAGNOSIS — L905 Scar conditions and fibrosis of skin: Secondary | ICD-10-CM | POA: Insufficient documentation

## 2020-11-26 NOTE — Progress Notes (Signed)
Patient ID: Brittany House, female    DOB: 1950-11-04, 70 y.o.   MRN: [id number]   Chief Complaint  Patient presents with   Advice Only   Skin Problem    The patient is a 70 year old female here with her husband for evaluation of her forehead and left eyelid.  The patient is 5 feet 4 inches tall and weighs 139 pounds months ago she was at a ball game when a baseball struck her in the left orbital area.  Since then she has noticed asymmetry.  She had some fullness of her forehead.  She had a Kenalog injection to try to improve this.  She now has severe asymmetry with accentuation of her dermatochalasis and a mild upper lid ptosis.  Her levator function is questionable with her 13 years and on blue sides.  Her upper lids seem to be resting on her superior pupil line.  Patient notices that she is having to tilt her head up at night to read or watch TV.  She also finds her lids are quite heavy.  She has a past medical history of hyperlipidemia and breast cancer.  She is otherwise in stable condition.  The wound looks like it was lined up nicely but she has had a divot between the medial and lateral aspect.  This may be where there is been a separation in the muscle of that area.  She has quite a bit of fat herniation of her upper lids and it is certainly worse on the left side.   Review of Systems  Constitutional: Negative.   Eyes: Negative.   Respiratory: Negative.    Cardiovascular: Negative.   Gastrointestinal: Negative.   Endocrine: Negative.   Genitourinary: Negative.   Hematological: Negative.   Psychiatric/Behavioral: Negative.     Past Medical History:  Diagnosis Date   Cancer ([place])    breast   GERD (gastroesophageal reflux disease)    Hyperlipidemia    Osteopenia    Osteoporosis     Past Surgical History:  Procedure Laterality Date   BREAST SURGERY N/A    [facility] 09/18/2019   c-sections x 3     HERNIA REPAIR     MASTECTOMY        Current Outpatient Medications:     calcium carbonate (OS-CAL) 600 MG TABS tablet, Take 600 mg by mouth 2 (two) times daily with a meal., Disp: , Rfl:    cefdinir (OMNICEF) 300 MG capsule, Take 1 capsule (300 mg total) by mouth 2 (two) times daily., Disp: 20 capsule, Rfl: 0   doxycycline (VIBRA-TABS) 100 MG tablet, Only take as needed for ocular rosacea, Disp: , Rfl:    loratadine (CLARITIN) 10 MG tablet, Take 1 tablet (10 mg total) by mouth daily., Disp: 30 tablet, Rfl: 11   metroNIDAZOLE (METROCREAM) 0.75 % cream, Apply 1 application topically 2 (two) times daily., Disp: , Rfl:    montelukast (SINGULAIR) 10 MG tablet, Take 1 tablet (10 mg total) by mouth at bedtime., Disp: 30 tablet, Rfl: 3   Multiple Vitamin (MULTIVITAMIN) tablet, Take 1 tablet by mouth daily., Disp: , Rfl:    omeprazole (PRILOSEC) 40 MG capsule, Take 1 capsule (40 mg total) by mouth daily., Disp: 30 capsule, Rfl: 3   Objective:   Vitals:   11/26/20 1212  BP: (!) 147/88  Pulse: 73  SpO2: 96%    Physical Exam Vitals and nursing note reviewed.  Constitutional:      Appearance: Normal appearance.  HENT:  Head: Normocephalic and atraumatic.  Cardiovascular:     Rate and Rhythm: Normal rate.     Pulses: Normal pulses.  Pulmonary:     Effort: Pulmonary effort is normal.  Abdominal:     General: Abdomen is flat.  Skin:    General: Skin is warm.  Neurological:     Mental Status: She is alert and oriented to person, place, and time.  Psychiatric:        Mood and Affect: Mood normal.        Behavior: Behavior normal.        Thought Content: Thought content normal.    Assessment & Plan:  Dermatochalasis of both upper eyelids  Skin scar contracture  Recommend visual field exam.  I also talked to her about upper lid cyst repair.  Dr. Erin Hearing saw her as well.  Would like to have her come back after she has the visual field exam and talk about her options for possible scar excision scar contracture release revision and upper lid blepharoplasty.  We  will also determine whether or not she needs her lids worked on as well.  Pictures were obtained of the patient and placed in the chart with the patient's or guardian's permission.    [place], DO

## 2020-11-29 ENCOUNTER — Encounter: Payer: Self-pay | Admitting: Family Medicine

## 2020-12-06 ENCOUNTER — Ambulatory Visit (INDEPENDENT_AMBULATORY_CARE_PROVIDER_SITE_OTHER): Payer: Medicare Other | Admitting: Family Medicine

## 2020-12-06 ENCOUNTER — Other Ambulatory Visit: Payer: Self-pay

## 2020-12-06 DIAGNOSIS — Z23 Encounter for immunization: Secondary | ICD-10-CM

## 2020-12-07 ENCOUNTER — Encounter: Payer: Self-pay | Admitting: Family Medicine

## 2020-12-11 ENCOUNTER — Other Ambulatory Visit: Payer: Self-pay | Admitting: Family Medicine

## 2020-12-11 MED ORDER — BENZONATATE 200 MG PO CAPS: 200.0000 mg | ORAL_CAPSULE | Freq: Two times a day (BID) | ORAL | 0 refills | Status: DC | PRN

## 2020-12-17 DIAGNOSIS — Z1231 Encounter for screening mammogram for malignant neoplasm of breast: Secondary | ICD-10-CM | POA: Diagnosis not present

## 2020-12-17 DIAGNOSIS — Z853 Personal history of malignant neoplasm of breast: Secondary | ICD-10-CM | POA: Diagnosis not present

## 2020-12-20 ENCOUNTER — Encounter: Payer: Self-pay | Admitting: Family Medicine

## 2020-12-25 ENCOUNTER — Institutional Professional Consult (permissible substitution): Payer: BC Managed Care – PPO | Admitting: Plastic Surgery

## 2021-01-10 DIAGNOSIS — Z20822 Contact with and (suspected) exposure to covid-19: Secondary | ICD-10-CM | POA: Diagnosis not present

## 2021-02-14 DIAGNOSIS — Z20822 Contact with and (suspected) exposure to covid-19: Secondary | ICD-10-CM | POA: Diagnosis not present

## 2021-02-27 DIAGNOSIS — H57812 Brow ptosis, left: Secondary | ICD-10-CM | POA: Diagnosis not present

## 2021-03-05 ENCOUNTER — Encounter: Payer: Self-pay | Admitting: Plastic Surgery

## 2021-03-05 ENCOUNTER — Ambulatory Visit (INDEPENDENT_AMBULATORY_CARE_PROVIDER_SITE_OTHER): Payer: Medicare Other | Admitting: Plastic Surgery

## 2021-03-05 ENCOUNTER — Other Ambulatory Visit: Payer: Self-pay

## 2021-03-05 DIAGNOSIS — L905 Scar conditions and fibrosis of skin: Secondary | ICD-10-CM

## 2021-03-05 NOTE — Progress Notes (Addendum)
° °  Subjective:    Patient ID: Brittany House, female    DOB: 11/25/1950, 71 y.o.   MRN: [id number]  The patient is a 71 year old female joining me by phone for further discussion about her lids.  She had a traumatic accident when she was struck by a baseball at a ball game several months ago.  She has noticed increasing asymmetry and a fullness of the left forehead and brow area.  She had a Kenalog injection but it did not seem to improve it.  She has a little bit of dermatochalasis with significant asymmetry with scar contracture of the left upper lid.  She had a visual field exam which was not significantly different with taping versus on taping.  Her past medical history is positive for hyperlipidemia and breast cancer.  We reviewed her options today.     Review of Systems  Constitutional: Negative.   Eyes: Negative.   Respiratory: Negative.    Cardiovascular: Negative.   Gastrointestinal: Negative.   Endocrine: Negative.   Genitourinary: Negative.   Neurological: Negative.   Hematological: Negative.       Objective:   Physical Exam      Assessment & Plan:     ICD-10-CM   1. Skin scar contracture  L90.5       I connected with  Brittany House on 03/10/21 by phone and verified that I am speaking with the correct person using two identifiers.  The patient was at home and I was at the office.  I spent 15 minutes in the following manner: review of the visual field exam, review of the chart, discussion with the patient and documentation.   I discussed the limitations of evaluation and management by telemedicine. The patient expressed understanding and agreed to proceed.  I think the patient would benefit from a scar contracture revision release of the left brow and a tissue rearrangement of the upper lid on the left side.  I think that she would need to do the right side for symmetry.  We can provide her with a quote and submit to insurance for the left side and see what we can do.

## 2021-03-07 ENCOUNTER — Encounter: Payer: Self-pay | Admitting: Plastic Surgery

## 2021-03-08 IMAGING — CT CT ABD-PELV W/ CM
2 of 5 series · 16 of 46 positions shown, 18 images · IV contrast (APPLIED)
Comparison: None.

CLINICAL DATA: Left lower quadrant pain

EXAM:
CT ABDOMEN AND PELVIS WITH CONTRAST
TECHNIQUE: Multidetector CT imaging of the abdomen and pelvis was performed
using the standard protocol following bolus administration of
intravenous contrast.
CONTRAST:  100mL OMNIPAQUE IOHEXOL 300 MG/ML  SOLN

[Series 2: axial st · axial · 0.72mm/px · z∈[-542,-152]mm · 13 of 90 slices shown, 15 images]
[im 6/90  soft-tissue]
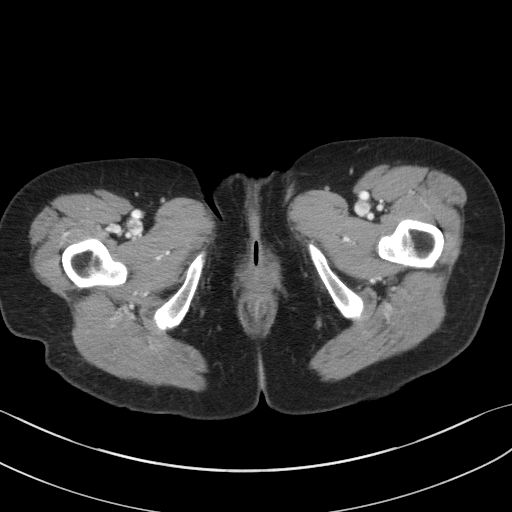
[im 6/90  bone]
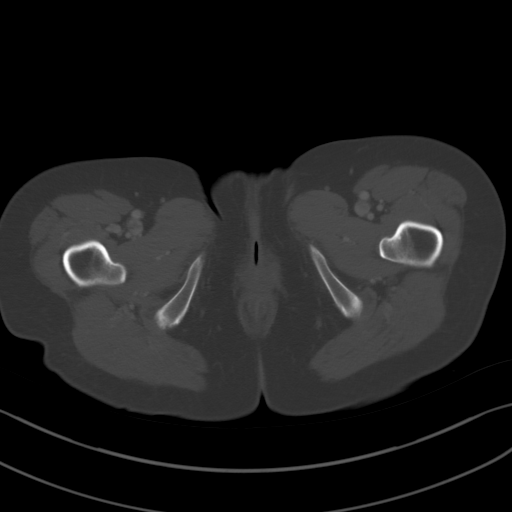
[im 12/90  soft-tissue]
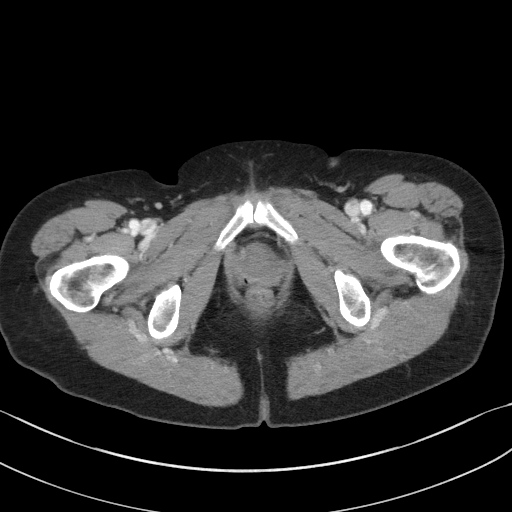
[im 17/90  soft-tissue]
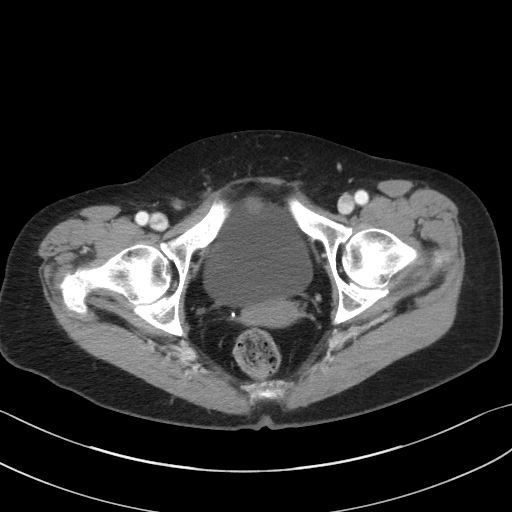
[im 28/90  soft-tissue]
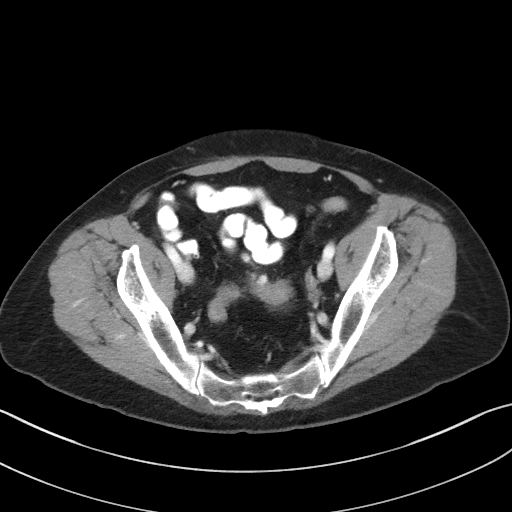
[im 34/90  soft-tissue]
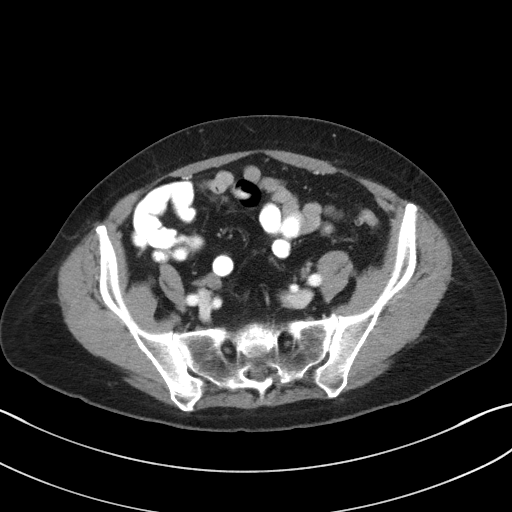
[im 39/90  soft-tissue]
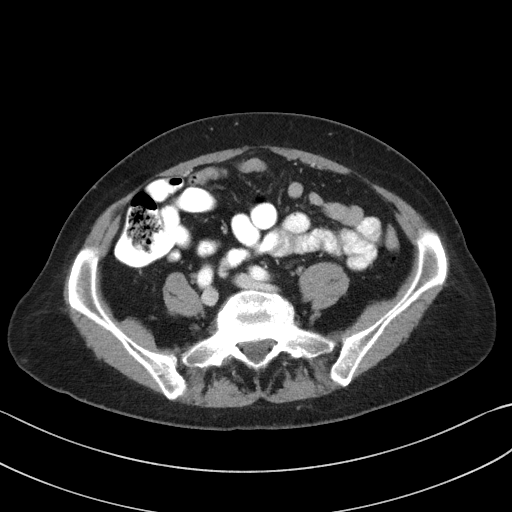
[im 45/90  soft-tissue]
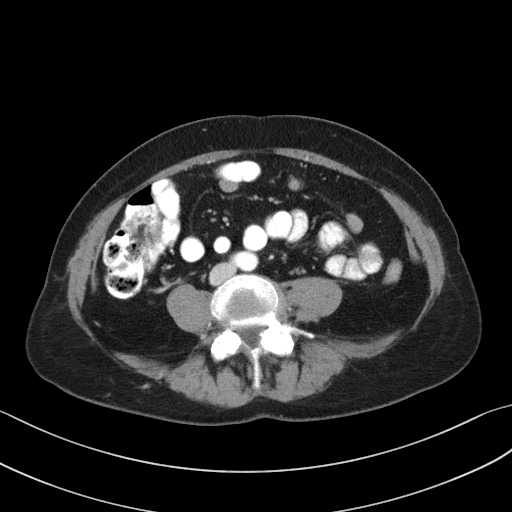
[im 51/90  soft-tissue]
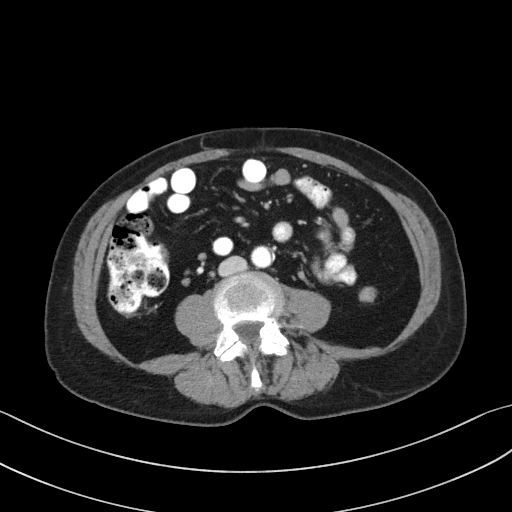
[im 56/90  soft-tissue]
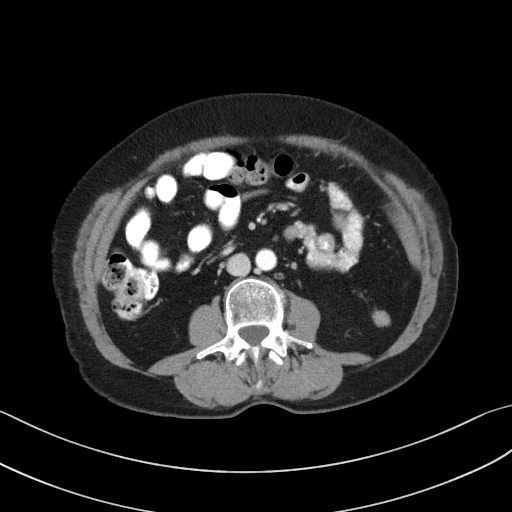
[im 56/90  bone]
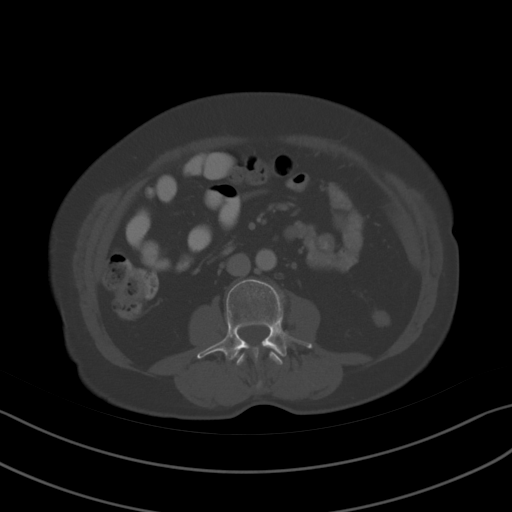
[im 62/90  soft-tissue]
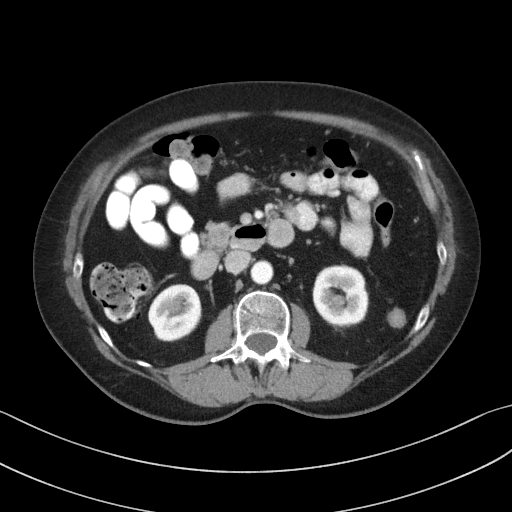
[im 73/90  soft-tissue]
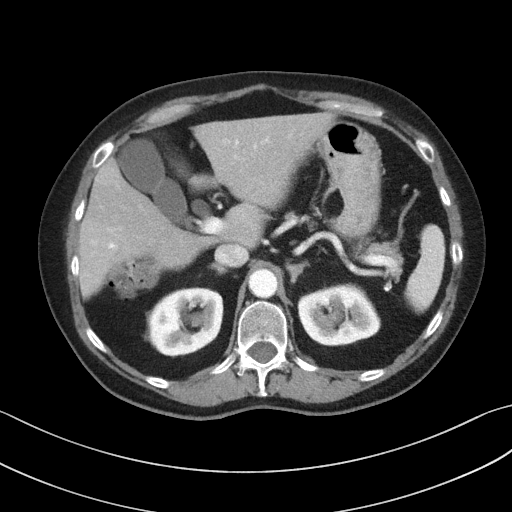
[im 78/90  soft-tissue]
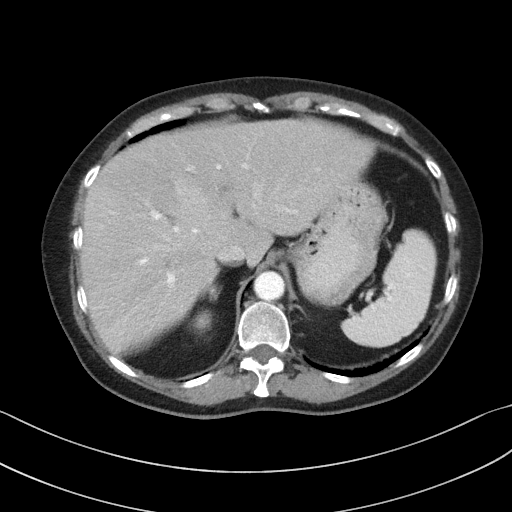
[im 84/90  soft-tissue]
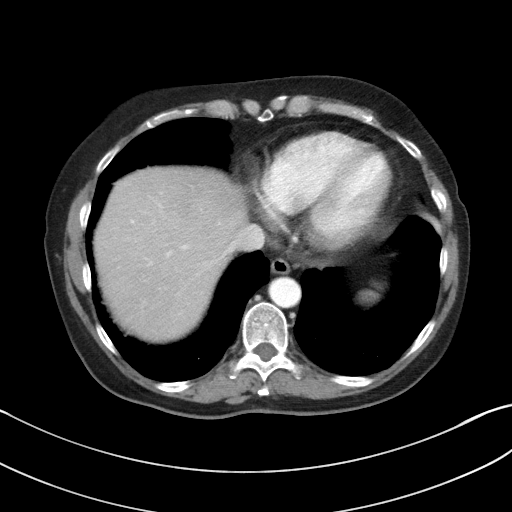

[Series 4: coronal st · coronal · 0.76mm/px · 3 of 89 slices shown]
[im 30/89  soft-tissue]
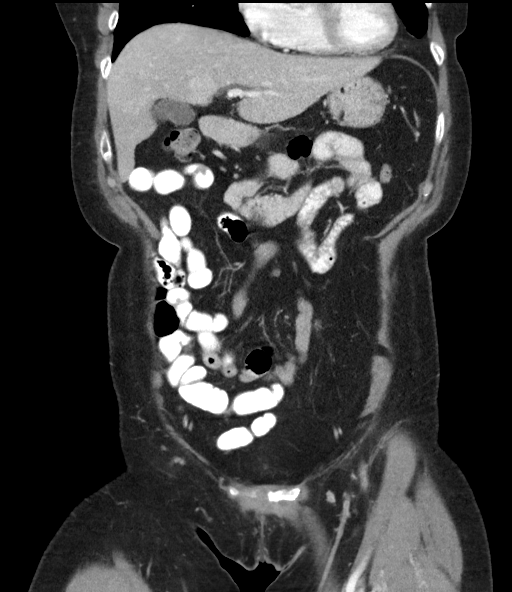
[im 40/89  soft-tissue]
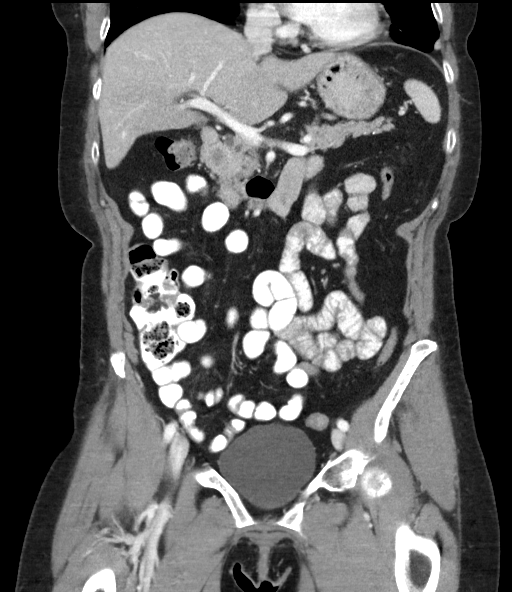
[im 49/89  soft-tissue]
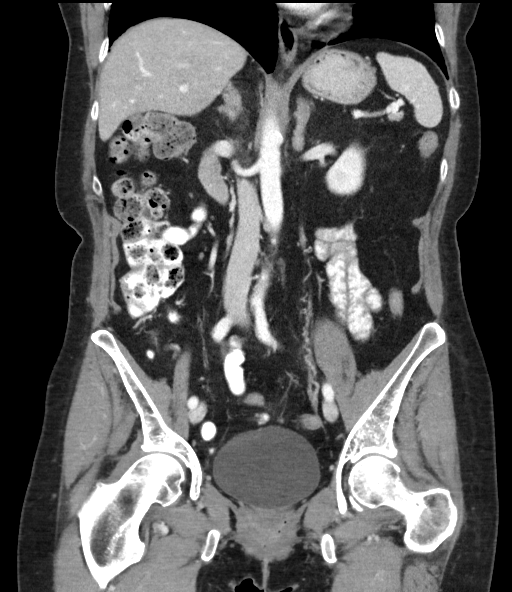

[16 of 46 positions shown; findings below may reference images not displayed]

FINDINGS: Lower chest: Subsegmental atelectasis/scarring. Partially imaged
left breast implant.

Hepatobiliary: No focal liver abnormality is seen. No gallstones,
gallbladder wall thickening, or biliary dilatation.

Pancreas: Unremarkable.

Spleen: Unremarkable.

Adrenals/Urinary Tract: Adrenals are unremarkable. There is a 2 mm
nonobstructing calculus of the lower pole of the left kidney.
Bilateral renal pelviectasis. Ureters are normal in caliber. Bladder
is unremarkable.

Stomach/Bowel: Stomach is within normal limits. Small duodenal
diverticulum. Bowel is normal in caliber. Few colonic diverticula
without evidence of diverticulitis. Normal appendix.

Vascular/Lymphatic: Mild aortic atherosclerosis. No enlarged lymph
nodes.

Reproductive: Uterus and bilateral adnexa are unremarkable.

Other: No ascites.  Abdominal wall is unremarkable.

Musculoskeletal: No acute osseous abnormality.
IMPRESSION: No acute abnormality.

2 mm nonobstructing calculus of the left kidney. Bilateral renal
pelviectasis without apparent obstruction.

Mild colonic diverticulosis.

## 2021-03-11 DIAGNOSIS — H0102B Squamous blepharitis left eye, upper and lower eyelids: Secondary | ICD-10-CM | POA: Diagnosis not present

## 2021-03-11 DIAGNOSIS — H10823 Rosacea conjunctivitis, bilateral: Secondary | ICD-10-CM | POA: Diagnosis not present

## 2021-03-11 DIAGNOSIS — H0102A Squamous blepharitis right eye, upper and lower eyelids: Secondary | ICD-10-CM | POA: Diagnosis not present

## 2021-03-11 DIAGNOSIS — H04123 Dry eye syndrome of bilateral lacrimal glands: Secondary | ICD-10-CM | POA: Diagnosis not present

## 2021-03-12 ENCOUNTER — Encounter: Payer: Self-pay | Admitting: Family Medicine

## 2021-03-12 ENCOUNTER — Ambulatory Visit (INDEPENDENT_AMBULATORY_CARE_PROVIDER_SITE_OTHER): Payer: Medicare Other | Admitting: Family Medicine

## 2021-03-12 ENCOUNTER — Other Ambulatory Visit: Payer: Self-pay

## 2021-03-12 VITALS — BP 108/68 | HR 77 | Temp 97.1°F | Resp 18 | Ht 64.5 in | Wt 134.0 lb

## 2021-03-12 DIAGNOSIS — G44309 Post-traumatic headache, unspecified, not intractable: Secondary | ICD-10-CM

## 2021-03-12 NOTE — Progress Notes (Signed)
Subjective:    Patient ID: Brittany House, female    DOB: 1951-01-06, 71 y.o.   MRN: [id number]  Headache  7 months ago, the patient was struck above her left eye by a baseball at a baseball game.  She suffered a concussion and a laceration that ultimately required specialist closure.  There was no fracture seen on CT scan.  However she has persistent headaches ever since.  She states that they occur 3-4 times a week.  It is usually first thing in the morning.  She reports a dull pressure-like headache above her left eye.  There is no photophobia or phonophobia.  There is no nausea or vomiting.  There is no dizziness.  There is no neurologic deficit.  She takes an Advil the headache will typically subside with rest of the day.  The headaches are not worsening but she is concerned due to the persistent nature Past Medical History:  Diagnosis Date   Cancer ([place])    breast   GERD (gastroesophageal reflux disease)    Hyperlipidemia    Osteopenia    Osteoporosis    Past Surgical History:  Procedure Laterality Date   BREAST SURGERY N/A    [employer] 09/18/2019   c-sections x 3     HERNIA REPAIR     MASTECTOMY     Current Outpatient Medications on File Prior to Visit  Medication Sig Dispense Refill   CALCIUM 600 1500 (600 Ca) MG TABS tablet SMARTSIG:1 By Mouth     D 1000 25 MCG (1000 UT) capsule SMARTSIG:1 By Mouth     doxycycline (VIBRAMYCIN) 100 MG capsule Take 100 mg by mouth 2 (two) times daily.     loratadine (CLARITIN) 10 MG tablet Take 1 tablet (10 mg total) by mouth daily. 30 tablet 11   Multiple Vitamin (MULTIVITAMIN) tablet Take 1 tablet by mouth daily.     omeprazole (PRILOSEC) 40 MG capsule Take 1 capsule (40 mg total) by mouth daily. 30 capsule 3   alendronate (FOSAMAX) 70 MG tablet  (Patient not taking: Reported on 03/12/2021)     montelukast (SINGULAIR) 10 MG tablet Take 1 tablet (10 mg total) by mouth at bedtime. (Patient not taking: Reported on 03/12/2021) 30 tablet 3   No  current facility-administered medications on file prior to visit.   Allergies  Allergen Reactions   Penicillins Swelling and Rash    Has patient had a PCN reaction causing immediate rash, facial/tongue/throat swelling, SOB or lightheadedness with hypotension: Yes Has patient had a PCN reaction causing severe rash involving mucus membranes or skin necrosis: Yes Has patient had a PCN reaction that required hospitalization: No Has patient had a PCN reaction occurring within the last 10 years: No If all of the above answers are "NO", then may proceed with Cephalosporin use.    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  Other Topics Concern   Not on file  Social History Narrative   Not on file  Social Determinants of Health   Financial Resource Strain: Not on file  Food Insecurity: Not on file  Transportation Needs: Not on file  Physical Activity: Not on file  Stress: Not on file  Social Connections: Not on file  Intimate Partner Violence: Not on file     Review of Systems  Neurological:  Positive for headaches.  All other systems reviewed and are negative.     Objective:   Physical Exam Vitals reviewed.  Constitutional:      General: She is not in acute distress.    Appearance: Normal appearance. She is well-developed and normal weight. She is not ill-appearing or toxic-appearing.  HENT:     Head: Normocephalic and atraumatic.     Right Ear: Tympanic membrane, ear canal and external ear normal.     Left Ear: Tympanic membrane, ear canal and external ear  normal.     Nose:     Right Sinus: Maxillary sinus tenderness and frontal sinus tenderness present.     Mouth/Throat:     Pharynx: No oropharyngeal exudate.  Eyes:     General: Allergic shiner present. No visual field deficit. Cardiovascular:     Rate and Rhythm: Normal rate and regular rhythm.     Heart sounds: Normal heart sounds. No murmur heard.   No friction rub. No gallop.  Pulmonary:     Effort: Pulmonary effort is normal. No respiratory distress.     Breath sounds: No wheezing, rhonchi or rales.  Chest:     Chest wall: No tenderness.  Abdominal:     Tenderness: There is no rebound.  Neurological:     Mental Status: She is alert.     Cranial Nerves: No cranial nerve deficit, dysarthria or facial asymmetry.     Motor: No weakness.     Coordination: Coordination normal.     Gait: Gait normal.       Assessment & Plan:  Post-concussion headache We discussed strategies including preventative medication.  I explained that we could try either gabapentin or Topamax off label with a preventative strategy.  We could also treat the headache as needed.  For the time being the patient elects to continue using ibuprofen in the mornings as needed for headache.  I believe that she is taking this 3 times a week, this is her best option as I believe the gabapentin and Topamax would likely have more side effects.  If the headaches are worsening that would be the next step for her

## 2021-03-14 ENCOUNTER — Telehealth: Payer: Self-pay

## 2021-03-14 ENCOUNTER — Other Ambulatory Visit: Payer: Self-pay | Admitting: Family Medicine

## 2021-03-14 MED ORDER — IBUPROFEN 800 MG PO TABS: 800.0000 mg | ORAL_TABLET | Freq: Three times a day (TID) | ORAL | 2 refills | Status: DC | PRN

## 2021-03-14 NOTE — Telephone Encounter (Signed)
Patient called she stated someone called her about mychart, patient is requesting a call back:[phone]

## 2021-03-14 NOTE — Telephone Encounter (Signed)
Called and [facility] @ 3:16pm) asking the patient to return regarding the message.//AB/CMA

## 2021-03-15 ENCOUNTER — Encounter: Payer: Self-pay | Admitting: *Deleted

## 2021-03-15 ENCOUNTER — Telehealth: Payer: Self-pay

## 2021-03-15 NOTE — Telephone Encounter (Signed)
Sent message to patient by way of MyChart.//AB/CMA

## 2021-03-15 NOTE — Telephone Encounter (Signed)
Tried to return patient's call but was unable to leave a voice mail.//AB/CMA

## 2021-03-15 NOTE — Telephone Encounter (Signed)
Patient called she lvm, she stated she is returning you phone call. Call [phone]

## 2021-03-15 NOTE — Telephone Encounter (Signed)
Unable to reach patient by phone.  Sent patient message by way of MyChart.//AB/CMA

## 2021-03-15 NOTE — Telephone Encounter (Signed)
MyChart message sent to the patient on (03/15/21).//AB/CMA

## 2021-03-23 DIAGNOSIS — Z20822 Contact with and (suspected) exposure to covid-19: Secondary | ICD-10-CM | POA: Diagnosis not present

## 2021-03-26 DIAGNOSIS — Z20822 Contact with and (suspected) exposure to covid-19: Secondary | ICD-10-CM | POA: Diagnosis not present

## 2021-04-08 DIAGNOSIS — L438 Other lichen planus: Secondary | ICD-10-CM | POA: Diagnosis not present

## 2021-04-08 DIAGNOSIS — L918 Other hypertrophic disorders of the skin: Secondary | ICD-10-CM | POA: Diagnosis not present

## 2021-04-08 DIAGNOSIS — D225 Melanocytic nevi of trunk: Secondary | ICD-10-CM | POA: Diagnosis not present

## 2021-04-08 DIAGNOSIS — L821 Other seborrheic keratosis: Secondary | ICD-10-CM | POA: Diagnosis not present

## 2021-04-08 DIAGNOSIS — D2262 Melanocytic nevi of left upper limb, including shoulder: Secondary | ICD-10-CM | POA: Diagnosis not present

## 2021-04-08 DIAGNOSIS — D2271 Melanocytic nevi of right lower limb, including hip: Secondary | ICD-10-CM | POA: Diagnosis not present

## 2021-04-08 DIAGNOSIS — D1801 Hemangioma of skin and subcutaneous tissue: Secondary | ICD-10-CM | POA: Diagnosis not present

## 2021-04-08 DIAGNOSIS — D2272 Melanocytic nevi of left lower limb, including hip: Secondary | ICD-10-CM | POA: Diagnosis not present

## 2021-04-08 DIAGNOSIS — L718 Other rosacea: Secondary | ICD-10-CM | POA: Diagnosis not present

## 2021-04-08 DIAGNOSIS — L820 Inflamed seborrheic keratosis: Secondary | ICD-10-CM | POA: Diagnosis not present

## 2021-04-19 DIAGNOSIS — Z20822 Contact with and (suspected) exposure to covid-19: Secondary | ICD-10-CM | POA: Diagnosis not present

## 2021-04-29 ENCOUNTER — Telehealth: Payer: Self-pay | Admitting: *Deleted

## 2021-04-29 NOTE — Telephone Encounter (Signed)
ADJ TISS XFER LID,NOS,EAR <10 SQCM- RECMPL WND LID,NOS,EAR 1.1-2.5 CM- Outpatient ?[facility] ?Date: 04/29/2021 ?[place] telephone ?CPT 14060/ 13151 Dx: L90.15, [id number], H02.834 ?No Auth/ Referral Required  ?Representative: Chrys Racer ?[employer], LPN/ RSA  ?

## 2021-05-02 ENCOUNTER — Telehealth: Payer: Self-pay

## 2021-05-02 DIAGNOSIS — Z20822 Contact with and (suspected) exposure to covid-19: Secondary | ICD-10-CM | POA: Diagnosis not present

## 2021-05-02 NOTE — Telephone Encounter (Signed)
Called patient inquired if she was ready to schedule upper lid bleph and tissue rearrangement of left upper lid/brow. She currently has a bad case of ocular rosacea. She is still interested, but will wait until her eye cleared up. Will follow up with her in a month or so. ?

## 2021-05-16 DIAGNOSIS — Z20828 Contact with and (suspected) exposure to other viral communicable diseases: Secondary | ICD-10-CM | POA: Diagnosis not present

## 2021-05-21 DIAGNOSIS — Z20822 Contact with and (suspected) exposure to covid-19: Secondary | ICD-10-CM | POA: Diagnosis not present

## 2021-05-27 DIAGNOSIS — Z20822 Contact with and (suspected) exposure to covid-19: Secondary | ICD-10-CM | POA: Diagnosis not present

## 2021-05-28 ENCOUNTER — Ambulatory Visit (INDEPENDENT_AMBULATORY_CARE_PROVIDER_SITE_OTHER): Payer: Medicare Other | Admitting: Family Medicine

## 2021-05-28 VITALS — BP 118/80 | HR 87 | Temp 97.8°F | Ht 64.0 in | Wt 133.0 lb

## 2021-05-28 DIAGNOSIS — H6983 Other specified disorders of Eustachian tube, bilateral: Secondary | ICD-10-CM

## 2021-05-28 MED ORDER — AZITHROMYCIN 250 MG PO TABS: ORAL_TABLET | ORAL | 0 refills | Status: DC

## 2021-05-28 MED ORDER — MONTELUKAST SODIUM 10 MG PO TABS: 10.0000 mg | ORAL_TABLET | Freq: Every day | ORAL | 11 refills | Status: DC

## 2021-05-28 MED ORDER — FLUTICASONE PROPIONATE 50 MCG/ACT NA SUSP: 2.0000 | Freq: Every day | NASAL | 6 refills | Status: DC

## 2021-05-28 NOTE — Progress Notes (Signed)
? ?Subjective:  ? ? Patient ID: Brittany House, female    DOB: [birth year], 71 y.o.   MRN: [id number] ? ?Patient reports chronic sinus congestion.  She takes Claritin daily for this.  However over the last week, she reports "stuffiness in her ears".  She states that everything feels muffled.  She has difficulty hearing.  She denies any ear pain.  She denies any sinus pain but she does have sinus pressure.  She has head congestion and postnasal drip and rhinorrhea but no sore throat or fever or chills ? ?Past Medical History:  ?Diagnosis Date  ? Cancer [facility])   ? breast  ? GERD (gastroesophageal reflux disease)   ? Hyperlipidemia   ? Osteopenia   ? Osteoporosis   ? ?Past Surgical History:  ?Procedure Laterality Date  ? BREAST SURGERY N/A   ? [employer] 09/18/2019  ? c-sections x 3    ? HERNIA REPAIR    ? MASTECTOMY    ? ?Current Outpatient Medications on File Prior to Visit  ?Medication Sig Dispense Refill  ? CALCIUM 600 1500 (600 Ca) MG TABS tablet SMARTSIG:1 By Mouth    ? D 1000 25 MCG (1000 UT) capsule SMARTSIG:1 By Mouth    ? doxycycline (VIBRAMYCIN) 100 MG capsule Take 100 mg by mouth 2 (two) times daily.    ? ibuprofen (ADVIL) 800 MG tablet Take 1 tablet (800 mg total) by mouth every 8 (eight) hours as needed. 60 tablet 2  ? loratadine (CLARITIN) 10 MG tablet Take 1 tablet (10 mg total) by mouth daily. 30 tablet 11  ? Multiple Vitamin (MULTIVITAMIN) tablet Take 1 tablet by mouth daily.    ? ?No current facility-administered medications on file prior to visit.  ? ?Allergies  ?Allergen Reactions  ? Penicillins Swelling and Rash  ?  Has patient had a PCN reaction causing immediate rash, facial/tongue/throat swelling, SOB or lightheadedness with hypotension: Yes ?Has patient had a PCN reaction causing severe rash involving mucus membranes or skin necrosis: Yes ?Has patient had a PCN reaction that required hospitalization: No ?Has patient had a PCN reaction occurring within the last 10 years: No ?If all of the above  answers are "NO", then may proceed with Cephalosporin use. ?  ? ?Social History  ? ?Socioeconomic History  ? Marital status: Married  ?  Spouse name: Not on file  ? Number of children: Not on file  ? Years of education: Not on file  ? Highest education level: Not on file  ?Occupational History  ? Not on file  ?Tobacco Use  ? Smoking status: Never  ? Smokeless tobacco: Never  ?Substance and Sexual Activity  ? Alcohol use: Yes  ?  Comment: Rare  ? Drug use: No  ? Sexual activity: Yes  ?  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  ?Other Topics Concern  ? Not on file  ?Social History Narrative  ? Not on file  ? ?Social Determinants of Health  ? ?Financial Resource Strain: Not on file  ?Food Insecurity: Not on file  ?Transportation Needs: Not on file  ?Physical Activity: Not on file  ?Stress: Not on file  ?Social Connections: Not on file  ?Intimate Partner Violence: Not on file  ? ? ? ?Review of Systems  ?Neurological:  Positive for headaches.  ?All other systems reviewed and are negative. ? ?   ?Objective:  ? Physical Exam ?Vitals reviewed.  ?Constitutional:   ?   General: She is not in acute distress. ?  Appearance: Normal appearance. She is well-developed and normal weight. She is not ill-appearing or toxic-appearing.  ?HENT:  ?   Head: Normocephalic and atraumatic.  ?   Right Ear: Ear canal and external ear normal. Tympanic membrane is bulging. Tympanic membrane is not injected, scarred, perforated, erythematous or retracted.  ?   Left Ear: Ear canal and external ear normal. Tympanic membrane is bulging. Tympanic membrane is not injected, scarred, perforated, erythematous or retracted.  ?   Nose: Congestion and rhinorrhea present.  ?   Right Turbinates: Swollen.  ?   Left Turbinates: Swollen.  ?   Right Sinus: No maxillary sinus tenderness or frontal sinus tenderness.  ?   Left Sinus: No maxillary sinus tenderness or frontal sinus tenderness.  ?   Mouth/Throat:  ?   Pharynx: No oropharyngeal exudate.   ?Cardiovascular:  ?   Rate and Rhythm: Normal rate and regular rhythm.  ?   Heart sounds: Normal heart sounds. No murmur heard. ?  No friction rub. No gallop.  ?Pulmonary:  ?   Effort: Pulmonary effort is normal. No respiratory distress.  ?   Breath sounds: No wheezing, rhonchi or rales.  ?Chest:  ?   Chest wall: No tenderness.  ?Abdominal:  ?   Tenderness: There is no rebound.  ?Neurological:  ?   Mental Status: She is alert.  ?   Cranial Nerves: No cranial nerve deficit, dysarthria or facial asymmetry.  ?   Motor: No weakness.  ?   Coordination: Coordination normal.  ?   Gait: Gait normal.  ? ? ?   ?Assessment & Plan:  ?Dysfunction of both eustachian tubes ?I believe the patient has eustachian tube dysfunction due to sinusitis.  Recommend that she continue Claritin.  However I want her to use Afrin twice daily.  15 minutes after using Afrin, she can take Flonase 2 sprays each nostril.  I am using the Afrin to get down the swelling so that hopefully the Flonase can penetrate deeper into the nasopharynx.  Also want her to add Singulair given her chronic sinusitis secondary to allergies.  Hopefully if we can calm the sinusitis and eustachian tube dysfunction will resolve.  If not the neck step will be prednisone.  I see no indication for antibiotics at the present time.  I will give the patient a prescription for a Z-Pak.  If she starts to experience severe pain in her sinuses or fevers I want her to add this as well ?

## 2021-05-30 ENCOUNTER — Ambulatory Visit: Payer: Medicare Other | Admitting: Family Medicine

## 2021-05-31 ENCOUNTER — Telehealth: Payer: Self-pay

## 2021-05-31 DIAGNOSIS — Z20822 Contact with and (suspected) exposure to covid-19: Secondary | ICD-10-CM | POA: Diagnosis not present

## 2021-05-31 NOTE — Telephone Encounter (Signed)
Called patient to follow up if her ocular rosacea is cleared up. At this times, it is not quite healed completely and would like to give a some more time.  She will call when she is ready. ?

## 2021-06-03 DIAGNOSIS — Z20822 Contact with and (suspected) exposure to covid-19: Secondary | ICD-10-CM | POA: Diagnosis not present

## 2021-06-04 DIAGNOSIS — Z20822 Contact with and (suspected) exposure to covid-19: Secondary | ICD-10-CM | POA: Diagnosis not present

## 2021-06-04 DIAGNOSIS — R059 Cough, unspecified: Secondary | ICD-10-CM | POA: Diagnosis not present

## 2021-06-04 DIAGNOSIS — R051 Acute cough: Secondary | ICD-10-CM | POA: Diagnosis not present

## 2021-06-05 ENCOUNTER — Telehealth: Payer: Self-pay

## 2021-06-05 DIAGNOSIS — Z20822 Contact with and (suspected) exposure to covid-19: Secondary | ICD-10-CM | POA: Diagnosis not present

## 2021-06-05 NOTE — Telephone Encounter (Signed)
Spoke with pt, per 05/28/21 visit, pt stated that she is still have discomfort on the l-side of her ear. Would like to know if you still want to start her on Prednisone or something else? ? ?Please advice ?

## 2021-06-06 ENCOUNTER — Other Ambulatory Visit: Payer: Self-pay | Admitting: Family Medicine

## 2021-06-06 MED ORDER — PREDNISONE 20 MG PO TABS: ORAL_TABLET | ORAL | 0 refills | Status: DC

## 2021-06-08 DIAGNOSIS — Z20822 Contact with and (suspected) exposure to covid-19: Secondary | ICD-10-CM | POA: Diagnosis not present

## 2021-07-16 ENCOUNTER — Other Ambulatory Visit: Payer: Self-pay | Admitting: Family Medicine

## 2021-07-17 ENCOUNTER — Other Ambulatory Visit: Payer: Self-pay

## 2021-07-17 ENCOUNTER — Emergency Department (HOSPITAL_COMMUNITY)
Admission: EM | Admit: 2021-07-17 | Discharge: 2021-07-17 | Disposition: A | Discharge status: Home / Self Care | Payer: Medicare Other | Attending: Emergency Medicine | Admitting: Emergency Medicine

## 2021-07-17 ENCOUNTER — Encounter (HOSPITAL_COMMUNITY): Payer: Self-pay

## 2021-07-17 ENCOUNTER — Emergency Department (HOSPITAL_COMMUNITY): Payer: Medicare Other

## 2021-07-17 DIAGNOSIS — E878 Other disorders of electrolyte and fluid balance, not elsewhere classified: Secondary | ICD-10-CM | POA: Diagnosis not present

## 2021-07-17 DIAGNOSIS — R7989 Other specified abnormal findings of blood chemistry: Secondary | ICD-10-CM | POA: Insufficient documentation

## 2021-07-17 DIAGNOSIS — N201 Calculus of ureter: Secondary | ICD-10-CM | POA: Diagnosis not present

## 2021-07-17 DIAGNOSIS — N132 Hydronephrosis with renal and ureteral calculous obstruction: Secondary | ICD-10-CM | POA: Insufficient documentation

## 2021-07-17 DIAGNOSIS — K7689 Other specified diseases of liver: Secondary | ICD-10-CM | POA: Diagnosis not present

## 2021-07-17 DIAGNOSIS — K5730 Diverticulosis of large intestine without perforation or abscess without bleeding: Secondary | ICD-10-CM | POA: Diagnosis not present

## 2021-07-17 DIAGNOSIS — R6883 Chills (without fever): Secondary | ICD-10-CM | POA: Diagnosis not present

## 2021-07-17 DIAGNOSIS — R1011 Right upper quadrant pain: Secondary | ICD-10-CM | POA: Diagnosis present

## 2021-07-17 LAB — CBC
HCT: 41.7 % (ref 36.0–46.0)
Hemoglobin: 14 g/dL (ref 12.0–15.0)
MCH: 32 pg (ref 26.0–34.0)
MCHC: 33.6 g/dL (ref 30.0–36.0)
MCV: 95.4 fL (ref 80.0–100.0)
Platelets: 233 10*3/uL (ref 150–400)
RBC: 4.37 MIL/uL (ref 3.87–5.11)
RDW: 12.3 % (ref 11.5–15.5)
WBC: 8.4 10*3/uL (ref 4.0–10.5)
nRBC: 0 % (ref 0.0–0.2)

## 2021-07-17 LAB — BASIC METABOLIC PANEL
Anion gap: 10 (ref 5–15)
BUN: 24 mg/dL — ABNORMAL HIGH (ref 8–23)
CO2: 21 mmol/L — ABNORMAL LOW (ref 22–32)
Calcium: 9.2 mg/dL (ref 8.9–10.3)
Chloride: 106 mmol/L (ref 98–111)
Creatinine, Ser: 1.13 mg/dL — ABNORMAL HIGH (ref 0.44–1.00)
GFR, Estimated: 53 mL/min — ABNORMAL LOW (ref >60)
Glucose, Bld: 121 mg/dL — ABNORMAL HIGH (ref 70–99)
Potassium: 3.7 mmol/L (ref 3.5–5.1)
Sodium: 137 mmol/L (ref 135–145)

## 2021-07-17 LAB — URINALYSIS, ROUTINE W REFLEX MICROSCOPIC
Bacteria, UA: NONE SEEN
Bilirubin Urine: NEGATIVE
Glucose, UA: NEGATIVE mg/dL
Hgb urine dipstick: NEGATIVE
Ketones, ur: 20 mg/dL — AB
Nitrite: NEGATIVE
Protein, ur: NEGATIVE mg/dL
Specific Gravity, Urine: 1.014 (ref 1.005–1.030)
pH: 5 (ref 5.0–8.0)

## 2021-07-17 MED ORDER — KETOROLAC TROMETHAMINE 30 MG/ML IJ SOLN
30.0000 mg | Freq: Once | INTRAMUSCULAR | Status: AC
  Administered 2021-07-17: 30 mg via INTRAVENOUS
  Filled 2021-07-17: qty 1

## 2021-07-17 MED ORDER — ONDANSETRON HCL 4 MG PO TABS: 4.0000 mg | ORAL_TABLET | Freq: Three times a day (TID) | ORAL | 0 refills | Status: DC | PRN

## 2021-07-17 MED ORDER — MORPHINE SULFATE (PF) 4 MG/ML IV SOLN
4.0000 mg | Freq: Once | INTRAVENOUS | Status: AC
  Administered 2021-07-17: 4 mg via INTRAVENOUS
  Filled 2021-07-17: qty 1

## 2021-07-17 MED ORDER — TAMSULOSIN HCL 0.4 MG PO CAPS: 0.4000 mg | ORAL_CAPSULE | Freq: Every day | ORAL | 0 refills | Status: DC

## 2021-07-17 MED ORDER — ONDANSETRON HCL 4 MG/2ML IJ SOLN
4.0000 mg | Freq: Once | INTRAMUSCULAR | Status: AC
  Administered 2021-07-17: 4 mg via INTRAVENOUS
  Filled 2021-07-17: qty 2

## 2021-07-17 MED ORDER — OXYCODONE-ACETAMINOPHEN 5-325 MG PO TABS: 1.0000 | ORAL_TABLET | Freq: Four times a day (QID) | ORAL | 0 refills | Status: DC | PRN

## 2021-07-17 MED ORDER — SODIUM CHLORIDE 0.9 % IV BOLUS
1000.0000 mL | Freq: Once | INTRAVENOUS | Status: AC
  Administered 2021-07-17: 1000 mL via INTRAVENOUS

## 2021-07-17 NOTE — ED Triage Notes (Addendum)
Pt presents to ED from home with c/o right sided flank pain, oliguria, with N/V x 4 days. Pt states last night she developed severe back pain and chills. Hx Kidney Stone

## 2021-07-17 NOTE — ED Provider Notes (Signed)
[facility] DEPT Provider Note   CSN: [id number] Arrival date & time: 07/17/21  0608     History  Chief Complaint  Patient presents with   Flank Pain    Brittany House is a 71 y.o. female.  She is here with a complaint of right upper quadrant pain radiating down the right groin associated with nausea vomiting going on for the last 4 days intermittently.  Been more consistent since last night radiating into her right flank.  Decreased urine output.  Chills no clear fever.  Has had kidney stones before and feels similar.  Drinking lots of fluids without improvement.  Rates the pain as severe.  The history is provided by the patient.  Flank Pain This is a recurrent problem. The current episode started more than 2 days ago. The problem occurs daily. The problem has been gradually worsening. Associated symptoms include abdominal pain. Pertinent negatives include no chest pain, no headaches and no shortness of breath. Nothing aggravates the symptoms. Nothing relieves the symptoms. She has tried rest and water for the symptoms. The treatment provided no relief.       Home Medications Prior to Admission medications   Medication Sig Start Date End Date Taking? Authorizing Provider  predniSONE (DELTASONE) 20 MG tablet 3 tabs poqday 1-2, 2 tabs poqday 3-4, 1 tab poqday 5-6 06/06/21   Susy Frizzle, MD  azithromycin (ZITHROMAX) 250 MG tablet 2 tabs poqday1, 1 tab poqday 2-5 05/28/21   Susy Frizzle, MD  CALCIUM 600 1500 (600 Ca) MG TABS tablet SMARTSIG:1 By Mouth 10/22/20   [provider]  D 1000 25 MCG (1000 UT) capsule SMARTSIG:1 By Mouth 10/22/20   [provider]  doxycycline (VIBRAMYCIN) 100 MG capsule Take 100 mg by mouth 2 (two) times daily. Patient not taking: Reported on 05/28/2021 03/11/21   [provider]  fluticasone (FLONASE) 50 MCG/ACT nasal spray Place 2 sprays into both nostrils daily. 05/28/21   Susy Frizzle, MD   ibuprofen (ADVIL) 800 MG tablet Take 1 tablet (800 mg total) by mouth every 8 (eight) hours as needed. 03/14/21   Susy Frizzle, MD  loratadine (CLARITIN) 10 MG tablet Take 1 tablet (10 mg total) by mouth daily. 11/20/20   Susy Frizzle, MD  montelukast (SINGULAIR) 10 MG tablet Take 1 tablet (10 mg total) by mouth at bedtime. 05/28/21   Susy Frizzle, MD  Multiple Vitamin (MULTIVITAMIN) tablet Take 1 tablet by mouth daily.    [provider]      Allergies    Penicillins    Review of Systems   Review of Systems  Constitutional:  Positive for chills. Negative for fever.  HENT:  Negative for sore throat.   Respiratory:  Negative for shortness of breath.   Cardiovascular:  Negative for chest pain.  Gastrointestinal:  Positive for abdominal pain, nausea and vomiting.  Genitourinary:  Positive for flank pain. Negative for dysuria.  Musculoskeletal:  Positive for back pain.  Skin:  Negative for rash.  Neurological:  Negative for headaches.    Physical Exam Updated Vital Signs BP (!) 154/85 (BP Location: Right Arm)   Pulse 80   Temp 98.4 F (36.9 C) (Oral)   Resp 16   Ht '5\' 4"'$  (1.626 m)   Wt 60.8 kg   SpO2 95%   BMI 23.00 kg/m  Physical Exam Vitals and nursing note reviewed.  Constitutional:      General: She is not in acute distress.  Appearance: Normal appearance. She is well-developed.  HENT:     Head: Normocephalic and atraumatic.  Eyes:     Conjunctiva/sclera: Conjunctivae normal.  Cardiovascular:     Rate and Rhythm: Normal rate and regular rhythm.     Heart sounds: No murmur heard. Pulmonary:     Effort: Pulmonary effort is normal. No respiratory distress.     Breath sounds: Normal breath sounds.  Abdominal:     Palpations: Abdomen is soft.     Tenderness: There is no abdominal tenderness. There is no guarding or rebound.  Musculoskeletal:     Cervical back: Neck supple.     Right lower leg: No edema.     Left lower leg: No edema.  Skin:     General: Skin is warm and dry.     Capillary Refill: Capillary refill takes less than 2 seconds.  Neurological:     General: No focal deficit present.     Mental Status: She is alert.  Psychiatric:        Mood and Affect: Mood normal.     ED Results / Procedures / Treatments   Labs (all labs ordered are listed, but only abnormal results are displayed) Labs Reviewed  BASIC METABOLIC PANEL - Abnormal; Notable for the following components:      Result Value   CO2 21 (*)    Glucose, Bld 121 (*)    BUN 24 (*)    Creatinine, Ser 1.13 (*)    GFR, Estimated 53 (*)    All other components within normal limits  URINALYSIS, ROUTINE W REFLEX MICROSCOPIC - Abnormal; Notable for the following components:   Ketones, ur 20 (*)    Leukocytes,Ua TRACE (*)    All other components within normal limits  CBC    EKG None  Radiology CT RENAL STONE STUDY  Result Date: 07/17/2021 CLINICAL DATA:  71 year old female with history of right-sided flank pain. Back pain and chills for 1 days. EXAM: CT ABDOMEN AND PELVIS WITHOUT CONTRAST TECHNIQUE: Multidetector CT imaging of the abdomen and pelvis was performed following the standard protocol without IV contrast. RADIATION DOSE REDUCTION: This exam was performed according to the departmental dose-optimization program which includes automated exposure control, adjustment of the mA and/or kV according to patient size and/or use of iterative reconstruction technique. COMPARISON:  CT the abdomen and pelvis 10/12/2019. FINDINGS: Lower chest: Left-sided breast implant incidentally noted. Calcifications of the mitral annulus. Hepatobiliary: No definite suspicious cystic or solid hepatic lesions are confidently identified on today's noncontrast CT examination. Liver has a slightly nodular contour, indicative of underlying cirrhosis. Unenhanced appearance of the gallbladder is normal. Pancreas: Several coarse calcifications are scattered throughout the pancreas, likely  sequela of chronic pancreatitis. No definite pancreatic mass or peripancreatic fluid collections or inflammatory changes are noted on today's noncontrast CT examination. Spleen: Unremarkable. Adrenals/Urinary Tract: Axial image 77 of series 2 demonstrates a 3 mm calculus at or immediately beyond the right ureterovesicular junction. This is associated with mild proximal right hydroureteronephrosis and perinephric soft tissue stranding. Multiple other small nonobstructive calculi are noted within the collecting systems of both kidneys measuring up to 3 mm in the interpolar region of the right kidney. No calculi are noted along the course of the left ureter. Low-attenuation lesions in the renal hila bilaterally, similar to the prior study, previously characterized as parapelvic cysts. Bilateral adrenal glands are unremarkable in appearance. Urinary bladder is nearly decompressed, but otherwise unremarkable in appearance. Stomach/Bowel: Unenhanced appearance of the stomach is normal.  Duodenal diverticulum extending off the anteromedial aspect of the second portion of the duodenum, without definite focal surrounding inflammatory changes to indicate an associated diverticulitis at this time. No pathologic dilatation of small bowel or colon. Numerous colonic diverticulae are noted, particularly in the descending colon and sigmoid colon, without surrounding inflammatory changes to suggest an acute diverticulitis at this time. Normal appendix. Vascular/Lymphatic: Aortic atherosclerosis. No lymphadenopathy noted in the abdomen or pelvis. Reproductive: Unenhanced appearance of the uterus and ovaries is unremarkable. Other: No significant volume of ascites.  No pneumoperitoneum. Musculoskeletal: There are no aggressive appearing lytic or blastic lesions noted in the visualized portions of the skeleton. IMPRESSION: 1. 3 mm calculus at or immediately beyond the right ureterovesicular junction with mild proximal right  hydroureteronephrosis and perinephric soft tissue stranding indicating obstruction. 2. Additional nonobstructive calculi are present within the collecting systems of both kidneys measuring up to 3 mm in the interpolar collecting system of the right kidney. 3. Colonic diverticulosis without evidence of acute diverticulitis at this time. 4. Aortic atherosclerosis. 5. Additional incidental findings, as above. Electronically Signed   By: Vinnie Langton M.D.   On: 07/17/2021 07:45    Procedures Procedures    Medications Ordered in ED Medications  sodium chloride 0.9 % bolus 1,000 mL (has no administration in time range)  ondansetron (ZOFRAN) injection 4 mg (has no administration in time range)  morphine (PF) 4 MG/ML injection 4 mg (has no administration in time range)  ketorolac (TORADOL) 30 MG/ML injection 30 mg (has no administration in time range)    ED Course/ Medical Decision Making/ A&P Clinical Course as of 07/17/21 1636  Wed Jul 17, 2021  1026 Patient states her pain is much improved after pain medication.  Awaiting results of urine sample. [MB]    Clinical Course User Index [MB] Hayden Rasmussen, MD                           Medical Decision Making Amount and/or Complexity of Data Reviewed Labs: ordered. Radiology: ordered.  Risk Prescription drug management.  This patient complains of right-sided abdominal pain nausea vomiting; this involves an extensive number of treatment Options and is a complaint that carries with it a high risk of complications and morbidity. The differential includes renal colic, pyelonephritis, colitis, obstruction, appendicitis,  diverticulitis  I ordered, reviewed and interpreted labs, which included CBC with normal white count normal hemoglobin, chemistries with mildly low bicarb elevated BUN and creatinine, urinalysis without signs of infection I ordered medication IV fluids IV pain medication with improvement in her symptoms and reviewed PMP  when indicated. I ordered imaging studies which included CT renal and I independently    visualized and interpreted imaging which showed 4 mm distal stone on right Additional history obtained from patient significant other Previous records obtained and reviewed in epic no recent admissions Cardiac monitoring reviewed, normal sinus rhythm Social determinants considered, no significant barriers Critical Interventions: None  After the interventions stated above, I reevaluated the patient and found patient's pain to be adequately controlled Admission and further testing considered, no indications for admission at this time.  Anticipated clinical course reviewed with patient and return instructions discussed.          Final Clinical Impression(s) / ED Diagnoses Final diagnoses:  Ureterolithiasis    Rx / DC Orders ED Discharge Orders          Ordered    tamsulosin (FLOMAX) 0.4 MG CAPS capsule  Daily        07/17/21 1043    ondansetron (ZOFRAN) 4 MG tablet  Every 8 hours PRN        07/17/21 1043    oxyCODONE-acetaminophen (PERCOCET/ROXICET) 5-325 MG tablet  Every 6 hours PRN        07/17/21 1043              Hayden Rasmussen, MD 07/17/21 [phone]

## 2021-07-17 NOTE — Discharge Instructions (Signed)
You were seen in the emergency department for right-sided abdominal pain.  You have a 4 mm kidney stone.  This may pass on its own but will still be important for you to schedule an appointment with [facility].  He is drink plenty of fluids.  We are providing prescriptions for nausea and pain medications along with tamsulosin.  Return to the emergency department if uncontrolled pain or any high fevers.

## 2021-07-18 DIAGNOSIS — N202 Calculus of kidney with calculus of ureter: Secondary | ICD-10-CM | POA: Diagnosis not present

## 2021-07-22 ENCOUNTER — Ambulatory Visit (INDEPENDENT_AMBULATORY_CARE_PROVIDER_SITE_OTHER): Payer: Medicare Other | Admitting: Family Medicine

## 2021-07-22 VITALS — BP 118/70 | HR 78 | Temp 98.1°F | Ht 64.0 in | Wt 131.0 lb

## 2021-07-22 DIAGNOSIS — R011 Cardiac murmur, unspecified: Secondary | ICD-10-CM | POA: Diagnosis not present

## 2021-07-22 DIAGNOSIS — I3481 Nonrheumatic mitral (valve) annulus calcification: Secondary | ICD-10-CM | POA: Diagnosis not present

## 2021-07-22 DIAGNOSIS — I700 Atherosclerosis of aorta: Secondary | ICD-10-CM | POA: Diagnosis not present

## 2021-07-22 MED ORDER — ROSUVASTATIN CALCIUM 10 MG PO TABS: 10.0000 mg | ORAL_TABLET | Freq: Every day | ORAL | 3 refills | Status: DC

## 2021-07-22 NOTE — Progress Notes (Signed)
Subjective:    Patient ID: Brittany House, female    DOB: 10-28-1950, 71 y.o.   MRN: [id number]  FINDINGS: Lower chest: Left-sided breast implant incidentally noted. Calcifications of the mitral annulus.   Hepatobiliary: No definite suspicious cystic or solid hepatic lesions are confidently identified on today's noncontrast CT examination. Liver has a slightly nodular contour, indicative of underlying cirrhosis. Unenhanced appearance of the gallbladder is normal.   Pancreas: Several coarse calcifications are scattered throughout the pancreas, likely sequela of chronic pancreatitis. No definite pancreatic mass or peripancreatic fluid collections or inflammatory changes are noted on today's noncontrast CT examination.   Spleen: Unremarkable.   Adrenals/Urinary Tract: Axial image 77 of series 2 demonstrates a 3 mm calculus at or immediately beyond the right ureterovesicular junction. This is associated with mild proximal right hydroureteronephrosis and perinephric soft tissue stranding. Multiple other small nonobstructive calculi are noted within the collecting systems of both kidneys measuring up to 3 mm in the interpolar region of the right kidney. No calculi are noted along the course of the left ureter. Low-attenuation lesions in the renal hila bilaterally, similar to the prior study, previously characterized as parapelvic cysts. Bilateral adrenal glands are unremarkable in appearance. Urinary bladder is nearly decompressed, but otherwise unremarkable in appearance.   Stomach/Bowel: Unenhanced appearance of the stomach is normal. Duodenal diverticulum extending off the anteromedial aspect of the second portion of the duodenum, without definite focal surrounding inflammatory changes to indicate an associated diverticulitis at this time. No pathologic dilatation of small bowel or colon. Numerous colonic diverticulae are noted, particularly in the descending colon and sigmoid  colon, without surrounding inflammatory changes to suggest an acute diverticulitis at this time. Normal appendix.   Vascular/Lymphatic: Aortic atherosclerosis. No lymphadenopathy noted in the abdomen or pelvis.   Reproductive: Unenhanced appearance of the uterus and ovaries is unremarkable.   Other: No significant volume of ascites.  No pneumoperitoneum.   Musculoskeletal: There are no aggressive appearing lytic or blastic lesions noted in the visualized portions of the skeleton.  Above, is the results of the CT scan the patient had on June 14.  She was diagnosed with a 3 mm kidney stone at the right ureterovesicular junction.  She has seen urology and is currently on tamsulosin and pain medication.  She is scheduled to follow-up with him in 3 weeks.  However she is concerned about the coincidental findings including mitral valve calcifications, aortic atherosclerosis, calcifications in the pancreas.  Patient denies any chest pain shortness of breath dyspnea on exertion angina.  She does have pain systolic murmur appreciated on exam.  She denies any syncope or near syncope.  She denies any claudication with ambulation in her legs. Past Medical History:  Diagnosis Date   Cancer [facility])    breast   GERD (gastroesophageal reflux disease)    Hyperlipidemia    Osteopenia    Osteoporosis    Past Surgical History:  Procedure Laterality Date   BREAST SURGERY N/A    [employer] 09/18/2019   c-sections x 3     HERNIA REPAIR     MASTECTOMY     Current Outpatient Medications on File Prior to Visit  Medication Sig Dispense Refill   CALCIUM 600 1500 (600 Ca) MG TABS tablet SMARTSIG:1 By Mouth     D 1000 25 MCG (1000 UT) capsule SMARTSIG:1 By Mouth     fluticasone (FLONASE) 50 MCG/ACT nasal spray Place 2 sprays into both nostrils daily. 16 g 6   ibuprofen (ADVIL) 800 MG tablet  Take 1 tablet (800 mg total) by mouth every 8 (eight) hours as needed. 60 tablet 2   loratadine (CLARITIN) 10 MG tablet  Take 1 tablet (10 mg total) by mouth daily. 30 tablet 11   montelukast (SINGULAIR) 10 MG tablet Take 1 tablet (10 mg total) by mouth at bedtime. 30 tablet 11   Multiple Vitamin (MULTIVITAMIN) tablet Take 1 tablet by mouth daily.     ondansetron (ZOFRAN) 4 MG tablet Take 1 tablet (4 mg total) by mouth every 8 (eight) hours as needed for nausea or vomiting. 15 tablet 0   oxyCODONE-acetaminophen (PERCOCET/ROXICET) 5-325 MG tablet Take 1 tablet by mouth every 6 (six) hours as needed for severe pain. 15 tablet 0   tamsulosin (FLOMAX) 0.4 MG CAPS capsule Take 1 capsule (0.4 mg total) by mouth daily. 30 capsule 0   azithromycin (ZITHROMAX) 250 MG tablet 2 tabs poqday1, 1 tab poqday 2-5 (Patient not taking: Reported on 07/22/2021) 6 tablet 0   doxycycline (VIBRAMYCIN) 100 MG capsule Take 100 mg by mouth 2 (two) times daily. (Patient not taking: Reported on 07/22/2021)     predniSONE (DELTASONE) 20 MG tablet 3 tabs poqday 1-2, 2 tabs poqday 3-4, 1 tab poqday 5-6 (Patient not taking: Reported on 07/22/2021) 12 tablet 0   No current facility-administered medications on file prior to visit.   Allergies  Allergen Reactions   Penicillins Swelling and Rash    Has patient had a PCN reaction causing immediate rash, facial/tongue/throat swelling, SOB or lightheadedness with hypotension: Yes Has patient had a PCN reaction causing severe rash involving mucus membranes or skin necrosis: Yes Has patient had a PCN reaction that required hospitalization: No Has patient had a PCN reaction occurring within the last 10 years: No If all of the above answers are "NO", then may proceed with Cephalosporin use.    Social History   Socioeconomic History   Marital status: Married    Spouse name: Not on file   Number of children: Not on file   Years of education: Not on file   Highest education level: Not on file  Occupational History   Not on file  Tobacco Use   Smoking status: Never   Smokeless tobacco: Never   Substance and Sexual Activity   Alcohol use: Yes    Comment: Rare   Drug use: No   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  Other Topics Concern   Not on file  Social History Narrative   Not on file   Social Determinants of Health   Financial Resource Strain: Not on file  Food Insecurity: Not on file  Transportation Needs: Not on file  Physical Activity: Not on file  Stress: Not on file  Social Connections: Not on file  Intimate Partner Violence: Not on file     Review of Systems  Neurological:  Positive for headaches.  All other systems reviewed and are negative.      Objective:   Physical Exam Vitals reviewed.  Constitutional:      General: She is not in acute distress.    Appearance: Normal appearance. She is well-developed and normal weight. She is not ill-appearing or toxic-appearing.  HENT:     Head: Normocephalic and atraumatic.     Nose:     Right Sinus: Maxillary sinus tenderness and frontal sinus tenderness present.  Cardiovascular:     Rate and Rhythm: Normal rate and regular rhythm.     Heart sounds: Murmur  heard.     No friction rub. No gallop.  Pulmonary:     Effort: Pulmonary effort is normal. No respiratory distress.     Breath sounds: No wheezing, rhonchi or rales.  Chest:     Chest wall: No tenderness.  Neurological:     Mental Status: She is alert.     Cranial Nerves: No cranial nerve deficit, dysarthria or facial asymmetry.     Motor: No weakness.     Coordination: Coordination normal.     Gait: Gait normal.        Assessment & Plan:  Mitral valve annular calcification - Plan: ECHOCARDIOGRAM COMPLETE  Murmur, cardiac - Plan: ECHOCARDIOGRAM COMPLETE  Aortic atherosclerosis ([place])  Patient is completely asymptomatic.  I will schedule an echocardiogram given her murmur to evaluate this further and determine the extent of any mitral valve disorder.  Her aortic atherosclerosis is asymptomatic.  However I  would like to try to get her LDL cholesterol below 100 so I will start her on Crestor 10 mg a day and recommended rechecking liver function test and cholesterol in 3 months.  Patient does not drink any alcohol.  There is no active liver inflammation on recent lab work.  I suspect that she may have had mild nonalcoholic steatohepatitis there is no history of chronic pancreatitis.

## 2021-08-09 DIAGNOSIS — N202 Calculus of kidney with calculus of ureter: Secondary | ICD-10-CM | POA: Diagnosis not present

## 2021-08-15 ENCOUNTER — Ambulatory Visit (HOSPITAL_COMMUNITY): Payer: Medicare Other | Attending: Internal Medicine

## 2021-08-15 DIAGNOSIS — I3481 Nonrheumatic mitral (valve) annulus calcification: Secondary | ICD-10-CM | POA: Insufficient documentation

## 2021-08-15 DIAGNOSIS — R011 Cardiac murmur, unspecified: Secondary | ICD-10-CM | POA: Diagnosis not present

## 2021-08-15 LAB — ECHOCARDIOGRAM COMPLETE
Area-P 1/2: 3.48 cm2
S' Lateral: 2.2 cm

## 2021-09-02 DIAGNOSIS — H10823 Rosacea conjunctivitis, bilateral: Secondary | ICD-10-CM | POA: Diagnosis not present

## 2021-09-02 DIAGNOSIS — H1132 Conjunctival hemorrhage, left eye: Secondary | ICD-10-CM | POA: Diagnosis not present

## 2021-09-02 DIAGNOSIS — H04123 Dry eye syndrome of bilateral lacrimal glands: Secondary | ICD-10-CM | POA: Diagnosis not present

## 2021-09-06 ENCOUNTER — Encounter: Payer: Self-pay | Admitting: Family Medicine

## 2021-10-08 ENCOUNTER — Encounter: Payer: Medicare Other | Admitting: Family Medicine

## 2021-11-01 ENCOUNTER — Other Ambulatory Visit: Payer: Medicare Other

## 2021-11-04 ENCOUNTER — Other Ambulatory Visit: Payer: Self-pay

## 2021-11-04 ENCOUNTER — Other Ambulatory Visit: Payer: Medicare Other

## 2021-11-04 DIAGNOSIS — E785 Hyperlipidemia, unspecified: Secondary | ICD-10-CM

## 2021-11-04 DIAGNOSIS — I3481 Nonrheumatic mitral (valve) annulus calcification: Secondary | ICD-10-CM

## 2021-11-04 DIAGNOSIS — M810 Age-related osteoporosis without current pathological fracture: Secondary | ICD-10-CM

## 2021-11-04 DIAGNOSIS — I700 Atherosclerosis of aorta: Secondary | ICD-10-CM

## 2021-11-06 DIAGNOSIS — Z83518 Family history of other specified eye disorder: Secondary | ICD-10-CM | POA: Diagnosis not present

## 2021-11-06 DIAGNOSIS — H2513 Age-related nuclear cataract, bilateral: Secondary | ICD-10-CM | POA: Diagnosis not present

## 2021-11-06 DIAGNOSIS — H10823 Rosacea conjunctivitis, bilateral: Secondary | ICD-10-CM | POA: Diagnosis not present

## 2021-11-06 DIAGNOSIS — H04123 Dry eye syndrome of bilateral lacrimal glands: Secondary | ICD-10-CM | POA: Diagnosis not present

## 2021-11-07 ENCOUNTER — Ambulatory Visit (INDEPENDENT_AMBULATORY_CARE_PROVIDER_SITE_OTHER): Payer: Medicare Other | Admitting: Family Medicine

## 2021-11-07 VITALS — BP 122/62 | HR 75 | Ht 64.0 in | Wt 131.0 lb

## 2021-11-07 DIAGNOSIS — Z0000 Encounter for general adult medical examination without abnormal findings: Secondary | ICD-10-CM

## 2021-11-07 DIAGNOSIS — I3481 Nonrheumatic mitral (valve) annulus calcification: Secondary | ICD-10-CM | POA: Diagnosis not present

## 2021-11-07 DIAGNOSIS — K760 Fatty (change of) liver, not elsewhere classified: Secondary | ICD-10-CM | POA: Diagnosis not present

## 2021-11-07 DIAGNOSIS — G44309 Post-traumatic headache, unspecified, not intractable: Secondary | ICD-10-CM | POA: Diagnosis not present

## 2021-11-07 DIAGNOSIS — M8580 Other specified disorders of bone density and structure, unspecified site: Secondary | ICD-10-CM

## 2021-11-07 DIAGNOSIS — I700 Atherosclerosis of aorta: Secondary | ICD-10-CM | POA: Diagnosis not present

## 2021-11-07 DIAGNOSIS — J329 Chronic sinusitis, unspecified: Secondary | ICD-10-CM | POA: Diagnosis not present

## 2021-11-07 MED ORDER — IBUPROFEN 800 MG PO TABS: 800.0000 mg | ORAL_TABLET | Freq: Three times a day (TID) | ORAL | 2 refills | Status: DC | PRN

## 2021-11-07 MED ORDER — TRIAMCINOLONE ACETONIDE 55 MCG/ACT NA AERO: 2.0000 | INHALATION_SPRAY | Freq: Every day | NASAL | 12 refills | Status: DC

## 2021-11-07 NOTE — Progress Notes (Signed)
Subjective:    Patient ID: Brittany House, female    DOB: 1950-08-31, 71 y.o.   MRN: [id number]  HPI Patient is a very sweet 71 year old Caucasian female here today for a complete physical exam.   Her colonoscopy was performed in 2019.  They recommended a repeat colonoscopy in 5 years due to a history of polyps.  Thankfully her biopsies of her polyps in 2019 revealed just hyperplastic polyps.  Patient suffered a facial fracture over a year ago when she was struck in the face with a foul ball at a baseball game.  She continues to have episodic headaches.  She states that she may go a month without a headache and then she will have several days where she will have a sharp stabbing pain on the left side of her head.  It usually goes away with ibuprofen.  She denies any photophobia or phonophobia.  She denies any blurry vision or nausea.  Her mammogram is due in November.  She is already scheduled this.  She is due for a flu shot.  She is due for a COVID booster.  She has had shingles vaccine.  We discussed RSV.  She also reports chronic sinusitis.  She reports postnasal drip head congestion and rhinorrhea despite taking Claritin Singulair and Flonase although she is no longer taking Flonase. Immunization History  Administered Date(s) Administered   Fluad Quad(high Dose 65+) 10/14/2018, 11/03/2019, 12/06/2020   Influenza Split 12/18/2011   Influenza,inj,Quad PF,6+ Mos 11/16/2012, 12/02/2013, 12/26/2014, 11/29/2015, 11/12/2016, 11/30/2017   Influenza-Unspecified 11/03/2016   PFIZER(Purple Top)SARS-COV-2 Vaccination 03/12/2019, 04/06/2019, 06/23/2020   Pneumococcal Conjugate-13 09/28/2017   Pneumococcal Polysaccharide-23 09/14/2018   Tdap 09/15/2011, 07/20/2020   Zoster Recombinat (Shingrix) 02/23/2018   Zoster, Live 07/26/2012   Lab on 11/04/2021  Component Date Value Ref Range Status   Cholesterol 11/04/2021 125  <200 mg/dL Final   HDL 11/04/2021 51  > OR = 50 mg/dL Final   Triglycerides  11/04/2021 148  <150 mg/dL Final   LDL Cholesterol (Calc) 11/04/2021 51  mg/dL (calc) Final   Comment: Reference range: <100 . Desirable range <100 mg/dL for primary prevention;   <70 mg/dL for patients with CHD or diabetic patients  with > or = 2 CHD risk factors. Marland Kitchen LDL-C is now calculated using the Martin-Hopkins  calculation, which is a validated novel method providing  better accuracy than the Friedewald equation in the  estimation of LDL-C.  Cresenciano Genre et al. Annamaria Helling. 5993;570(17): 2061-2068  (http://education.[facility].com/faq/FAQ164)    Total CHOL/HDL Ratio 11/04/2021 2.5  <5.0 (calc) Final   Non-HDL Cholesterol (Calc) 11/04/2021 74  <130 mg/dL (calc) Final   Comment: For patients with diabetes plus 1 major ASCVD risk  factor, treating to a non-HDL-C goal of <100 mg/dL  (LDL-C of <70 mg/dL) is considered a therapeutic  option.    Glucose, Bld 11/04/2021 87  65 - 99 mg/dL Final   Comment: .            Fasting reference interval .    BUN 11/04/2021 20  7 - 25 mg/dL Final   Creat 11/04/2021 0.79  0.50 - 1.05 mg/dL Final   BUN/Creatinine Ratio 11/04/2021 SEE NOTE:  6 - 22 (calc) Final   Comment:    Not Reported: BUN and Creatinine are within    reference range. .    Sodium 11/04/2021 142  135 - 146 mmol/L Final   Potassium 11/04/2021 4.3  3.5 - 5.3 mmol/L Final   Chloride 11/04/2021 108  98 -  110 mmol/L Final   CO2 11/04/2021 24  20 - 32 mmol/L Final   Calcium 11/04/2021 9.0  8.6 - 10.4 mg/dL Final   WBC 11/04/2021 4.9  3.8 - 10.8 Thousand/uL Final   RBC 11/04/2021 4.60  3.80 - 5.10 Million/uL Final   Hemoglobin 11/04/2021 14.5  11.7 - 15.5 g/dL Final   HCT 11/04/2021 42.7  35.0 - 45.0 % Final   MCV 11/04/2021 92.8  80.0 - 100.0 fL Final   MCH 11/04/2021 31.5  27.0 - 33.0 pg Final   MCHC 11/04/2021 34.0  32.0 - 36.0 g/dL Final   RDW 11/04/2021 11.8  11.0 - 15.0 % Final   Platelets 11/04/2021 206  140 - 400 Thousand/uL Final   MPV 11/04/2021 11.3  7.5 - 12.5 fL  Final   Neutro Abs 11/04/2021 2,509  1,500 - 7,800 cells/uL Final   Lymphs Abs 11/04/2021 1,774  850 - 3,900 cells/uL Final   Absolute Monocytes 11/04/2021 456  200 - 950 cells/uL Final   Eosinophils Absolute 11/04/2021 132  15 - 500 cells/uL Final   Basophils Absolute 11/04/2021 29  0 - 200 cells/uL Final   Neutrophils Relative % 11/04/2021 51.2  % Final   Total Lymphocyte 11/04/2021 36.2  % Final   Monocytes Relative 11/04/2021 9.3  % Final   Eosinophils Relative 11/04/2021 2.7  % Final   Basophils Relative 11/04/2021 0.6  % Final     Past Medical History:  Diagnosis Date   Cancer ([place])    breast   GERD (gastroesophageal reflux disease)    Hyperlipidemia    Osteopenia    Osteoporosis    Past Surgical History:  Procedure Laterality Date   BREAST SURGERY N/A    [employer] 09/18/2019   c-sections x 3     HERNIA REPAIR     MASTECTOMY     Current Outpatient Medications on File Prior to Visit  Medication Sig Dispense Refill   CALCIUM 600 1500 (600 Ca) MG TABS tablet SMARTSIG:1 By Mouth     D 1000 25 MCG (1000 UT) capsule SMARTSIG:1 By Mouth     doxycycline (VIBRAMYCIN) 100 MG capsule Take 100 mg by mouth 2 (two) times daily. (Patient not taking: Reported on 07/22/2021)     loratadine (CLARITIN) 10 MG tablet Take 1 tablet (10 mg total) by mouth daily. 30 tablet 11   montelukast (SINGULAIR) 10 MG tablet Take 1 tablet (10 mg total) by mouth at bedtime. 30 tablet 11   Multiple Vitamin (MULTIVITAMIN) tablet Take 1 tablet by mouth daily.     rosuvastatin (CRESTOR) 10 MG tablet Take 1 tablet (10 mg total) by mouth daily. 90 tablet 3   No current facility-administered medications on file prior to visit.   Allergies  Allergen Reactions   Penicillins Swelling and Rash    Has patient had a PCN reaction causing immediate rash, facial/tongue/throat swelling, SOB or lightheadedness with hypotension: Yes Has patient had a PCN reaction causing severe rash involving mucus membranes or skin  necrosis: Yes Has patient had a PCN reaction that required hospitalization: No Has patient had a PCN reaction occurring within the last 10 years: No If all of the above answers are "NO", then may proceed with Cephalosporin use.    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  Other Topics Concern   Not on file  Social History Narrative   Not on file   Social Determinants of Health   Financial Resource Strain: Not on file  Food Insecurity: Not on file  Transportation Needs: Not on file  Physical Activity: Not on file  Stress: Not on file  Social Connections: Not on file  Intimate Partner Violence: Not on file   Family History  Problem Relation Age of Onset   Heart disease Neg Hx    Cancer Neg Hx       Review of Systems     Objective:   Physical Exam Vitals reviewed.  Constitutional:      General: She is not in acute distress.    Appearance: Normal appearance. She is normal weight. She is not diaphoretic.  HENT:     Head: Normocephalic.     Right Ear: Tympanic membrane, ear canal and external ear normal.     Left Ear: Tympanic membrane, ear canal and external ear normal.     Nose: Mucosal edema and rhinorrhea present.     Right Nostril: Epistaxis present.     Left Nostril: Epistaxis present.     Left Sinus: Maxillary sinus tenderness and frontal sinus tenderness present.     Mouth/Throat:     Mouth: Mucous membranes are moist.     Pharynx: No oropharyngeal exudate or posterior oropharyngeal erythema.  Eyes:     General: No scleral icterus.        Right eye: No discharge.        Left eye: No discharge.     Conjunctiva/sclera: Conjunctivae normal.     Pupils: Pupils are equal, round, and reactive to light.  Neck:     Vascular: No carotid bruit.  Cardiovascular:     Rate and Rhythm: Normal rate and regular rhythm.     Pulses: Normal pulses.     Heart sounds: Normal heart sounds. No murmur heard.    No friction rub. No gallop.  Pulmonary:     Effort: Pulmonary effort is normal. No respiratory distress.     Breath sounds: Normal breath sounds. No stridor. No wheezing or rales.  Chest:     Chest wall: No tenderness.  Abdominal:     General: Bowel sounds are normal. There is no distension.     Palpations: Abdomen is soft. There is no mass.     Tenderness: There is no abdominal tenderness. There is no guarding or rebound.  Musculoskeletal:        General: No tenderness. Normal range of motion.     Cervical back: Normal range of motion and neck supple. No rigidity or tenderness.     Right lower leg: No edema.     Left lower leg: No edema.  Lymphadenopathy:     Cervical: No cervical adenopathy.  Skin:    General: Skin is warm.     Coloration: Skin is not jaundiced or pale.     Findings: No bruising, erythema, lesion or rash.  Neurological:     General: No focal deficit present.     Mental Status: She is alert and oriented to person, place, and time. Mental  status is at baseline.     Cranial Nerves: No cranial nerve deficit.     Sensory: No sensory deficit.     Motor: No weakness.     Coordination: Coordination normal.     Gait: Gait normal.  Psychiatric:        Behavior: Behavior normal.        Thought Content: Thought content normal.        Judgment: Judgment normal.           Assessment & Plan:  Fatty liver - Plan: Hepatic function panel  Chronic sinusitis, unspecified location - Plan: Ambulatory referral to Allergy  Mitral valve annular calcification  Aortic atherosclerosis (HCC)  Post-concussion  headache  Osteopenia, unspecified location  General medical exam Patient had atherosclerosis in the aorta on CAT scan.  She is currently on a statin and her cholesterol is much better as shown above.  Her blood pressure today is excellent.  She had possible fatty liver disease seen on the CT scan.  I will check liver function test to ensure that there is no active inflammation.  The remainder of her lab work is excellent.  She is due for a bone density test next year.  She is taking calcium and vitamin D but she has borderline osteoporosis.  Her mammogram is due in November.  She received her flu shot today.  I recommended a COVID booster.  I do not feel that she would benefit from an RSV vaccination.  The remainder of her preventative care is up-to-date.  I refilled her ibuprofen.  We discussed trying gabapentin for postconcussive headaches but she declines that at the present time.  She would like to see an allergist for her chronic sinusitis

## 2021-11-09 LAB — CBC WITH DIFFERENTIAL/PLATELET
Absolute Monocytes: 456 cells/uL (ref 200–950)
Basophils Absolute: 29 cells/uL (ref 0–200)
Basophils Relative: 0.6 %
Eosinophils Absolute: 132 cells/uL (ref 15–500)
Eosinophils Relative: 2.7 %
HCT: 42.7 % (ref 35.0–45.0)
Hemoglobin: 14.5 g/dL (ref 11.7–15.5)
Lymphs Abs: 1774 cells/uL (ref 850–3900)
MCH: 31.5 pg (ref 27.0–33.0)
MCHC: 34 g/dL (ref 32.0–36.0)
MCV: 92.8 fL (ref 80.0–100.0)
MPV: 11.3 fL (ref 7.5–12.5)
Monocytes Relative: 9.3 %
Neutro Abs: 2509 cells/uL (ref 1500–7800)
Neutrophils Relative %: 51.2 %
Platelets: 206 10*3/uL (ref 140–400)
RBC: 4.6 10*6/uL (ref 3.80–5.10)
RDW: 11.8 % (ref 11.0–15.0)
Total Lymphocyte: 36.2 %
WBC: 4.9 10*3/uL (ref 3.8–10.8)

## 2021-11-09 LAB — BASIC METABOLIC PANEL
BUN: 20 mg/dL (ref 7–25)
CO2: 24 mmol/L (ref 20–32)
Calcium: 9 mg/dL (ref 8.6–10.4)
Chloride: 108 mmol/L (ref 98–110)
Creat: 0.79 mg/dL (ref 0.50–1.05)
Glucose, Bld: 87 mg/dL (ref 65–99)
Potassium: 4.3 mmol/L (ref 3.5–5.3)
Sodium: 142 mmol/L (ref 135–146)

## 2021-11-09 LAB — LIPID PANEL
Cholesterol: 125 mg/dL (ref <200)
HDL: 51 mg/dL (ref >=50)
LDL Cholesterol (Calc): 51 mg/dL (calc)
Non-HDL Cholesterol (Calc): 74 mg/dL (calc) (ref <130)
Total CHOL/HDL Ratio: 2.5 (calc) (ref <5.0)
Triglycerides: 148 mg/dL (ref <150)

## 2021-11-09 LAB — ADD ON HEP FUNCT PNL
AG Ratio: 2 (calc) (ref 1.0–2.5)
ALT: 20 U/L (ref 6–29)
AST: 18 U/L (ref 10–35)
Albumin: 4.2 g/dL (ref 3.6–5.1)
Alkaline phosphatase (APISO): 54 U/L (ref 37–153)
Bilirubin, Direct: 0.1 mg/dL (ref 0.0–0.2)
Globulin: 2.1 g/dL (calc) (ref 1.9–3.7)
Indirect Bilirubin: 0.4 mg/dL (calc) (ref 0.2–1.2)
Total Bilirubin: 0.5 mg/dL (ref 0.2–1.2)
Total Protein: 6.3 g/dL (ref 6.1–8.1)

## 2021-11-09 LAB — TEST AUTHORIZATION 2

## 2021-11-11 DIAGNOSIS — D225 Melanocytic nevi of trunk: Secondary | ICD-10-CM | POA: Diagnosis not present

## 2021-11-11 DIAGNOSIS — L821 Other seborrheic keratosis: Secondary | ICD-10-CM | POA: Diagnosis not present

## 2021-11-11 DIAGNOSIS — D485 Neoplasm of uncertain behavior of skin: Secondary | ICD-10-CM | POA: Diagnosis not present

## 2021-11-11 DIAGNOSIS — L820 Inflamed seborrheic keratosis: Secondary | ICD-10-CM | POA: Diagnosis not present

## 2021-11-25 DIAGNOSIS — R1032 Left lower quadrant pain: Secondary | ICD-10-CM | POA: Diagnosis not present

## 2021-11-25 DIAGNOSIS — K5730 Diverticulosis of large intestine without perforation or abscess without bleeding: Secondary | ICD-10-CM | POA: Diagnosis not present

## 2021-11-25 DIAGNOSIS — Z86010 Personal history of colonic polyps: Secondary | ICD-10-CM | POA: Diagnosis not present

## 2021-12-10 ENCOUNTER — Encounter: Payer: Self-pay | Admitting: Family Medicine

## 2021-12-23 DIAGNOSIS — Z853 Personal history of malignant neoplasm of breast: Secondary | ICD-10-CM | POA: Diagnosis not present

## 2021-12-23 DIAGNOSIS — Z01419 Encounter for gynecological examination (general) (routine) without abnormal findings: Secondary | ICD-10-CM | POA: Diagnosis not present

## 2021-12-23 DIAGNOSIS — Z1231 Encounter for screening mammogram for malignant neoplasm of breast: Secondary | ICD-10-CM | POA: Diagnosis not present

## 2021-12-23 LAB — HM MAMMOGRAPHY

## 2021-12-29 ENCOUNTER — Other Ambulatory Visit: Payer: Self-pay | Admitting: Family Medicine

## 2022-01-06 ENCOUNTER — Other Ambulatory Visit: Payer: Self-pay | Admitting: Family Medicine

## 2022-01-06 ENCOUNTER — Encounter: Payer: Self-pay | Admitting: Family Medicine

## 2022-01-06 MED ORDER — LEVOFLOXACIN 500 MG PO TABS: 500.0000 mg | ORAL_TABLET | Freq: Every day | ORAL | 0 refills | Status: DC

## 2022-01-07 ENCOUNTER — Telehealth: Payer: Self-pay

## 2022-01-07 ENCOUNTER — Other Ambulatory Visit: Payer: Self-pay | Admitting: Family Medicine

## 2022-01-07 MED ORDER — AZITHROMYCIN 250 MG PO TABS: ORAL_TABLET | ORAL | 0 refills | Status: DC

## 2022-01-07 NOTE — Telephone Encounter (Signed)
Pt called requesting a change in her antibiotic that was sent in yesterday. Pt states she is unable to take Levaquin and asks if a Z-pack could be sent in instead? Thank you.

## 2022-01-09 ENCOUNTER — Ambulatory Visit (INDEPENDENT_AMBULATORY_CARE_PROVIDER_SITE_OTHER): Payer: Medicare Other | Admitting: Allergy & Immunology

## 2022-01-09 ENCOUNTER — Other Ambulatory Visit: Payer: Self-pay

## 2022-01-09 ENCOUNTER — Encounter: Payer: Self-pay | Admitting: Allergy & Immunology

## 2022-01-09 VITALS — BP 110/70 | HR 76 | Temp 97.6°F | Resp 12 | Ht 64.37 in | Wt 130.1 lb

## 2022-01-09 DIAGNOSIS — J3489 Other specified disorders of nose and nasal sinuses: Secondary | ICD-10-CM

## 2022-01-09 DIAGNOSIS — J3089 Other allergic rhinitis: Secondary | ICD-10-CM | POA: Diagnosis not present

## 2022-01-09 NOTE — Progress Notes (Signed)
NEW PATIENT  Date of Service/Encounter:  01/09/22  Consult requested by: Susy Frizzle, MD   Assessment:   Seasonal and perennial allergic rhinitis (grasses, weeds, trees, indoor molds, and outdoor molds)  Rhinorrhea  Rosacea  Recurrent kidney stones  Plan/Recommendations:    1. Seasonal and perennial allergic rhinitis - Testing today showed: grasses, weeds, trees, indoor molds, and outdoor molds. - Copy of test results provided.  - Avoidance measures provided. - Stop taking: Singulair and your current nasal spray - Continue with: loratadine (alternate antihistamines every month or so) - Start taking: Ryaltris (olopatadine/mometasone) two sprays per nostril 1-2 times daily as needed (we may need to send in two separate medications since your insurance might not cover the Ryaltris itself). - You can use an extra dose of the antihistamine, if needed, for breakthrough symptoms.  - Consider nasal saline rinses 1-2 times daily to remove allergens from the nasal cavities as well as help with mucous clearance (this is especially helpful to do before the nasal sprays are given) - Consider allergy shots as a means of long-term control. - Allergy shots "re-train" and "reset" the immune system to ignore environmental allergens and decrease the resulting immune response to those allergens (sneezing, itchy watery eyes, runny nose, nasal congestion, etc).    - Allergy shots improve symptoms in 75-85% of patients.  - We can discuss more at the next appointment if the medications are not working for you. - I would strongly consider allergy shots at this point since you have been dealing with this for so long.  2. Concern for food allergies - Testing was negative to the most common foods (ruling out >95% of all food allergens). - I would avoid peppers since you have the facial redness from them. - I would try pear at home. - Your are probably already eating yeast in breads and such. -  We can send blood work if you would like in the future.   3. Return in about 4 weeks (around 02/06/2022).    This note in its entirety was forwarded to the Provider who requested this consultation.  Subjective:   Brittany House is a 71 y.o. female presenting today for evaluation of  Chief Complaint  Patient presents with   Establish Care    Chronic Sinus infections. Nasal sprays aren't helping.   Allergy Testing    Retest. Last one 35 years ago.    Brittany House has a history of the following: Patient Active Problem List   Diagnosis Date Noted   Dermatochalasis of both eyelids 11/26/2020   Skin scar contracture 11/26/2020   Osteoporosis 08/27/2018   Menopausal syndrome 08/27/2018   Hyperlipidemia    Osteopenia     History obtained from: chart review and patient.  Brittany House was referred by Susy Frizzle, MD.     Brittany House is a 71 y.o. female presenting for an evaluation of environmental allergies .   She moved here in 2004. They moved here for a job and they have three gtrandchildre who live locally. She occasionally takes care of them. 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   She has noticed that the sprays tend to work for 45 minutes before everything closes up again. She is currently on montelukast and loratadine and Nasacort. She was referred to ENT (Dr. Redmond Baseman) and she has not seen him since March 2020. Sinus CT showed a slightly deviated septum that did not warrant surgery.   She  was tested 35 years ago in [place]. She reports a history of runny nose, which she thinks might be related to food ingestion, as well as some sneezing. She denies any exacerbating environments, although the outdoors might be a bit worse.   She occasionally takes doxycycline for rosacea. This is managed by Dr. Eulas Post (manages ocular complications) and Dr. Fontaine No (manages the skin).    She has a history of kidney stones.   Otherwise, there is no history of  other atopic diseases, including asthma, food allergies, drug allergies, stinging insect allergies, or contact dermatitis. There is no significant infectious history. Vaccinations are up to date.    Past Medical History: Patient Active Problem List   Diagnosis Date Noted   Dermatochalasis of both eyelids 11/26/2020   Skin scar contracture 11/26/2020   Osteoporosis 08/27/2018   Menopausal syndrome 08/27/2018   Hyperlipidemia    Osteopenia     Medication List:  Allergies as of 01/09/2022       Reactions   Penicillins Swelling, Rash   Has patient had a PCN reaction causing immediate rash, facial/tongue/throat swelling, SOB or lightheadedness with hypotension: Yes Has patient had a PCN reaction causing severe rash involving mucus membranes or skin necrosis: Yes Has patient had a PCN reaction that required hospitalization: No Has patient had a PCN reaction occurring within the last 10 years: No If all of the above answers are "NO", then may proceed with Cephalosporin use.        Medication List        Accurate as of January 09, 2022 11:59 PM. If you have any questions, ask your nurse or doctor.          azithromycin 250 MG tablet Commonly known as: ZITHROMAX 2 tabs poqday1, 1 tab poqday 2-5   b complex vitamins capsule Take 1 capsule by mouth daily.   Calcium 600 1500 (600 Ca) MG Tabs tablet Generic drug: calcium carbonate SMARTSIG:1 By Mouth   D 1000 25 MCG (1000 UT) capsule Generic drug: Cholecalciferol SMARTSIG:1 By Mouth   doxycycline 100 MG tablet Commonly known as: VIBRA-TABS Take 100 mg by mouth 2 (two) times daily.   ibuprofen 800 MG tablet Commonly known as: ADVIL Take 1 tablet (800 mg total) by mouth every 8 (eight) hours as needed.   loratadine 10 MG tablet Commonly known as: CLARITIN Take 1 tablet (10 mg total) by mouth daily.   metroNIDAZOLE 0.75 % cream Commonly known as: METROCREAM Apply 1 Application topically 2 (two) times daily.    montelukast 10 MG tablet Commonly known as: SINGULAIR Take 1 tablet (10 mg total) by mouth at bedtime.   multivitamin tablet Take 1 tablet by mouth daily.   rosuvastatin 10 MG tablet Commonly known as: Crestor Take 1 tablet (10 mg total) by mouth daily.   SYSTANE OP Apply to eye as needed. Eye drops   triamcinolone 55 MCG/ACT Aero nasal inhaler Commonly known as: NASACORT Place 2 sprays into the nose daily.        Birth History: non-contributory  Developmental History: non-contributory  Past Surgical History: Past Surgical History:  Procedure Laterality Date   BREAST SURGERY N/A    [employer] 09/18/2019   c-sections x 3     HERNIA REPAIR     MASTECTOMY       Family History: Family History  Problem Relation Age of Onset   Food Allergy Daughter    Allergic rhinitis Son    Heart disease Neg Hx    Cancer Neg  Hx      Social History: Brittany House lives at home in a house that is 70 years old. There is wood, tile, and carpeting in the main living areas and carpeting in the bedrooms. There is gas heating and central cooling. There are no animals inside or outside of the home. There are dust mite coverings on the bed, but not the pillows. There is no tobacco exposure in the home. She is currently retired. Three is a HEPA filter in the bedroom. There are no fume, chemical, or dust exposures in the home. They do not live near an interstate or industrial area.    Review of Systems  Constitutional: Negative.  Negative for fever, malaise/fatigue and weight loss.  HENT:  Positive for congestion and sinus pain. Negative for ear discharge and ear pain.        Positive for postnasal drip.   Eyes:  Negative for pain, discharge and redness.  Respiratory:  Negative for cough, sputum production, shortness of breath and wheezing.   Cardiovascular: Negative.  Negative for chest pain and palpitations.  Gastrointestinal:  Negative for abdominal pain, heartburn, nausea and vomiting.  Skin:  Negative.  Negative for itching and rash.  Neurological:  Negative for dizziness and headaches.  Endo/Heme/Allergies:  Positive for environmental allergies. Does not bruise/bleed easily.       Objective:   Blood pressure 110/70, pulse 76, temperature 97.6 F (36.4 C), temperature source Temporal, resp. rate 12, height 5' 4.37" (1.635 m), weight 130 lb 1.6 oz (59 kg), SpO2 97 %. Body mass index is 22.08 kg/m.     Physical Exam Vitals reviewed.  Constitutional:      Appearance: She is well-developed.  HENT:     Head: Normocephalic and atraumatic.     Right Ear: Tympanic membrane, ear canal and external ear normal. No drainage, swelling or tenderness. Tympanic membrane is not injected, scarred, erythematous, retracted or bulging.     Left Ear: Tympanic membrane, ear canal and external ear normal. No drainage, swelling or tenderness. Tympanic membrane is not injected, scarred, erythematous, retracted or bulging.     Nose: No nasal deformity, septal deviation, mucosal edema or rhinorrhea.     Right Turbinates: Enlarged, swollen and pale.     Left Turbinates: Enlarged, swollen and pale.     Right Sinus: No maxillary sinus tenderness or frontal sinus tenderness.     Left Sinus: No maxillary sinus tenderness or frontal sinus tenderness.     Mouth/Throat:     Lips: Pink.     Mouth: Mucous membranes are moist. Mucous membranes are not pale and not dry.     Pharynx: Uvula midline.     Comments: Cobblestoning in the posterior oropharynx.  Eyes:     General:        Right eye: No discharge.        Left eye: No discharge.     Conjunctiva/sclera: Conjunctivae normal.     Right eye: Right conjunctiva is not injected. No chemosis.    Left eye: Left conjunctiva is not injected. No chemosis.    Pupils: Pupils are equal, round, and reactive to light.  Cardiovascular:     Rate and Rhythm: Normal rate and regular rhythm.     Heart sounds: Normal heart sounds.  Pulmonary:     Effort: Pulmonary  effort is normal. No tachypnea, accessory muscle usage or respiratory distress.     Breath sounds: Normal breath sounds. No wheezing, rhonchi or rales.  Chest:  Chest wall: No tenderness.  Abdominal:     Tenderness: There is no abdominal tenderness. There is no guarding or rebound.  Lymphadenopathy:     Head:     Right side of head: No submandibular, tonsillar or occipital adenopathy.     Left side of head: No submandibular, tonsillar or occipital adenopathy.     Cervical: No cervical adenopathy.  Skin:    Coloration: Skin is not pale.     Findings: No abrasion, erythema, petechiae or rash. Rash is not papular, urticarial or vesicular.  Neurological:     Mental Status: She is alert.  Psychiatric:        Behavior: Behavior is cooperative.      Diagnostic studies:   Allergy Studies:    Airborne Adult Perc - 01/09/22 1425     Time Antigen Placed 1419    Allergen Manufacturer Lavella Hammock    Location Back    Number of Test 59    1. Control-Buffer 50% Glycerol Negative    2. Control-Histamine 1 mg/ml 2+    3. Albumin saline Negative    4. [place] Negative    5. [place] 2+    6. Johnson Negative    7. Dorrington Blue Negative    8. Meadow Fescue 2+    9. Perennial Rye 3+    10. Sweet Vernal Negative    11. Timothy Negative    12. Cocklebur Negative    13. Burweed Marshelder 3+    14. Ragweed, short Negative    15. Ragweed, Giant Negative    16. Plantain,  English Negative    17. Lamb's Quarters Negative    18. Sheep Sorrell Negative    19. Rough Pigweed Negative    20. Marsh Elder, Rough Negative    21. Mugwort, Common Negative    22. Ash mix Negative    23. Birch mix Negative    24. Beech American Negative    25. Box, Elder Negative    26. Cedar, red Negative    27. Cottonwood, [place] Negative    28. Elm mix Negative    29. Hickory Negative    30. Maple mix Negative    31. Oak, [place] mix Negative    32. Pecan Pollen Negative    33. Pine mix Negative    34.  Sycamore Eastern Negative    35. Zion, Black Pollen Negative    36. Alternaria alternata Negative    37. Cladosporium Herbarum Negative    38. Aspergillus mix Negative    39. Penicillium mix Negative    40. Bipolaris sorokiniana (Helminthosporium) Negative    41. Drechslera spicifera (Curvularia) Negative    42. Mucor plumbeus Negative    43. Fusarium moniliforme Negative    44. Aureobasidium pullulans (pullulara) Negative    45. Rhizopus oryzae Negative    46. Botrytis cinera Negative    47. Epicoccum nigrum Negative    48. Phoma betae Negative    49. Candida Albicans Negative    50. Trichophyton mentagrophytes Negative    51. Mite, D Farinae  5,000 AU/ml Negative    52. Mite, D Pteronyssinus  5,000 AU/ml Negative    53. Cat Hair 10,000 BAU/ml Negative    54.  Dog Epithelia Negative    55. Mixed Feathers Negative    56. Horse Epithelia Negative    57. Cockroach, German Negative    58. Mouse Negative    59. Tobacco Leaf Negative  Intradermal - 01/09/22 1441     Time Antigen Placed 1445    Allergen Manufacturer Lavella Hammock    Location Arm    Number of Test 13    Intradermal Select    Control Negative    Johnson 3+    Ragweed mix Negative    Weed mix 3+    Tree mix 2+    Mold 1 2+    Mold 2 3+    Mold 3 1+    Mold 4 3+    Cat Negative    Dog Negative    Cockroach Negative    Mite mix Negative             Food Adult Perc - 01/09/22 1400     Time Antigen Placed 1419    Allergen Manufacturer Lavella Hammock    Location Back    Number of allergen test 17    1. Peanut Negative    2. Soybean Negative    3. Wheat Negative    4. Sesame Negative    5. Milk, cow Negative    6. Egg White, Chicken Negative    7. Casein Negative    8. Shellfish Mix Negative    9. Fish Mix Negative    10. Cashew Negative    11. Pecan Food Negative    12. [place] Negative    13. Almond Negative    14. Hazelnut Negative    15. [place] nut Negative    16. Coconut Negative     17. Pistachio Negative            Allergy testing results were read and interpreted by myself, documented by clinical staff.         Salvatore Marvel, MD [facility]

## 2022-01-09 NOTE — Patient Instructions (Addendum)
1. Seasonal and perennial allergic rhinitis - Testing today showed: grasses, weeds, trees, indoor molds, and outdoor molds. - Copy of test results provided.  - Avoidance measures provided. - Stop taking: Singulair and your current nasal spray - Continue with: loratadine (alternate antihistamines every month or so) - Start taking: Ryaltris (olopatadine/mometasone) two sprays per nostril 1-2 times daily as needed (we may need to send in two separate medications since your insurance might not cover the Ryaltris itself). - You can use an extra dose of the antihistamine, if needed, for breakthrough symptoms.  - Consider nasal saline rinses 1-2 times daily to remove allergens from the nasal cavities as well as help with mucous clearance (this is especially helpful to do before the nasal sprays are given) - Consider allergy shots as a means of long-term control. - Allergy shots "re-train" and "reset" the immune system to ignore environmental allergens and decrease the resulting immune response to those allergens (sneezing, itchy watery eyes, runny nose, nasal congestion, etc).    - Allergy shots improve symptoms in 75-85% of patients.  - We can discuss more at the next appointment if the medications are not working for you. - I would strongly consider allergy shots at this point since you have been dealing with this for so long.  2. Concern for food allergies - Testing was negative to the most common foods (ruling out >95% of all food allergens). - I would avoid peppers since you have the facial redness from them. - I would try pear at home. - Your are probably already eating yeast in breads and such. - We can send blood work if you would like in the future.   3. Return in about 4 weeks (around 02/06/2022).    Please inform us of any Emergency Department visits, hospitalizations, or changes in symptoms. Call us before going to the ED for breathing or allergy symptoms since we might be able to fit you  in for a sick visit. Feel free to contact us anytime with any questions, problems, or concerns.  It was a pleasure to meet you today!  Websites that have reliable patient information: 1. American Academy of Asthma, Allergy, and Immunology: www.aaaai.org 2. Food Allergy Research and Education (FARE): foodallergy.org 3. Mothers of Asthmatics: http://www.asthmacommunitynetwork.org 4. American College of Allergy, Asthma, and Immunology: www.acaai.org   COVID-19 Vaccine Information can be found at: ShippingScam.co.uk For questions related to vaccine distribution or appointments, please email [email or web address] or call [phone].   We realize that you might be concerned about having an allergic reaction to the COVID19 vaccines. To help with that concern, WE ARE OFFERING THE COVID19 VACCINES IN OUR OFFICE! Ask the front desk for dates!     "Like" [place] on Facebook and Instagram for our latest updates!      A healthy democracy works best when [employer] participate! Make sure you are registered to vote! If you have moved or changed any of your contact information, you will need to get this updated before voting!  In some cases, you MAY be able to register to vote online: [email or web address]      Airborne Adult Perc - 01/09/22 1425     Time Antigen Placed [place] Lavella Hammock    Location Back    Number of Test 59    1. Control-Buffer 50% Glycerol Negative    2. Control-Histamine 1 mg/ml 2+    3. Albumin saline Negative    4. [place] Negative    5. [place]  2+    6. Johnson Negative    7. [employer] Blue Negative    8. Meadow Fescue 2+    9. Perennial Rye 3+    10. Sweet Vernal Negative    11. Timothy Negative    12. Cocklebur Negative    13. Burweed Marshelder 3+    14. Ragweed, short Negative    15. Ragweed, Giant Negative    16. Plantain,  English Negative    17. Lamb's  Quarters Negative    18. Sheep Sorrell Negative    19. Rough Pigweed Negative    20. Marsh Elder, Rough Negative    21. Mugwort, Common Negative    22. Ash mix Negative    23. Birch mix Negative    24. Beech American Negative    25. Box, Elder Negative    26. Cedar, red Negative    27. Cottonwood, [place] Negative    28. Elm mix Negative    29. Hickory Negative    30. Maple mix Negative    31. Oak, [place] mix Negative    32. Pecan Pollen Negative    33. Pine mix Negative    34. Sycamore Eastern Negative    35. [place], Black Pollen Negative    36. Alternaria alternata Negative    37. Cladosporium Herbarum Negative    38. Aspergillus mix Negative    39. Penicillium mix Negative    40. Bipolaris sorokiniana (Helminthosporium) Negative    41. Drechslera spicifera (Curvularia) Negative    42. Mucor plumbeus Negative    43. Fusarium moniliforme Negative    44. Aureobasidium pullulans (pullulara) Negative    45. Rhizopus oryzae Negative    46. Botrytis cinera Negative    47. Epicoccum nigrum Negative    48. Phoma betae Negative    49. Candida Albicans Negative    50. Trichophyton mentagrophytes Negative    51. Mite, D Farinae  5,000 AU/ml Negative    52. Mite, D Pteronyssinus  5,000 AU/ml Negative    53. Cat Hair 10,000 BAU/ml Negative    54.  Dog Epithelia Negative    55. Mixed Feathers Negative    56. Horse Epithelia Negative    57. Cockroach, German Negative    58. Mouse Negative    59. Tobacco Leaf Negative             Intradermal - 01/09/22 1441     Time Antigen Placed 1445    Allergen Manufacturer Lavella Hammock    Location Arm    Number of Test 13    Intradermal Select    Control Negative    Johnson 3+    Ragweed mix Negative    Weed mix 3+    Tree mix 2+    Mold 1 2+    Mold 2 3+    Mold 3 1+    Mold 4 3+    Cat Negative    Dog Negative    Cockroach Negative    Mite mix Negative             Food Adult Perc - 01/09/22 1400     Time Antigen Placed  1419    Allergen Manufacturer Lavella Hammock    Location Back    Number of allergen test 17    1. Peanut Negative    2. Soybean Negative    3. Wheat Negative    4. Sesame Negative    5. Milk, cow Negative    6. Egg White, Chicken Negative  7. Casein Negative    8. Shellfish Mix Negative    9. Fish Mix Negative    10. Cashew Negative    11. Pecan Food Negative    12. [place] Negative    13. Almond Negative    14. Hazelnut Negative    15. [place] nut Negative    16. Coconut Negative    17. Pistachio Negative             Reducing Pollen Exposure  The American Academy of Allergy, Asthma and Immunology suggests the following steps to reduce your exposure to pollen during allergy seasons.    Do not hang sheets or clothing out to dry; pollen may collect on these items. Do not mow lawns or spend time around freshly cut grass; mowing stirs up pollen. Keep windows closed at night.  Keep car windows closed while driving. Minimize morning activities outdoors, a time when pollen counts are usually at their highest. Stay indoors as much as possible when pollen counts or humidity is high and on windy days when pollen tends to remain in the air longer. Use air conditioning when possible.  Many air conditioners have filters that trap the pollen spores. Use a HEPA room air filter to remove pollen form the indoor air you breathe.  Control of Mold Allergen   Mold and fungi can grow on a variety of surfaces provided certain temperature and moisture conditions exist.  Outdoor molds grow on plants, decaying vegetation and soil.  The major outdoor mold, Alternaria and Cladosporium, are found in very high numbers during hot and dry conditions.  Generally, a late Summer - Fall peak is seen for common outdoor fungal spores.  Rain will temporarily lower outdoor mold spore count, but counts rise rapidly when the rainy period ends.  The most important indoor molds are Aspergillus and Penicillium.  Dark,  humid and poorly ventilated basements are ideal sites for mold growth.  The next most common sites of mold growth are the bathroom and the kitchen.  Outdoor (Seasonal) Mold Control  Positive outdoor molds via skin testing: Alternaria, Cladosporium, Bipolaris (Helminthsporium), Drechslera (Curvalaria), and Mucor  Use air conditioning and keep windows closed Avoid exposure to decaying vegetation. Avoid leaf raking. Avoid grain handling. Consider wearing a face mask if working in moldy areas.   Indoor (Perennial) Mold Control   Positive indoor molds via skin testing: Aspergillus, Penicillium, Fusarium, Aureobasidium (Pullulara), and Rhizopus  Maintain humidity below 50%. Clean washable surfaces with 5% bleach solution. Remove sources e.g. contaminated carpets.   Allergy Shots   Allergies are the result of a chain reaction that starts in the immune system. Your immune system controls how your body defends itself. For instance, if you have an allergy to pollen, your immune system identifies pollen as an invader or allergen. Your immune system overreacts by producing antibodies called Immunoglobulin E (IgE). These antibodies travel to cells that release chemicals, causing an allergic reaction.  The concept behind allergy immunotherapy, whether it is received in the form of shots or tablets, is that the immune system can be desensitized to specific allergens that trigger allergy symptoms. Although it requires time and patience, the payback can be long-term relief.  How Do Allergy Shots Work?  Allergy shots work much like a vaccine. Your body responds to injected amounts of a particular allergen given in increasing doses, eventually developing a resistance and tolerance to it. Allergy shots can lead to decreased, minimal or no allergy symptoms.  There generally are two  phases: build-up and maintenance. Build-up often ranges from three to six months and involves receiving injections with  increasing amounts of the allergens. The shots are typically given once or twice a week, though more rapid build-up schedules are sometimes used.  The maintenance phase begins when the most effective dose is reached. This dose is different for each person, depending on how allergic you are and your response to the build-up injections. Once the maintenance dose is reached, there are longer periods between injections, typically two to four weeks.  Occasionally doctors give cortisone-type shots that can temporarily reduce allergy symptoms. These types of shots are different and should not be confused with allergy immunotherapy shots.  Who Can Be Treated with Allergy Shots?  Allergy shots may be a good treatment approach for people with allergic rhinitis (hay fever), allergic asthma, conjunctivitis (eye allergy) or stinging insect allergy.   Before deciding to begin allergy shots, you should consider:   The length of allergy season and the severity of your symptoms  Whether medications and/or changes to your environment can control your symptoms  Your desire to avoid long-term medication use  Time: allergy immunotherapy requires a major time commitment  Cost: may vary depending on your insurance coverage  Allergy shots for children age 37 and older are effective and often well tolerated. They might prevent the onset of new allergen sensitivities or the progression to asthma.  Allergy shots are not started on patients who are pregnant but can be continued on patients who become pregnant while receiving them. In some patients with other medical conditions or who take certain common medications, allergy shots may be of risk. It is important to mention other medications you talk to your allergist.   When Will I Feel Better?  Some may experience decreased allergy symptoms during the build-up phase. For others, it may take as long as 12 months on the maintenance dose. If there is no improvement after  a year of maintenance, your allergist will discuss other treatment options with you.  If you aren't responding to allergy shots, it may be because there is not enough dose of the allergen in your vaccine or there are missing allergens that were not identified during your allergy testing. Other reasons could be that there are high levels of the allergen in your environment or major exposure to non-allergic triggers like tobacco smoke.  What Is the Length of Treatment?  Once the maintenance dose is reached, allergy shots are generally continued for three to five years. The decision to stop should be discussed with your allergist at that time. Some people may experience a permanent reduction of allergy symptoms. Others may relapse and a longer course of allergy shots can be considered.  What Are the Possible Reactions?  The two types of adverse reactions that can occur with allergy shots are local and systemic. Common local reactions include very mild redness and swelling at the injection site, which can happen immediately or several hours after. A systemic reaction, which is less common, affects the entire body or a particular body system. They are usually mild and typically respond quickly to medications. Signs include increased allergy symptoms such as sneezing, a stuffy nose or hives.  Rarely, a serious systemic reaction called anaphylaxis can develop. Symptoms include swelling in the throat, wheezing, a feeling of tightness in the chest, nausea or dizziness. Most serious systemic reactions develop within 30 minutes of allergy shots. This is why it is strongly recommended you wait in your doctor's  office for 30 minutes after your injections. Your allergist is trained to watch for reactions, and his or her staff is trained and equipped with the proper medications to identify and treat them.  Who Should Administer Allergy Shots?  The preferred location for receiving shots is your prescribing  allergist's office. Injections can sometimes be given at another facility where the physician and staff are trained to recognize and treat reactions, and have received instructions by your prescribing allergist.

## 2022-01-13 ENCOUNTER — Other Ambulatory Visit: Payer: Self-pay

## 2022-01-13 DIAGNOSIS — J329 Chronic sinusitis, unspecified: Secondary | ICD-10-CM

## 2022-01-13 DIAGNOSIS — H1132 Conjunctival hemorrhage, left eye: Secondary | ICD-10-CM | POA: Diagnosis not present

## 2022-01-13 MED ORDER — LORATADINE 10 MG PO TABS: 10.0000 mg | ORAL_TABLET | Freq: Every day | ORAL | 11 refills | Status: DC

## 2022-02-06 ENCOUNTER — Ambulatory Visit: Payer: Medicare Other | Admitting: Allergy & Immunology

## 2022-02-12 DIAGNOSIS — N200 Calculus of kidney: Secondary | ICD-10-CM | POA: Diagnosis not present

## 2022-02-20 ENCOUNTER — Encounter: Payer: Self-pay | Admitting: Family Medicine

## 2022-03-31 DIAGNOSIS — R1032 Left lower quadrant pain: Secondary | ICD-10-CM | POA: Diagnosis not present

## 2022-03-31 DIAGNOSIS — K5730 Diverticulosis of large intestine without perforation or abscess without bleeding: Secondary | ICD-10-CM | POA: Diagnosis not present

## 2022-05-20 DIAGNOSIS — D2262 Melanocytic nevi of left upper limb, including shoulder: Secondary | ICD-10-CM | POA: Diagnosis not present

## 2022-05-20 DIAGNOSIS — D225 Melanocytic nevi of trunk: Secondary | ICD-10-CM | POA: Diagnosis not present

## 2022-05-20 DIAGNOSIS — D2271 Melanocytic nevi of right lower limb, including hip: Secondary | ICD-10-CM | POA: Diagnosis not present

## 2022-05-20 DIAGNOSIS — L918 Other hypertrophic disorders of the skin: Secondary | ICD-10-CM | POA: Diagnosis not present

## 2022-05-20 DIAGNOSIS — L821 Other seborrheic keratosis: Secondary | ICD-10-CM | POA: Diagnosis not present

## 2022-05-20 DIAGNOSIS — L820 Inflamed seborrheic keratosis: Secondary | ICD-10-CM | POA: Diagnosis not present

## 2022-05-20 DIAGNOSIS — D2272 Melanocytic nevi of left lower limb, including hip: Secondary | ICD-10-CM | POA: Diagnosis not present

## 2022-05-20 DIAGNOSIS — D1801 Hemangioma of skin and subcutaneous tissue: Secondary | ICD-10-CM | POA: Diagnosis not present

## 2022-06-18 ENCOUNTER — Encounter: Payer: Self-pay | Admitting: Family Medicine

## 2022-06-19 ENCOUNTER — Ambulatory Visit (INDEPENDENT_AMBULATORY_CARE_PROVIDER_SITE_OTHER): Payer: Medicare Other | Admitting: Family Medicine

## 2022-06-19 ENCOUNTER — Encounter: Payer: Self-pay | Admitting: Family Medicine

## 2022-06-19 VITALS — BP 118/70 | HR 91 | Temp 98.1°F | Ht 64.37 in | Wt 131.4 lb

## 2022-06-19 DIAGNOSIS — R1031 Right lower quadrant pain: Secondary | ICD-10-CM | POA: Insufficient documentation

## 2022-06-19 DIAGNOSIS — J0101 Acute recurrent maxillary sinusitis: Secondary | ICD-10-CM

## 2022-06-19 DIAGNOSIS — Z860100 Personal history of colon polyps, unspecified: Secondary | ICD-10-CM | POA: Insufficient documentation

## 2022-06-19 DIAGNOSIS — K219 Gastro-esophageal reflux disease without esophagitis: Secondary | ICD-10-CM | POA: Insufficient documentation

## 2022-06-19 DIAGNOSIS — R1032 Left lower quadrant pain: Secondary | ICD-10-CM | POA: Insufficient documentation

## 2022-06-19 DIAGNOSIS — Z1211 Encounter for screening for malignant neoplasm of colon: Secondary | ICD-10-CM | POA: Insufficient documentation

## 2022-06-19 MED ORDER — BENZONATATE 200 MG PO CAPS: 200.0000 mg | ORAL_CAPSULE | Freq: Three times a day (TID) | ORAL | 0 refills | Status: DC | PRN

## 2022-06-19 MED ORDER — PREDNISONE 20 MG PO TABS: ORAL_TABLET | ORAL | 0 refills | Status: DC

## 2022-06-19 MED ORDER — AZITHROMYCIN 250 MG PO TABS: ORAL_TABLET | ORAL | 0 refills | Status: DC

## 2022-06-19 MED ORDER — MONTELUKAST SODIUM 10 MG PO TABS: 10.0000 mg | ORAL_TABLET | Freq: Every day | ORAL | 5 refills | Status: DC

## 2022-06-19 NOTE — Progress Notes (Signed)
Subjective:    Patient ID: Brittany House, female    DOB: [birth year], 72 y.o.   MRN: [id number]  HPI  Patient is a very pleasant 72 year old Caucasian female who presents today with a sinus infection.  She has a history of chronic sinusitis.  She has seasonal allergies.  She recently saw an allergist and she jokingly states that she is allergic to everything that blooms outside.  As result, she has been battling head congestion and rhinorrhea for quite some time despite taking loratadine and Nasonex.  Over the last week she has developed pain and pressure in her frontal and maxillary sinuses.  Both eyes are swollen.  Her eyes are watering due to the swelling in her nose obstructing the nasolacrimal ducts.  She reports pain and pressure in both maxillary and frontal sinuses.  She reports headache.  She has been unable to sleep because she is not able to breathe at night due to the congestion, the postnasal drip, and the cough Past Medical History:  Diagnosis Date   Cancer (HCC)    breast   GERD (gastroesophageal reflux disease)    Hyperlipidemia    Osteopenia    Osteoporosis    Past Surgical History:  Procedure Laterality Date   BREAST SURGERY N/A    [employer]ia 09/18/2019   c-sections x 3     HERNIA REPAIR     MASTECTOMY     Current Outpatient Medications on File Prior to Visit  Medication Sig Dispense Refill   azithromycin (ZITHROMAX) 250 MG tablet 2 tabs poqday1, 1 tab poqday 2-5 (Patient not taking: Reported on 01/09/2022) 6 tablet 0   b complex vitamins capsule Take 1 capsule by mouth daily.     CALCIUM 600 1500 (600 Ca) MG TABS tablet SMARTSIG:1 By Mouth     D 1000 25 MCG (1000 UT) capsule SMARTSIG:1 By Mouth     doxycycline (VIBRA-TABS) 100 MG tablet Take 100 mg by mouth 2 (two) times daily.     ibuprofen (ADVIL) 800 MG tablet Take 1 tablet (800 mg total) by mouth every 8 (eight) hours as needed. 60 tablet 2   loratadine (CLARITIN) 10 MG tablet Take 1 tablet (10 mg total) by mouth  daily. 30 tablet 11   metroNIDAZOLE (METROCREAM) 0.75 % cream Apply 1 Application topically 2 (two) times daily.     montelukast (SINGULAIR) 10 MG tablet Take 1 tablet (10 mg total) by mouth at bedtime. (Patient not taking: Reported on 01/09/2022) 30 tablet 11   Multiple Vitamin (MULTIVITAMIN) tablet Take 1 tablet by mouth daily.     Polyethyl Glycol-Propyl Glycol (SYSTANE OP) Apply to eye as needed. Eye drops     rosuvastatin (CRESTOR) 10 MG tablet Take 1 tablet (10 mg total) by mouth daily. 90 tablet 3   triamcinolone (NASACORT) 55 MCG/ACT AERO nasal inhaler Place 2 sprays into the nose daily. 1 each 12   No current facility-administered medications on file prior to visit.   Allergies  Allergen Reactions   Penicillins Swelling and Rash    Has patient had a PCN reaction causing immediate rash, facial/tongue/throat swelling, SOB or lightheadedness with hypotension: Yes Has patient had a PCN reaction causing severe rash involving mucus membranes or skin necrosis: Yes Has patient had a PCN reaction that required hospitalization: No Has patient had a PCN reaction occurring within the last 10 years: No If all of the above answers are "NO", then may proceed with Cephalosporin use.    Social History   Socioeconomic History  Marital status: Married    Spouse name: Not on file   Number of children: Not on file   Years of education: Not on file   Highest education level: Not on file  Occupational History   Not on file  Tobacco Use   Smoking status: Never    Passive exposure: Never   Smokeless tobacco: Never  Vaping Use   Vaping Use: Never used  Substance and Sexual Activity   Alcohol use: Yes    Comment: Rare   Drug use: No   Sexual activity: Yes    Comment: married to Casimiro Needle.  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  Other Topics Concern   Not on file  Social History Narrative   Not on file   Social Determinants of Health   Financial Resource Strain: Not on file  Food Insecurity: Not on file   Transportation Needs: Not on file  Physical Activity: Not on file  Stress: Not on file  Social Connections: Not on file  Intimate Partner Violence: Not on file     Review of Systems  All other systems reviewed and are negative.      Objective:   Physical Exam Vitals reviewed.  Constitutional:      Appearance: Normal appearance. She is well-developed and normal weight.  HENT:     Head: Normocephalic and atraumatic.     Right Ear: Tympanic membrane, ear canal and external ear normal.     Left Ear: Tympanic membrane, ear canal and external ear normal.     Nose: Mucosal edema, congestion and rhinorrhea present.     Right Turbinates: Enlarged and swollen.     Left Turbinates: Enlarged and swollen.     Right Sinus: Maxillary sinus tenderness and frontal sinus tenderness present.     Mouth/Throat:     Pharynx: No oropharyngeal exudate.  Eyes:     General: Allergic shiner present.  Cardiovascular:     Rate and Rhythm: Normal rate and regular rhythm.     Heart sounds: Normal heart sounds. No murmur heard.    No friction rub. No gallop.  Pulmonary:     Effort: Pulmonary effort is normal. No respiratory distress.     Breath sounds: No wheezing, rhonchi or rales.  Chest:     Chest wall: No tenderness.  Abdominal:     General: Bowel sounds are normal. There is no distension.     Palpations: Abdomen is soft.     Tenderness: There is no abdominal tenderness. There is no guarding or rebound.  Musculoskeletal:     Cervical back: Neck supple.  Neurological:     Mental Status: She is alert.     Both eyelids are swollen.  The skin is violaceous in color and looks like allergic shiners.  Eyes are itchy and watery.      Assessment & Plan:  Acute recurrent maxillary sinusitis I definitely believe allergies are the trigger.  I will start Singulair 10 mg a day as an adjunctive therapy to the loratadine and Nasonex hopefully to help prevent the future.  To treat this current  exacerbation, begin prednisone coupled with a Z-Pak and encouraged the patient to use Nettie pot to facilitate sinus drainage

## 2022-06-23 ENCOUNTER — Encounter: Payer: Self-pay | Admitting: Family Medicine

## 2022-07-08 ENCOUNTER — Encounter: Payer: Self-pay | Admitting: Family Medicine

## 2022-07-08 ENCOUNTER — Telehealth: Payer: Self-pay

## 2022-07-08 NOTE — Telephone Encounter (Signed)
My Chart message from pt:  I seem to be approaching full circle. I have done everything you have asked. I have not stopped the meds you prescribed. The post nasal drip has not stopped, the urge to cough is fairly constant, still bringing up congestion. Is there anything to tweak? I was feeling better, but last week or so sinuses get filled during the night so I am back to sleepless nights. thoughts?   Pt saw Dr. Tanya Nones on 06/19/2022 and was advised:  I definitely believe allergies are the trigger. I will start Singulair 10 mg a day as an adjunctive therapy to the loratadine and Nasonex hopefully to help prevent the future. To treat this current exacerbation, begin prednisone coupled with a Z-Pak and encouraged the patient to use Nettie pot to facilitate sinus drainage.  Do you have any suggestions? Thank you.

## 2022-07-09 ENCOUNTER — Other Ambulatory Visit: Payer: Self-pay | Admitting: Family Medicine

## 2022-07-09 MED ORDER — DOXYCYCLINE HYCLATE 100 MG PO TABS: 100.0000 mg | ORAL_TABLET | Freq: Two times a day (BID) | ORAL | 0 refills | Status: DC

## 2022-07-15 ENCOUNTER — Other Ambulatory Visit: Payer: Self-pay | Admitting: Family Medicine

## 2022-07-15 ENCOUNTER — Encounter: Payer: Self-pay | Admitting: Family Medicine

## 2022-07-15 DIAGNOSIS — J320 Chronic maxillary sinusitis: Secondary | ICD-10-CM

## 2022-07-24 ENCOUNTER — Other Ambulatory Visit: Payer: Self-pay | Admitting: Family Medicine

## 2022-07-24 NOTE — Telephone Encounter (Signed)
Last OV 06/19/22, within protocol.  Requested Prescriptions  Pending Prescriptions Disp Refills   rosuvastatin (CRESTOR) 10 MG tablet [Pharmacy Med Name: ROSUVASTATIN 10MG  TABLETS] 90 tablet 3    Sig: TAKE 1 TABLET(10 MG) BY MOUTH DAILY     Cardiovascular:  Antilipid - Statins 2 Failed - 07/24/2022  1:36 PM      Failed - Valid encounter within last 12 months    Recent Outpatient Visits           1 year ago Dysfunction of both eustachian tubes   [facility] Pickard, Priscille Heidelberg, MD   1 year ago Post-concussion headache   [facility] Pickard, Priscille Heidelberg, MD   1 year ago Need for immunization against influenza   [facility] Donita Brooks, MD   1 year ago Seasonal allergic rhinitis due to pollen   [facility] Pickard, Priscille Heidelberg, MD   1 year ago Osteopenia, unspecified location   [facility] Pickard, Priscille Heidelberg, MD       Future Appointments             In 3 months Pickard, Priscille Heidelberg, MD [facility]            Failed - Lipid Panel in normal range within the last 12 months    Cholesterol  Date Value Ref Range Status  11/04/2021 125 <200 mg/dL Final   LDL Cholesterol (Calc)  Date Value Ref Range Status  11/04/2021 51 mg/dL (calc) Final    Comment:    Reference range: <100 . Desirable range <100 mg/dL for primary prevention;   <70 mg/dL for patients with CHD or diabetic patients  with > or = 2 CHD risk factors. Marland Kitchen LDL-C is now calculated using the Martin-Hopkins  calculation, which is a validated novel method providing  better accuracy than the Friedewald equation in the  estimation of LDL-C.  Horald Pollen et al. Lenox Ahr. 1610;960(45): 2061-2068  (http://education.[facility].com/faq/FAQ164)    HDL  Date Value Ref Range Status  11/04/2021 51 > OR = 50 mg/dL Final   Triglycerides  Date Value Ref Range Status  11/04/2021 148 <150 mg/dL Final          Passed - Cr in normal range and within 360 days    Creat  Date Value Ref Range Status  11/04/2021 0.79 0.50 - 1.05 mg/dL Final         Passed - Patient is not pregnant

## 2022-07-28 DIAGNOSIS — M25562 Pain in left knee: Secondary | ICD-10-CM | POA: Diagnosis not present

## 2022-07-28 DIAGNOSIS — M7062 Trochanteric bursitis, left hip: Secondary | ICD-10-CM | POA: Diagnosis not present

## 2022-07-30 DIAGNOSIS — M1712 Unilateral primary osteoarthritis, left knee: Secondary | ICD-10-CM | POA: Diagnosis not present

## 2022-07-30 DIAGNOSIS — M7062 Trochanteric bursitis, left hip: Secondary | ICD-10-CM | POA: Diagnosis not present

## 2022-08-05 ENCOUNTER — Ambulatory Visit
Admission: RE | Admit: 2022-08-05 | Discharge: 2022-08-05 | Disposition: A | Discharge status: Home / Self Care | Payer: Medicare Other | Source: Ambulatory Visit | Attending: Family Medicine | Admitting: Family Medicine

## 2022-08-05 DIAGNOSIS — J329 Chronic sinusitis, unspecified: Secondary | ICD-10-CM | POA: Diagnosis not present

## 2022-08-05 DIAGNOSIS — J320 Chronic maxillary sinusitis: Secondary | ICD-10-CM

## 2022-08-08 ENCOUNTER — Encounter: Payer: Self-pay | Admitting: Family Medicine

## 2022-08-12 ENCOUNTER — Other Ambulatory Visit: Payer: Self-pay | Admitting: Family Medicine

## 2022-08-12 MED ORDER — AZELASTINE-FLUTICASONE 137-50 MCG/ACT NA SUSP: 1.0000 | Freq: Two times a day (BID) | NASAL | 1 refills | Status: DC

## 2022-08-20 DIAGNOSIS — M1712 Unilateral primary osteoarthritis, left knee: Secondary | ICD-10-CM | POA: Diagnosis not present

## 2022-09-03 DIAGNOSIS — M1712 Unilateral primary osteoarthritis, left knee: Secondary | ICD-10-CM | POA: Diagnosis not present

## 2022-09-03 DIAGNOSIS — M7062 Trochanteric bursitis, left hip: Secondary | ICD-10-CM | POA: Diagnosis not present

## 2022-10-15 DIAGNOSIS — M1712 Unilateral primary osteoarthritis, left knee: Secondary | ICD-10-CM | POA: Diagnosis not present

## 2022-10-15 DIAGNOSIS — M25511 Pain in right shoulder: Secondary | ICD-10-CM | POA: Diagnosis not present

## 2022-10-20 ENCOUNTER — Encounter: Payer: Self-pay | Admitting: Family Medicine

## 2022-10-20 DIAGNOSIS — M7541 Impingement syndrome of right shoulder: Secondary | ICD-10-CM | POA: Diagnosis not present

## 2022-10-28 ENCOUNTER — Encounter: Payer: Self-pay | Admitting: Family Medicine

## 2022-10-29 DIAGNOSIS — M7541 Impingement syndrome of right shoulder: Secondary | ICD-10-CM | POA: Diagnosis not present

## 2022-11-05 DIAGNOSIS — M7541 Impingement syndrome of right shoulder: Secondary | ICD-10-CM | POA: Diagnosis not present

## 2022-11-06 ENCOUNTER — Other Ambulatory Visit: Payer: Medicare Other

## 2022-11-06 DIAGNOSIS — E785 Hyperlipidemia, unspecified: Secondary | ICD-10-CM | POA: Diagnosis not present

## 2022-11-06 DIAGNOSIS — K760 Fatty (change of) liver, not elsewhere classified: Secondary | ICD-10-CM | POA: Diagnosis not present

## 2022-11-06 DIAGNOSIS — M7541 Impingement syndrome of right shoulder: Secondary | ICD-10-CM | POA: Diagnosis not present

## 2022-11-06 DIAGNOSIS — E559 Vitamin D deficiency, unspecified: Secondary | ICD-10-CM | POA: Diagnosis not present

## 2022-11-07 LAB — COMPLETE METABOLIC PANEL WITH GFR
AG Ratio: 1.7 (calc) (ref 1.0–2.5)
ALT: 19 U/L (ref 6–29)
AST: 26 U/L (ref 10–35)
Albumin: 4 g/dL (ref 3.6–5.1)
Alkaline phosphatase (APISO): 56 U/L (ref 37–153)
BUN: 13 mg/dL (ref 7–25)
CO2: 24 mmol/L (ref 20–32)
Calcium: 9.1 mg/dL (ref 8.6–10.4)
Chloride: 106 mmol/L (ref 98–110)
Creat: 0.63 mg/dL (ref 0.60–1.00)
Globulin: 2.3 g/dL (ref 1.9–3.7)
Glucose, Bld: 91 mg/dL (ref 65–99)
Potassium: 4.6 mmol/L (ref 3.5–5.3)
Sodium: 141 mmol/L (ref 135–146)
Total Bilirubin: 0.8 mg/dL (ref 0.2–1.2)
Total Protein: 6.3 g/dL (ref 6.1–8.1)
eGFR: 95 mL/min/{1.73_m2} (ref >=60)

## 2022-11-07 LAB — CBC WITH DIFFERENTIAL/PLATELET
Absolute Monocytes: 486 {cells}/uL (ref 200–950)
Basophils Absolute: 50 {cells}/uL (ref 0–200)
Basophils Relative: 1.1 %
Eosinophils Absolute: 117 {cells}/uL (ref 15–500)
Eosinophils Relative: 2.6 %
HCT: 45.3 % — ABNORMAL HIGH (ref 35.0–45.0)
Hemoglobin: 14.8 g/dL (ref 11.7–15.5)
Lymphs Abs: 1598 {cells}/uL (ref 850–3900)
MCH: 31.7 pg (ref 27.0–33.0)
MCHC: 32.7 g/dL (ref 32.0–36.0)
MCV: 97 fL (ref 80.0–100.0)
MPV: 11.6 fL (ref 7.5–12.5)
Monocytes Relative: 10.8 %
Neutro Abs: 2250 {cells}/uL (ref 1500–7800)
Neutrophils Relative %: 50 %
Platelets: 210 10*3/uL (ref 140–400)
RBC: 4.67 10*6/uL (ref 3.80–5.10)
RDW: 11.8 % (ref 11.0–15.0)
Total Lymphocyte: 35.5 %
WBC: 4.5 10*3/uL (ref 3.8–10.8)

## 2022-11-07 LAB — LIPID PANEL
Cholesterol: 144 mg/dL (ref <200)
HDL: 60 mg/dL (ref >=50)
LDL Cholesterol (Calc): 62 mg/dL
Non-HDL Cholesterol (Calc): 84 mg/dL (ref <130)
Total CHOL/HDL Ratio: 2.4 (calc) (ref <5.0)
Triglycerides: 138 mg/dL (ref <150)

## 2022-11-07 LAB — VITAMIN D 25 HYDROXY (VIT D DEFICIENCY, FRACTURES): Vit D, 25-Hydroxy: 61 ng/mL (ref 30–100)

## 2022-11-10 ENCOUNTER — Encounter: Payer: Medicare Other | Admitting: Family Medicine

## 2022-11-10 DIAGNOSIS — H57812 Brow ptosis, left: Secondary | ICD-10-CM | POA: Diagnosis not present

## 2022-11-10 DIAGNOSIS — H43813 Vitreous degeneration, bilateral: Secondary | ICD-10-CM | POA: Diagnosis not present

## 2022-11-10 DIAGNOSIS — H2513 Age-related nuclear cataract, bilateral: Secondary | ICD-10-CM | POA: Diagnosis not present

## 2022-11-10 DIAGNOSIS — H04123 Dry eye syndrome of bilateral lacrimal glands: Secondary | ICD-10-CM | POA: Diagnosis not present

## 2022-11-11 DIAGNOSIS — M7541 Impingement syndrome of right shoulder: Secondary | ICD-10-CM | POA: Diagnosis not present

## 2022-11-13 ENCOUNTER — Encounter: Payer: Self-pay | Admitting: Family Medicine

## 2022-11-13 ENCOUNTER — Ambulatory Visit: Payer: Medicare Other | Admitting: Family Medicine

## 2022-11-13 VITALS — BP 120/62 | HR 78 | Temp 98.2°F | Ht 64.37 in | Wt 135.4 lb

## 2022-11-13 DIAGNOSIS — Z0001 Encounter for general adult medical examination with abnormal findings: Secondary | ICD-10-CM

## 2022-11-13 DIAGNOSIS — M8580 Other specified disorders of bone density and structure, unspecified site: Secondary | ICD-10-CM

## 2022-11-13 DIAGNOSIS — Z780 Asymptomatic menopausal state: Secondary | ICD-10-CM | POA: Diagnosis not present

## 2022-11-13 DIAGNOSIS — Z0000 Encounter for general adult medical examination without abnormal findings: Secondary | ICD-10-CM

## 2022-11-13 DIAGNOSIS — J329 Chronic sinusitis, unspecified: Secondary | ICD-10-CM | POA: Diagnosis not present

## 2022-11-13 DIAGNOSIS — Z23 Encounter for immunization: Secondary | ICD-10-CM

## 2022-11-13 DIAGNOSIS — M7541 Impingement syndrome of right shoulder: Secondary | ICD-10-CM | POA: Diagnosis not present

## 2022-11-13 DIAGNOSIS — R194 Change in bowel habit: Secondary | ICD-10-CM | POA: Insufficient documentation

## 2022-11-13 MED ORDER — FEXOFENADINE HCL 180 MG PO TABS: 180.0000 mg | ORAL_TABLET | Freq: Every day | ORAL | 11 refills | Status: DC

## 2022-11-13 MED ORDER — ROSUVASTATIN CALCIUM 10 MG PO TABS: 10.0000 mg | ORAL_TABLET | Freq: Every day | ORAL | 3 refills | Status: DC

## 2022-11-13 MED ORDER — MONTELUKAST SODIUM 10 MG PO TABS: 10.0000 mg | ORAL_TABLET | Freq: Every day | ORAL | 11 refills | Status: DC

## 2022-11-13 NOTE — Progress Notes (Signed)
Subjective:    Patient ID: Brittany House, female    DOB: 07-01-1950, 72 y.o.   MRN: [id number]  HPI Patient is a very pleasant 72 year old Caucasian female here today for complete physical exam.  She sees her gynecologist and performs her mammograms at her gynecologist office.  She has that scheduled.  Has been 2 years since her bone density and she would like to schedule that now.  Her colonoscopy is not due until 2026.  She is due for a flu shot today.  She has not yet received COVID vaccination or the RSV vaccination.  We discussed these.  The remainder of her vaccinations are up-to-date.  Her most recent lab work is listed below Lab on 11/06/2022  Component Date Value Ref Range Status   WBC 11/06/2022 4.5  3.8 - 10.8 Thousand/uL Final   RBC 11/06/2022 4.67  3.80 - 5.10 Million/uL Final   Hemoglobin 11/06/2022 14.8  11.7 - 15.5 g/dL Final   HCT 78/29/5621 45.3 (H)  35.0 - 45.0 % Final   MCV 11/06/2022 97.0  80.0 - 100.0 fL Final   MCH 11/06/2022 31.7  27.0 - 33.0 pg Final   MCHC 11/06/2022 32.7  32.0 - 36.0 g/dL Final   Comment: For adults, a slight decrease in the calculated MCHC value (in the range of 30 to 32 g/dL) is most likely not clinically significant; however, it should be interpreted with caution in correlation with other red cell parameters and the patient's clinical condition.    RDW 11/06/2022 11.8  11.0 - 15.0 % Final   Platelets 11/06/2022 210  140 - 400 Thousand/uL Final   MPV 11/06/2022 11.6  7.5 - 12.5 fL Final   Neutro Abs 11/06/2022 2,250  1,500 - 7,800 cells/uL Final   Lymphs Abs 11/06/2022 1,598  850 - 3,900 cells/uL Final   Absolute Monocytes 11/06/2022 486  200 - 950 cells/uL Final   Eosinophils Absolute 11/06/2022 117  15 - 500 cells/uL Final   Basophils Absolute 11/06/2022 50  0 - 200 cells/uL Final   Neutrophils Relative % 11/06/2022 50  % Final   Total Lymphocyte 11/06/2022 35.5  % Final   Monocytes Relative 11/06/2022 10.8  % Final   Eosinophils  Relative 11/06/2022 2.6  % Final   Basophils Relative 11/06/2022 1.1  % Final   Glucose, Bld 11/06/2022 91  65 - 99 mg/dL Final   Comment: .            Fasting reference interval .    BUN 11/06/2022 13  7 - 25 mg/dL Final   Creat 30/86/5784 0.63  0.60 - 1.00 mg/dL Final   eGFR 69/62/9528 95  > OR = 60 mL/min/1.52m2 Final   BUN/Creatinine Ratio 11/06/2022 SEE NOTE:  6 - 22 (calc) Final   Comment:    Not Reported: BUN and Creatinine are within    reference range. .    Sodium 11/06/2022 141  135 - 146 mmol/L Final   Potassium 11/06/2022 4.6  3.5 - 5.3 mmol/L Final   Chloride 11/06/2022 106  98 - 110 mmol/L Final   CO2 11/06/2022 24  20 - 32 mmol/L Final   Calcium 11/06/2022 9.1  8.6 - 10.4 mg/dL Final   Total Protein 41/32/4401 6.3  6.1 - 8.1 g/dL Final   Albumin 02/72/5366 4.0  3.6 - 5.1 g/dL Final   Globulin 44/04/4740 2.3  1.9 - 3.7 g/dL (calc) Final   AG Ratio 11/06/2022 1.7  1.0 - 2.5 (calc) Final  Total Bilirubin 11/06/2022 0.8  0.2 - 1.2 mg/dL Final   Alkaline phosphatase (APISO) 11/06/2022 56  37 - 153 U/L Final   AST 11/06/2022 26  10 - 35 U/L Final   ALT 11/06/2022 19  6 - 29 U/L Final   Cholesterol 11/06/2022 144  <200 mg/dL Final   HDL 30/86/5784 60  > OR = 50 mg/dL Final   Triglycerides 69/62/9528 138  <150 mg/dL Final   LDL Cholesterol (Calc) 11/06/2022 62  mg/dL (calc) Final   Comment: Reference range: <100 . Desirable range <100 mg/dL for primary prevention;   <70 mg/dL for patients with CHD or diabetic patients  with > or = 2 CHD risk factors. Marland Kitchen LDL-C is now calculated using the Martin-Hopkins  calculation, which is a validated novel method providing  better accuracy than the Friedewald equation in the  estimation of LDL-C.  Horald Pollen et al. Lenox Ahr. 4132;440(10): 2061-2068  (http://education.[facility].com/faq/FAQ164)    Total CHOL/HDL Ratio 11/06/2022 2.4  <2.7 (calc) Final   Non-HDL Cholesterol (Calc) 11/06/2022 84  <130 mg/dL (calc) Final    Comment: For patients with diabetes plus 1 major ASCVD risk  factor, treating to a non-HDL-C goal of <100 mg/dL  (LDL-C of <25 mg/dL) is considered a therapeutic  option.    Vit D, 25-Hydroxy 11/06/2022 61  30 - 100 ng/mL Final   Comment: Vitamin D Status         25-OH Vitamin D: . Deficiency:                    <20 ng/mL Insufficiency:             20 - 29 ng/mL Optimal:                 > or = 30 ng/mL . For 25-OH Vitamin D testing on patients on  D2-supplementation and patients for whom quantitation  of D2 and D3 fractions is required, the [facility](TM) 25-OH VIT D, (D2,D3), LC/MS/MS is recommended: order  code 36644 (patients >46yrs). . See Note 1 . Note 1 . For additional information, please refer to  http://education.[facility].com/faq/FAQ199  (This link is being provided for informational/ educational purposes only.)    Of note, she has been dealing with osteoarthritis in the left knee over the medial compartment.  She is seeing orthopedics.  She is receiving viscosupplementation injections and physical therapy.  She also recently injured her right shoulder.  They are performing physical therapy for this as well. Immunization History  Administered Date(s) Administered   Fluad Quad(high Dose 65+) 10/14/2018, 11/03/2019, 12/06/2020   Influenza Split 12/18/2011   Influenza,inj,Quad PF,6+ Mos 11/16/2012, 12/02/2013, 12/26/2014, 11/29/2015, 11/12/2016, 11/30/2017   Influenza-Unspecified 11/03/2016   PFIZER(Purple Top)SARS-COV-2 Vaccination 03/12/2019, 04/06/2019, 06/23/2020   Pneumococcal Conjugate-13 09/28/2017   Pneumococcal Polysaccharide-23 09/14/2018   Tdap 09/15/2011, 07/20/2020   Zoster Recombinant(Shingrix) 02/23/2018   Zoster, Live 07/26/2012       Past Medical History:  Diagnosis Date   Cancer (HCC)    breast   GERD (gastroesophageal reflux disease)    Hyperlipidemia    Osteopenia    Osteoporosis    Past Surgical History:  Procedure Laterality  Date   BREAST SURGERY N/A    Phreesia 09/18/2019   c-sections x 3     HERNIA REPAIR     MASTECTOMY     Current Outpatient Medications on File Prior to Visit  Medication Sig Dispense Refill   Azelastine-Fluticasone 137-50 MCG/ACT SUSP Place 1 spray into the nose  every 12 (twelve) hours. 23 g 1   b complex vitamins capsule Take 1 capsule by mouth daily.     CALCIUM 600 1500 (600 Ca) MG TABS tablet SMARTSIG:1 By Mouth     D 1000 25 MCG (1000 UT) capsule SMARTSIG:1 By Mouth     doxycycline (VIBRA-TABS) 100 MG tablet Take 1 tablet (100 mg total) by mouth 2 (two) times daily. 14 tablet 0   ibuprofen (ADVIL) 800 MG tablet Take 1 tablet (800 mg total) by mouth every 8 (eight) hours as needed. 60 tablet 2   metroNIDAZOLE (METROCREAM) 0.75 % cream Apply 1 Application topically 2 (two) times daily.     Multiple Vitamin (MULTIVITAMIN) tablet Take 1 tablet by mouth daily.     Polyethyl Glycol-Propyl Glycol (SYSTANE OP) Apply to eye as needed. Eye drops     No current facility-administered medications on file prior to visit.   Allergies  Allergen Reactions   Penicillins Swelling and Rash    Has patient had a PCN reaction causing immediate rash, facial/tongue/throat swelling, SOB or lightheadedness with hypotension: Yes Has patient had a PCN reaction causing severe rash involving mucus membranes or skin necrosis: Yes Has patient had a PCN reaction that required hospitalization: No Has patient had a PCN reaction occurring within the last 10 years: No If all of the above answers are "NO", then may proceed with Cephalosporin use.    Social History   Socioeconomic History   Marital status: Married    Spouse name: Not on file   Number of children: Not on file   Years of education: Not on file   Highest education level: Not on file  Occupational History   Not on file  Tobacco Use   Smoking status: Never    Passive exposure: Never   Smokeless tobacco: Never  Vaping Use   Vaping status: Never  Used  Substance and Sexual Activity   Alcohol use: Yes    Comment: Rare   Drug use: No   Sexual activity: Yes    Comment: married to Casimiro Needle.  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  Other Topics Concern   Not on file  Social History Narrative   Not on file   Social Determinants of Health   Financial Resource Strain: Not on file  Food Insecurity: Not on file  Transportation Needs: Not on file  Physical Activity: Not on file  Stress: Not on file  Social Connections: Not on file  Intimate Partner Violence: Not on file   Family History  Problem Relation Age of Onset   Food Allergy Daughter    Allergic rhinitis Son    Heart disease Neg Hx    Cancer Neg Hx       Review of Systems     Objective:   Physical Exam Vitals reviewed.  Constitutional:      General: She is not in acute distress.    Appearance: Normal appearance. She is normal weight. She is not diaphoretic.  HENT:     Head: Normocephalic.     Right Ear: Tympanic membrane, ear canal and external ear normal.     Left Ear: Tympanic membrane, ear canal and external ear normal.     Nose: Mucosal edema and rhinorrhea present.     Right Nostril: Epistaxis present.     Left Nostril: Epistaxis present.     Left Sinus: Maxillary sinus tenderness and frontal sinus tenderness present.     Mouth/Throat:     Mouth: Mucous membranes are moist.  Pharynx: No oropharyngeal exudate or posterior oropharyngeal erythema.  Eyes:     General: No scleral icterus.       Right eye: No discharge.        Left eye: No discharge.     Conjunctiva/sclera: Conjunctivae normal.     Pupils: Pupils are equal, round, and reactive to light.  Neck:     Vascular: No carotid bruit.  Cardiovascular:     Rate and Rhythm: Normal rate and regular rhythm.     Pulses: Normal pulses.     Heart sounds: Normal heart sounds. No murmur heard.    No friction rub. No gallop.  Pulmonary:     Effort: Pulmonary effort is normal. No respiratory distress.     Breath sounds:  Normal breath sounds. No stridor. No wheezing or rales.  Chest:     Chest wall: No tenderness.  Abdominal:     General: Bowel sounds are normal. There is no distension.     Palpations: Abdomen is soft. There is no mass.     Tenderness: There is no abdominal tenderness. There is no guarding or rebound.  Musculoskeletal:        General: No tenderness. Normal range of motion.     Cervical back: Normal range of motion and neck supple. No rigidity or tenderness.     Right lower leg: No edema.     Left lower leg: No edema.  Lymphadenopathy:     Cervical: No cervical adenopathy.  Skin:    General: Skin is warm.     Coloration: Skin is not jaundiced or pale.     Findings: No bruising, erythema, lesion or rash.  Neurological:     General: No focal deficit present.     Mental Status: She is alert and oriented to person, place, and time. Mental status is at baseline.     Cranial Nerves: No cranial nerve deficit.     Sensory: No sensory deficit.     Motor: No weakness.     Coordination: Coordination normal.     Gait: Gait normal.  Psychiatric:        Behavior: Behavior normal.        Thought Content: Thought content normal.        Judgment: Judgment normal.           Assessment & Plan:  Postmenopausal estrogen deficiency - Plan: DG Bone Density  General medical exam  Osteopenia, unspecified location  Chronic sinusitis, unspecified location Patient's lab work is outstanding and her blood pressure is excellent.  She received her flu shot today.  I recommended an RSV vaccine and a COVID booster through her pharmacy.  She is scheduling her mammogram through her gynecologist.  I will schedule her for a bone density test.  Colonoscopy is up-to-date.  The patient to [facility] from [facility] for her chronic sinusitis.  Continue Singulair and Flonase.  The remainder of her preventative care is up-to-date.  Regular anticipatory guidance is provided.

## 2022-11-13 NOTE — Addendum Note (Signed)
Addended by: Venia Carbon K on: 11/13/2022 04:29 PM   Modules accepted: Orders

## 2022-11-14 ENCOUNTER — Encounter: Payer: Self-pay | Admitting: Family Medicine

## 2022-11-17 DIAGNOSIS — M1712 Unilateral primary osteoarthritis, left knee: Secondary | ICD-10-CM | POA: Diagnosis not present

## 2022-11-18 DIAGNOSIS — M7541 Impingement syndrome of right shoulder: Secondary | ICD-10-CM | POA: Diagnosis not present

## 2022-11-20 ENCOUNTER — Encounter: Payer: Self-pay | Admitting: Family Medicine

## 2022-11-24 DIAGNOSIS — M1712 Unilateral primary osteoarthritis, left knee: Secondary | ICD-10-CM | POA: Diagnosis not present

## 2022-11-26 DIAGNOSIS — M7541 Impingement syndrome of right shoulder: Secondary | ICD-10-CM | POA: Diagnosis not present

## 2022-11-27 ENCOUNTER — Encounter: Payer: Self-pay | Admitting: Family Medicine

## 2022-11-27 ENCOUNTER — Other Ambulatory Visit: Payer: Self-pay | Admitting: Family Medicine

## 2022-11-27 MED ORDER — HYDROCODONE-ACETAMINOPHEN 5-325 MG PO TABS: 1.0000 | ORAL_TABLET | Freq: Four times a day (QID) | ORAL | 0 refills | Status: DC | PRN

## 2022-11-27 MED ORDER — TAMSULOSIN HCL 0.4 MG PO CAPS: 0.4000 mg | ORAL_CAPSULE | Freq: Every day | ORAL | 0 refills | Status: DC

## 2022-11-28 DIAGNOSIS — M7541 Impingement syndrome of right shoulder: Secondary | ICD-10-CM | POA: Diagnosis not present

## 2022-12-02 DIAGNOSIS — M7541 Impingement syndrome of right shoulder: Secondary | ICD-10-CM | POA: Diagnosis not present

## 2022-12-04 DIAGNOSIS — M7541 Impingement syndrome of right shoulder: Secondary | ICD-10-CM | POA: Diagnosis not present

## 2022-12-09 ENCOUNTER — Encounter: Payer: Self-pay | Admitting: Family Medicine

## 2022-12-09 DIAGNOSIS — M7541 Impingement syndrome of right shoulder: Secondary | ICD-10-CM | POA: Diagnosis not present

## 2022-12-11 DIAGNOSIS — M7541 Impingement syndrome of right shoulder: Secondary | ICD-10-CM | POA: Diagnosis not present

## 2022-12-16 DIAGNOSIS — M7541 Impingement syndrome of right shoulder: Secondary | ICD-10-CM | POA: Diagnosis not present

## 2022-12-18 DIAGNOSIS — M7541 Impingement syndrome of right shoulder: Secondary | ICD-10-CM | POA: Diagnosis not present

## 2022-12-22 DIAGNOSIS — M25511 Pain in right shoulder: Secondary | ICD-10-CM | POA: Diagnosis not present

## 2022-12-25 ENCOUNTER — Other Ambulatory Visit: Payer: Self-pay | Admitting: Family Medicine

## 2022-12-26 DIAGNOSIS — Z1231 Encounter for screening mammogram for malignant neoplasm of breast: Secondary | ICD-10-CM | POA: Diagnosis not present

## 2022-12-26 DIAGNOSIS — Z853 Personal history of malignant neoplasm of breast: Secondary | ICD-10-CM | POA: Diagnosis not present

## 2022-12-26 LAB — HM MAMMOGRAPHY

## 2022-12-26 NOTE — Telephone Encounter (Signed)
Requested medication (s) are due for refill today- unsure  Requested medication (s) are on the active medication list -yes  Future visit scheduled -yes- 10 months  Last refill: 11/27/22   Notes to clinic: Rx for acute problem- does she need f/u?  Requested Prescriptions  Pending Prescriptions Disp Refills   tamsulosin (FLOMAX) 0.4 MG CAPS capsule [Pharmacy Med Name: TAMSULOSIN HCL 0.4 MG CAPSULE] 30 capsule 0    Sig: TAKE 1 CAPSULE BY MOUTH EVERY DAY     Urology: Alpha-Adrenergic Blocker Failed - 12/25/2022  1:32 AM      Failed - PSA in normal range and within 360 days    No results found for: "LABPSA", "PSA", "PSA1", "ULTRAPSA"       Failed - Valid encounter within last 12 months    Recent Outpatient Visits           1 year ago Dysfunction of both eustachian tubes   [facility] Pickard, Priscille Heidelberg, MD   1 year ago Post-concussion headache   [facility] Tanya Nones, Priscille Heidelberg, MD   2 years ago Need for immunization against influenza   [facility] Donita Brooks, MD   2 years ago Seasonal allergic rhinitis due to pollen   [facility] Pickard, Priscille Heidelberg, MD   2 years ago Osteopenia, unspecified location   [facility] Pickard, Priscille Heidelberg, MD       Future Appointments             In 10 months Pickard, Priscille Heidelberg, MD [facility]            Passed - Last BP in normal range    BP Readings from Last 1 Encounters:  11/13/22 120/62            Requested Prescriptions  Pending Prescriptions Disp Refills   tamsulosin (FLOMAX) 0.4 MG CAPS capsule [Pharmacy Med Name: TAMSULOSIN HCL 0.4 MG CAPSULE] 30 capsule 0    Sig: TAKE 1 CAPSULE BY MOUTH EVERY DAY     Urology: Alpha-Adrenergic Blocker Failed - 12/25/2022  1:32 AM      Failed - PSA in normal range and within 360 days    No results found for: "LABPSA", "PSA", "PSA1", "ULTRAPSA"       Failed - Valid  encounter within last 12 months    Recent Outpatient Visits           1 year ago Dysfunction of both eustachian tubes   [facility] Donita Brooks, MD   1 year ago Post-concussion headache   [facility] Tanya Nones, Priscille Heidelberg, MD   2 years ago Need for immunization against influenza   [facility] Donita Brooks, MD   2 years ago Seasonal allergic rhinitis due to pollen   [facility] Pickard, Priscille Heidelberg, MD   2 years ago Osteopenia, unspecified location   [facility] Pickard, Priscille Heidelberg, MD       Future Appointments             In 10 months Pickard, Priscille Heidelberg, MD [facility]            Passed - Last BP in normal range    BP Readings from Last 1 Encounters:  11/13/22 120/62

## 2022-12-29 ENCOUNTER — Other Ambulatory Visit: Payer: Self-pay | Admitting: Orthopaedic Surgery

## 2022-12-29 DIAGNOSIS — G8929 Other chronic pain: Secondary | ICD-10-CM

## 2022-12-30 ENCOUNTER — Encounter: Payer: Self-pay | Admitting: Obstetrics and Gynecology

## 2022-12-31 DIAGNOSIS — M8588 Other specified disorders of bone density and structure, other site: Secondary | ICD-10-CM | POA: Diagnosis not present

## 2022-12-31 DIAGNOSIS — Z853 Personal history of malignant neoplasm of breast: Secondary | ICD-10-CM | POA: Diagnosis not present

## 2022-12-31 LAB — HM DEXA SCAN

## 2023-01-05 ENCOUNTER — Ambulatory Visit
Admission: RE | Admit: 2023-01-05 | Discharge: 2023-01-05 | Disposition: A | Discharge status: Home / Self Care | Payer: Medicare Other | Source: Ambulatory Visit | Attending: Orthopaedic Surgery | Admitting: Orthopaedic Surgery

## 2023-01-05 DIAGNOSIS — M25511 Pain in right shoulder: Secondary | ICD-10-CM | POA: Diagnosis not present

## 2023-01-05 DIAGNOSIS — M75121 Complete rotator cuff tear or rupture of right shoulder, not specified as traumatic: Secondary | ICD-10-CM | POA: Diagnosis not present

## 2023-01-05 DIAGNOSIS — G8929 Other chronic pain: Secondary | ICD-10-CM

## 2023-01-05 DIAGNOSIS — M19011 Primary osteoarthritis, right shoulder: Secondary | ICD-10-CM | POA: Diagnosis not present

## 2023-01-06 ENCOUNTER — Encounter: Payer: Self-pay | Admitting: Family Medicine

## 2023-01-12 DIAGNOSIS — M25511 Pain in right shoulder: Secondary | ICD-10-CM | POA: Diagnosis not present

## 2023-01-30 ENCOUNTER — Encounter: Payer: Self-pay | Admitting: Family Medicine

## 2023-01-30 ENCOUNTER — Ambulatory Visit (INDEPENDENT_AMBULATORY_CARE_PROVIDER_SITE_OTHER): Payer: Medicare Other | Admitting: Family Medicine

## 2023-01-30 VITALS — BP 120/80 | HR 64 | Temp 98.2°F | Ht 64.37 in | Wt 129.5 lb

## 2023-01-30 DIAGNOSIS — M810 Age-related osteoporosis without current pathological fracture: Secondary | ICD-10-CM

## 2023-01-30 DIAGNOSIS — M75121 Complete rotator cuff tear or rupture of right shoulder, not specified as traumatic: Secondary | ICD-10-CM | POA: Diagnosis not present

## 2023-01-30 NOTE — Progress Notes (Signed)
Subjective:    Patient ID: Brittany House, female    DOB: 31-Oct-1950, 72 y.o.   MRN: [id number]  HPI Patient is here today to discuss 2 problems.  She is having pain in her right shoulder.  She recently had an MRI that showed a complete tear of the supraspinatus tendon with retraction.  There was also a partial tear in the labrum.  She is questioning whether she needs surgery.  She has excellent range of motion but she does have pain with range of motion greater than 90 degrees.  This is limiting her quality of life.  She also has a sharp pain in the right shoulder at times.  Second, the patient recently had a bone density test that showed osteoporosis with a T-score of -2.6 in the left hip.  She is hesitant to take Fosamax or Prolia due to osteonecrosis of the jaw.  She wants to discuss this further.    Past Medical History:  Diagnosis Date   Cancer [facility])    breast   GERD (gastroesophageal reflux disease)    Hyperlipidemia    Osteopenia    Osteoporosis    Past Surgical History:  Procedure Laterality Date   BREAST SURGERY N/A    [facility]ia 09/18/2019   c-sections x 3     HERNIA REPAIR     MASTECTOMY     Current Outpatient Medications on File Prior to Visit  Medication Sig Dispense Refill   Azelastine-Fluticasone 137-50 MCG/ACT SUSP Place 1 spray into the nose every 12 (twelve) hours. 23 g 1   b complex vitamins capsule Take 1 capsule by mouth daily.     CALCIUM 600 1500 (600 Ca) MG TABS tablet SMARTSIG:1 By Mouth     D 1000 25 MCG (1000 UT) capsule SMARTSIG:1 By Mouth     doxycycline (VIBRA-TABS) 100 MG tablet Take 1 tablet (100 mg total) by mouth 2 (two) times daily. 14 tablet 0   ibuprofen (ADVIL) 800 MG tablet Take 1 tablet (800 mg total) by mouth every 8 (eight) hours as needed. 60 tablet 2   metroNIDAZOLE (METROCREAM) 0.75 % cream Apply 1 Application topically 2 (two) times daily.     montelukast (SINGULAIR) 10 MG tablet Take 1 tablet (10 mg total) by mouth at bedtime. 30 tablet  11   Multiple Vitamin (MULTIVITAMIN) tablet Take 1 tablet by mouth daily.     Polyethyl Glycol-Propyl Glycol (SYSTANE OP) Apply to eye as needed. Eye drops     rosuvastatin (CRESTOR) 10 MG tablet Take 1 tablet (10 mg total) by mouth daily. 90 tablet 3   tamsulosin (FLOMAX) 0.4 MG CAPS capsule Take 1 capsule (0.4 mg total) by mouth daily. 30 capsule 0   fexofenadine (ALLEGRA ALLERGY) 180 MG tablet Take 1 tablet (180 mg total) by mouth daily. (Patient not taking: Reported on 01/30/2023) 30 tablet 11   HYDROcodone-acetaminophen (NORCO) 5-325 MG tablet Take 1 tablet by mouth every 6 (six) hours as needed for moderate pain (pain score 4-6). (Patient not taking: Reported on 01/30/2023) 30 tablet 0   No current facility-administered medications on file prior to visit.   Allergies  Allergen Reactions   Penicillins Swelling and Rash    Has patient had a PCN reaction causing immediate rash, facial/tongue/throat swelling, SOB or lightheadedness with hypotension: Yes Has patient had a PCN reaction causing severe rash involving mucus membranes or skin necrosis: Yes Has patient had a PCN reaction that required hospitalization: No Has patient had a PCN reaction occurring within the  last 10 years: No If all of the above answers are "NO", then may proceed with Cephalosporin use.    Social History   Socioeconomic History   Marital status: Married    Spouse name: Not on file   Number of children: Not on file   Years of education: Not on file   Highest education level: Not on file  Occupational History   Not on file  Tobacco Use   Smoking status: Never    Passive exposure: Never   Smokeless tobacco: Never  Vaping Use   Vaping status: Never Used  Substance and Sexual Activity   Alcohol use: Yes    Comment: Rare   Drug use: No   Sexual activity: Yes    Comment: married to Brittany House.  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  Other Topics Concern   Not on file  Social History Narrative   Not on file   Social Drivers of  Health   Financial Resource Strain: Not on file  Food Insecurity: Not on file  Transportation Needs: Not on file  Physical Activity: Not on file  Stress: Not on file  Social Connections: Not on file  Intimate Partner Violence: Not on file   Family History  Problem Relation Age of Onset   Food Allergy Daughter    Allergic rhinitis Son    Heart disease Neg Hx    Cancer Neg Hx       Review of Systems     Objective:   Physical Exam Vitals reviewed.  Constitutional:      General: She is not in acute distress.    Appearance: Normal appearance. She is normal weight. She is not diaphoretic.  HENT:     Head: Normocephalic.     Nose: Mucosal edema present.     Right Nostril: Epistaxis present.     Left Nostril: Epistaxis present.     Left Sinus: Maxillary sinus tenderness and frontal sinus tenderness present.  Eyes:     General: No scleral icterus. Cardiovascular:     Rate and Rhythm: Normal rate and regular rhythm.     Pulses: Normal pulses.     Heart sounds: Normal heart sounds. No murmur heard.    No friction rub. No gallop.  Pulmonary:     Effort: Pulmonary effort is normal. No respiratory distress.     Breath sounds: Normal breath sounds. No stridor. No wheezing or rales.  Chest:     Chest wall: No tenderness.  Musculoskeletal:        General: Tenderness present.     Right shoulder: Tenderness present. Decreased range of motion. Decreased strength.  Neurological:     Mental Status: She is alert.           Assessment & Plan:  Nontraumatic complete tear of right rotator cuff  Osteoporosis without current pathological fracture, unspecified osteoporosis type I recommended Fosamax or Prolia.  The patient would like to try dietary supplements and exercise and recheck a bone density test in 2 years.  I feel that this is reasonable.  I also recommended that she proceed with surgery.  The patient is very healthy and in great shape.  I believe that the pain caused by  the labral tear in the supraspinatus tear will limit her quality of life.  She has an appointment to meet with the orthopedist to discuss this further.  She has tried and failed physical therapy.

## 2023-02-11 DIAGNOSIS — N200 Calculus of kidney: Secondary | ICD-10-CM | POA: Diagnosis not present

## 2023-02-13 DIAGNOSIS — M25611 Stiffness of right shoulder, not elsewhere classified: Secondary | ICD-10-CM | POA: Diagnosis not present

## 2023-03-12 DIAGNOSIS — G8918 Other acute postprocedural pain: Secondary | ICD-10-CM | POA: Diagnosis not present

## 2023-03-12 DIAGNOSIS — M75121 Complete rotator cuff tear or rupture of right shoulder, not specified as traumatic: Secondary | ICD-10-CM | POA: Diagnosis not present

## 2023-03-24 DIAGNOSIS — M25611 Stiffness of right shoulder, not elsewhere classified: Secondary | ICD-10-CM | POA: Diagnosis not present

## 2023-03-24 DIAGNOSIS — M75121 Complete rotator cuff tear or rupture of right shoulder, not specified as traumatic: Secondary | ICD-10-CM | POA: Diagnosis not present

## 2023-03-31 DIAGNOSIS — M25611 Stiffness of right shoulder, not elsewhere classified: Secondary | ICD-10-CM | POA: Diagnosis not present

## 2023-03-31 DIAGNOSIS — M75121 Complete rotator cuff tear or rupture of right shoulder, not specified as traumatic: Secondary | ICD-10-CM | POA: Diagnosis not present

## 2023-04-02 DIAGNOSIS — M25611 Stiffness of right shoulder, not elsewhere classified: Secondary | ICD-10-CM | POA: Diagnosis not present

## 2023-04-02 DIAGNOSIS — M75121 Complete rotator cuff tear or rupture of right shoulder, not specified as traumatic: Secondary | ICD-10-CM | POA: Diagnosis not present

## 2023-04-06 DIAGNOSIS — M75121 Complete rotator cuff tear or rupture of right shoulder, not specified as traumatic: Secondary | ICD-10-CM | POA: Diagnosis not present

## 2023-04-06 DIAGNOSIS — M25611 Stiffness of right shoulder, not elsewhere classified: Secondary | ICD-10-CM | POA: Diagnosis not present

## 2023-04-09 DIAGNOSIS — M25611 Stiffness of right shoulder, not elsewhere classified: Secondary | ICD-10-CM | POA: Diagnosis not present

## 2023-04-09 DIAGNOSIS — M75121 Complete rotator cuff tear or rupture of right shoulder, not specified as traumatic: Secondary | ICD-10-CM | POA: Diagnosis not present

## 2023-04-13 DIAGNOSIS — M25611 Stiffness of right shoulder, not elsewhere classified: Secondary | ICD-10-CM | POA: Diagnosis not present

## 2023-04-13 DIAGNOSIS — M75121 Complete rotator cuff tear or rupture of right shoulder, not specified as traumatic: Secondary | ICD-10-CM | POA: Diagnosis not present

## 2023-04-16 DIAGNOSIS — M25611 Stiffness of right shoulder, not elsewhere classified: Secondary | ICD-10-CM | POA: Diagnosis not present

## 2023-04-16 DIAGNOSIS — M75121 Complete rotator cuff tear or rupture of right shoulder, not specified as traumatic: Secondary | ICD-10-CM | POA: Diagnosis not present

## 2023-04-19 DIAGNOSIS — S83242A Other tear of medial meniscus, current injury, left knee, initial encounter: Secondary | ICD-10-CM | POA: Diagnosis not present

## 2023-04-20 DIAGNOSIS — M75121 Complete rotator cuff tear or rupture of right shoulder, not specified as traumatic: Secondary | ICD-10-CM | POA: Diagnosis not present

## 2023-04-20 DIAGNOSIS — M25611 Stiffness of right shoulder, not elsewhere classified: Secondary | ICD-10-CM | POA: Diagnosis not present

## 2023-04-21 ENCOUNTER — Encounter: Payer: Self-pay | Admitting: Family Medicine

## 2023-04-21 ENCOUNTER — Other Ambulatory Visit: Payer: Self-pay | Admitting: Family Medicine

## 2023-04-21 MED ORDER — IBUPROFEN 800 MG PO TABS: 800.0000 mg | ORAL_TABLET | Freq: Three times a day (TID) | ORAL | 2 refills | Status: AC | PRN

## 2023-04-22 DIAGNOSIS — M75121 Complete rotator cuff tear or rupture of right shoulder, not specified as traumatic: Secondary | ICD-10-CM | POA: Diagnosis not present

## 2023-04-22 DIAGNOSIS — M1712 Unilateral primary osteoarthritis, left knee: Secondary | ICD-10-CM | POA: Diagnosis not present

## 2023-04-22 DIAGNOSIS — M25611 Stiffness of right shoulder, not elsewhere classified: Secondary | ICD-10-CM | POA: Diagnosis not present

## 2023-04-27 DIAGNOSIS — M75121 Complete rotator cuff tear or rupture of right shoulder, not specified as traumatic: Secondary | ICD-10-CM | POA: Diagnosis not present

## 2023-04-27 DIAGNOSIS — M25611 Stiffness of right shoulder, not elsewhere classified: Secondary | ICD-10-CM | POA: Diagnosis not present

## 2023-04-29 DIAGNOSIS — M25611 Stiffness of right shoulder, not elsewhere classified: Secondary | ICD-10-CM | POA: Diagnosis not present

## 2023-04-29 DIAGNOSIS — M75121 Complete rotator cuff tear or rupture of right shoulder, not specified as traumatic: Secondary | ICD-10-CM | POA: Diagnosis not present

## 2023-05-04 DIAGNOSIS — M25611 Stiffness of right shoulder, not elsewhere classified: Secondary | ICD-10-CM | POA: Diagnosis not present

## 2023-05-04 DIAGNOSIS — M75121 Complete rotator cuff tear or rupture of right shoulder, not specified as traumatic: Secondary | ICD-10-CM | POA: Diagnosis not present

## 2023-05-06 DIAGNOSIS — M75121 Complete rotator cuff tear or rupture of right shoulder, not specified as traumatic: Secondary | ICD-10-CM | POA: Diagnosis not present

## 2023-05-06 DIAGNOSIS — M25611 Stiffness of right shoulder, not elsewhere classified: Secondary | ICD-10-CM | POA: Diagnosis not present

## 2023-05-11 DIAGNOSIS — M75121 Complete rotator cuff tear or rupture of right shoulder, not specified as traumatic: Secondary | ICD-10-CM | POA: Diagnosis not present

## 2023-05-11 DIAGNOSIS — M25611 Stiffness of right shoulder, not elsewhere classified: Secondary | ICD-10-CM | POA: Diagnosis not present

## 2023-05-13 DIAGNOSIS — M75121 Complete rotator cuff tear or rupture of right shoulder, not specified as traumatic: Secondary | ICD-10-CM | POA: Diagnosis not present

## 2023-05-13 DIAGNOSIS — M25611 Stiffness of right shoulder, not elsewhere classified: Secondary | ICD-10-CM | POA: Diagnosis not present

## 2023-05-18 DIAGNOSIS — M75121 Complete rotator cuff tear or rupture of right shoulder, not specified as traumatic: Secondary | ICD-10-CM | POA: Diagnosis not present

## 2023-05-18 DIAGNOSIS — M25611 Stiffness of right shoulder, not elsewhere classified: Secondary | ICD-10-CM | POA: Diagnosis not present

## 2023-05-20 ENCOUNTER — Encounter: Payer: Self-pay | Admitting: Family Medicine

## 2023-05-20 ENCOUNTER — Ambulatory Visit: Admitting: Family Medicine

## 2023-05-20 VITALS — BP 118/64 | HR 66 | Temp 98.5°F | Ht 64.37 in | Wt 136.0 lb

## 2023-05-20 DIAGNOSIS — M75121 Complete rotator cuff tear or rupture of right shoulder, not specified as traumatic: Secondary | ICD-10-CM | POA: Diagnosis not present

## 2023-05-20 DIAGNOSIS — M25611 Stiffness of right shoulder, not elsewhere classified: Secondary | ICD-10-CM | POA: Diagnosis not present

## 2023-05-20 DIAGNOSIS — N390 Urinary tract infection, site not specified: Secondary | ICD-10-CM

## 2023-05-20 DIAGNOSIS — R300 Dysuria: Secondary | ICD-10-CM

## 2023-05-20 LAB — URINALYSIS, ROUTINE W REFLEX MICROSCOPIC
Bilirubin Urine: NEGATIVE
Glucose, UA: NEGATIVE
Hyaline Cast: NONE SEEN /LPF
Ketones, ur: NEGATIVE
Nitrite: NEGATIVE
Protein, ur: NEGATIVE
Specific Gravity, Urine: 1.02 (ref 1.001–1.035)
pH: 7 (ref 5.0–8.0)

## 2023-05-20 LAB — MICROSCOPIC MESSAGE

## 2023-05-20 MED ORDER — NITROFURANTOIN MONOHYD MACRO 100 MG PO CAPS: 100.0000 mg | ORAL_CAPSULE | Freq: Two times a day (BID) | ORAL | 0 refills | Status: DC

## 2023-05-20 MED ORDER — PHENAZOPYRIDINE HCL 100 MG PO TABS: 100.0000 mg | ORAL_TABLET | Freq: Three times a day (TID) | ORAL | 0 refills | Status: DC | PRN

## 2023-05-20 NOTE — Progress Notes (Signed)
 Patient Office Visit  Assessment & Plan:  Dysuria -     Urinalysis, Routine w reflex microscopic -     Urine Culture; Future -     Phenazopyridine HCl; Take 1 tablet (100 mg total) by mouth 3 (three) times daily as needed for pain (burning).  Dispense: 21 tablet; Refill: 0  Urinary tract infection without hematuria, site unspecified -     Urine Culture; Future -     Phenazopyridine HCl; Take 1 tablet (100 mg total) by mouth 3 (three) times daily as needed for pain (burning).  Dispense: 21 tablet; Refill: 0  Other orders -     Nitrofurantoin Monohyd Macro; Take 1 capsule (100 mg total) by mouth 2 (two) times daily for 7 days.  Dispense: 14 capsule; Refill: 0   Follow-up on urine culture result and notify patient.  Patient would rather start antibiotic right now in case symptoms worsen.  Increase water intake.  Pyridium use as needed for discomfort/dysuria.  No follow-ups on file.   Subjective:    Patient ID: Brittany House, female    DOB: 03/05/1950  Age: 73 y.o. MRN: [id number]  Chief Complaint  Patient presents with   Urinary Frequency    X 2 days. Now has noticed burning and pain in the center of her abdomen.     Urinary Frequency  Associated symptoms include frequency.  Possible UTI-patient has had 2-day history of burning, frequency, nocturia, hesitancy at times and discomfort with urination.  No fever chills nausea vomiting or back pain or gross hematuria.  Not having urgency at this time. Patient does have a previous history of kidney stones, last one being 2 years ago.  Patient knows what that feels like.  Patient does not have a previous history of UTIs.  Patient is having some suprapubic discomfort but not bad.  Patient thinks she drinks enough water but not sure. Patient wanted to be checked for possible infection. Pt is not going away this weekend.  Patient has not tried any OTC meds ie Azo Standard.    The 10-year ASCVD risk score (Arnett DK, et al., 2019) is:  8.2%  Past Medical History:  Diagnosis Date   Cancer (HCC)    breast   GERD (gastroesophageal reflux disease)    Hyperlipidemia    Osteopenia    Osteoporosis    Past Surgical History:  Procedure Laterality Date   BREAST SURGERY N/A    Phreesia 09/18/2019   c-sections x 3     HERNIA REPAIR     MASTECTOMY     Social History   Tobacco Use   Smoking status: Never    Passive exposure: Never   Smokeless tobacco: Never  Vaping Use   Vaping status: Never Used  Substance Use Topics   Alcohol use: Yes    Comment: Rare   Drug use: No   Family History  Problem Relation Age of Onset   Food Allergy Daughter    Allergic rhinitis Son    Heart disease Neg Hx    Cancer Neg Hx    Allergies  Allergen Reactions   Penicillins Swelling and Rash    Has patient had a PCN reaction causing immediate rash, facial/tongue/throat swelling, SOB or lightheadedness with hypotension: Yes Has patient had a PCN reaction causing severe rash involving mucus membranes or skin necrosis: Yes Has patient had a PCN reaction that required hospitalization: No Has patient had a PCN reaction occurring within the last 10 years: No If all of the  above answers are "NO", then may proceed with Cephalosporin use.     Review of Systems  Genitourinary:  Positive for frequency.      Objective:    BP 118/64   Pulse 66   Temp 98.5 F (36.9 C)   Ht 5' 4.37" (1.635 m)   Wt 136 lb (61.7 kg)   SpO2 99%   BMI 23.08 kg/m  BP Readings from Last 3 Encounters:  05/20/23 118/64  01/30/23 120/80  11/13/22 120/62   Wt Readings from Last 3 Encounters:  05/20/23 136 lb (61.7 kg)  01/30/23 129 lb 8 oz (58.7 kg)  11/13/22 135 lb 6.4 oz (61.4 kg)    Physical Exam Vitals and nursing note reviewed.  Constitutional:      Appearance: Normal appearance.  HENT:     Head: Normocephalic.     Right Ear: Tympanic membrane, ear canal and external ear normal.     Left Ear: Tympanic membrane, ear canal and external ear  normal.  Eyes:     Extraocular Movements: Extraocular movements intact.     Pupils: Pupils are equal, round, and reactive to light.  Cardiovascular:     Rate and Rhythm: Normal rate and regular rhythm.     Heart sounds: Normal heart sounds.  Pulmonary:     Effort: Pulmonary effort is normal.     Breath sounds: Normal breath sounds.  Abdominal:     General: Bowel sounds are normal.     Tenderness: There is no abdominal tenderness. There is no right CVA tenderness, left CVA tenderness, guarding or rebound.  Neurological:     General: No focal deficit present.     Mental Status: She is alert and oriented to person, place, and time.  Psychiatric:        Mood and Affect: Mood normal.      No results found for any visits on 05/20/23.

## 2023-05-21 ENCOUNTER — Encounter: Payer: Self-pay | Admitting: Family Medicine

## 2023-05-21 ENCOUNTER — Other Ambulatory Visit: Payer: Self-pay | Admitting: Family Medicine

## 2023-05-21 MED ORDER — SULFAMETHOXAZOLE-TRIMETHOPRIM 800-160 MG PO TABS: 1.0000 | ORAL_TABLET | Freq: Two times a day (BID) | ORAL | 0 refills | Status: DC

## 2023-05-23 ENCOUNTER — Encounter: Payer: Self-pay | Admitting: Family Medicine

## 2023-05-23 LAB — URINE CULTURE
MICRO NUMBER:: 16337562
SPECIMEN QUALITY:: ADEQUATE

## 2023-05-25 ENCOUNTER — Other Ambulatory Visit: Payer: Self-pay

## 2023-05-25 DIAGNOSIS — M25611 Stiffness of right shoulder, not elsewhere classified: Secondary | ICD-10-CM | POA: Diagnosis not present

## 2023-05-25 MED ORDER — CIPROFLOXACIN HCL 500 MG PO TABS
500.0000 mg | ORAL_TABLET | Freq: Two times a day (BID) | ORAL | 0 refills | Status: AC
Start: 2023-05-25 — End: 2023-06-01

## 2023-05-26 ENCOUNTER — Encounter: Payer: Self-pay | Admitting: Family Medicine

## 2023-05-29 ENCOUNTER — Encounter: Payer: Self-pay | Admitting: Family Medicine

## 2023-05-29 DIAGNOSIS — N3000 Acute cystitis without hematuria: Secondary | ICD-10-CM | POA: Diagnosis not present

## 2023-06-05 ENCOUNTER — Encounter: Payer: Self-pay | Admitting: Family Medicine

## 2023-06-05 ENCOUNTER — Ambulatory Visit (INDEPENDENT_AMBULATORY_CARE_PROVIDER_SITE_OTHER): Admitting: Family Medicine

## 2023-06-05 VITALS — BP 114/62 | HR 69 | Temp 97.5°F | Ht 64.37 in | Wt 133.0 lb

## 2023-06-05 DIAGNOSIS — K5730 Diverticulosis of large intestine without perforation or abscess without bleeding: Secondary | ICD-10-CM | POA: Insufficient documentation

## 2023-06-05 DIAGNOSIS — R062 Wheezing: Secondary | ICD-10-CM | POA: Diagnosis not present

## 2023-06-05 MED ORDER — ALBUTEROL SULFATE HFA 108 (90 BASE) MCG/ACT IN AERS: 2.0000 | INHALATION_SPRAY | Freq: Four times a day (QID) | RESPIRATORY_TRACT | 0 refills | Status: AC | PRN

## 2023-06-05 MED ORDER — PREDNISONE 20 MG PO TABS: ORAL_TABLET | ORAL | 0 refills | Status: DC

## 2023-06-05 NOTE — Progress Notes (Signed)
 Subjective:    Patient ID: Brittany House, female    DOB: 1950-12-25, 73 y.o.   MRN: [id number]  HPI  Patient has a history of chronic allergies and sinusitis.  Over the last few days, she has developed shortness of breath.  She denies any chest pain.  She denies any chest pressure.  She denies any purulent sputum.  She denies any pleurisy.  She denies any fevers or chills.  She denies any chest congestion.  There is no peripheral edema.  However she does have some faint expiratory wheezing and diminished breath sounds bilaterally.  She has a longstanding history of allergies and a remote history of asthma. Past Medical History:  Diagnosis Date   Cancer [facility])    breast   GERD (gastroesophageal reflux disease)    Hyperlipidemia    Osteopenia    Osteoporosis    Past Surgical History:  Procedure Laterality Date   BREAST SURGERY N/A    Phreesia 09/18/2019   c-sections x 3     HERNIA REPAIR     MASTECTOMY     Current Outpatient Medications on File Prior to Visit  Medication Sig Dispense Refill   Azelastine -Fluticasone  137-50 MCG/ACT SUSP Place 1 spray into the nose every 12 (twelve) hours. 23 g 1   b complex vitamins capsule Take 1 capsule by mouth daily.     CALCIUM  600 1500 (600 Ca) MG TABS tablet SMARTSIG:1 By Mouth     D 1000 25 MCG (1000 UT) capsule SMARTSIG:1 By Mouth     ibuprofen  (ADVIL ) 800 MG tablet Take 1 tablet (800 mg total) by mouth every 8 (eight) hours as needed. 60 tablet 2   metroNIDAZOLE (METROCREAM) 0.75 % cream Apply 1 Application topically 2 (two) times daily.     montelukast  (SINGULAIR ) 10 MG tablet Take 1 tablet (10 mg total) by mouth at bedtime. 30 tablet 11   Multiple Vitamin (MULTIVITAMIN) tablet Take 1 tablet by mouth daily.     phenazopyridine  (PYRIDIUM ) 100 MG tablet Take 1 tablet (100 mg total) by mouth 3 (three) times daily as needed for pain (burning). 21 tablet 0   Polyethyl Glycol-Propyl Glycol (SYSTANE OP) Apply to eye as needed. Eye drops      rosuvastatin  (CRESTOR ) 10 MG tablet Take 1 tablet (10 mg total) by mouth daily. 90 tablet 3   sulfamethoxazole -trimethoprim  (BACTRIM  DS) 800-160 MG tablet Take 1 tablet by mouth 2 (two) times daily. 14 tablet 0   No current facility-administered medications on file prior to visit.   Allergies  Allergen Reactions   Penicillins Swelling and Rash    Has patient had a PCN reaction causing immediate rash, facial/tongue/throat swelling, SOB or lightheadedness with hypotension: Yes Has patient had a PCN reaction causing severe rash involving mucus membranes or skin necrosis: Yes Has patient had a PCN reaction that required hospitalization: No Has patient had a PCN reaction occurring within the last 10 years: No If all of the above answers are "NO", then may proceed with Cephalosporin use.    Social History   Socioeconomic History   Marital status: Married    Spouse name: Not on file   Number of children: Not on file   Years of education: Not on file   Highest education level: Not on file  Occupational History   Not on file  Tobacco Use   Smoking status: Never    Passive exposure: Never   Smokeless tobacco: Never  Vaping Use   Vaping status: Never Used  Substance  and Sexual Activity   Alcohol use: Yes    Comment: Rare   Drug use: No   Sexual activity: Yes    Comment: married to Brittany House.  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  Other Topics Concern   Not on file  Social History Narrative   Not on file   Social Drivers of Health   Financial Resource Strain: Not on file  Food Insecurity: Not on file  Transportation Needs: Not on file  Physical Activity: Not on file  Stress: Not on file  Social Connections: Not on file  Intimate Partner Violence: Not on file     Review of Systems  All other systems reviewed and are negative.      Objective:   Physical Exam Vitals reviewed.  Constitutional:      Appearance: Normal appearance. She is well-developed and normal weight.  HENT:     Head:  Normocephalic and atraumatic.     Right Ear: Tympanic membrane, ear canal and external ear normal.     Left Ear: Tympanic membrane, ear canal and external ear normal.     Nose: Mucosal edema, congestion and rhinorrhea present.     Right Turbinates: Enlarged and swollen.     Left Turbinates: Enlarged and swollen.     Right Sinus: Maxillary sinus tenderness and frontal sinus tenderness present.     Mouth/Throat:     Pharynx: No oropharyngeal exudate.  Eyes:     General: Allergic shiner present.  Cardiovascular:     Rate and Rhythm: Normal rate and regular rhythm.     Heart sounds: Normal heart sounds. No murmur heard.    No friction rub. No gallop.  Pulmonary:     Effort: Pulmonary effort is normal. No respiratory distress.     Breath sounds: Wheezing present. No rhonchi or rales.  Chest:     Chest wall: No tenderness.  Abdominal:     General: Bowel sounds are normal. There is no distension.     Palpations: Abdomen is soft.     Tenderness: There is no abdominal tenderness. There is no guarding or rebound.  Musculoskeletal:     Cervical back: Neck supple.  Neurological:     Mental Status: She is alert.         Assessment & Plan:  Wheezing I believe the patient may be dealing with bronchospasms due to allergies.  She can do a prednisone  taper pack and albuterol 2 puffs every 6 hours as needed for wheezing

## 2023-06-15 ENCOUNTER — Other Ambulatory Visit: Payer: Medicare Other

## 2023-06-18 DIAGNOSIS — D1801 Hemangioma of skin and subcutaneous tissue: Secondary | ICD-10-CM | POA: Diagnosis not present

## 2023-06-18 DIAGNOSIS — L821 Other seborrheic keratosis: Secondary | ICD-10-CM | POA: Diagnosis not present

## 2023-06-18 DIAGNOSIS — D225 Melanocytic nevi of trunk: Secondary | ICD-10-CM | POA: Diagnosis not present

## 2023-06-18 DIAGNOSIS — L820 Inflamed seborrheic keratosis: Secondary | ICD-10-CM | POA: Diagnosis not present

## 2023-06-18 DIAGNOSIS — D2272 Melanocytic nevi of left lower limb, including hip: Secondary | ICD-10-CM | POA: Diagnosis not present

## 2023-06-18 DIAGNOSIS — D2262 Melanocytic nevi of left upper limb, including shoulder: Secondary | ICD-10-CM | POA: Diagnosis not present

## 2023-06-18 DIAGNOSIS — D2271 Melanocytic nevi of right lower limb, including hip: Secondary | ICD-10-CM | POA: Diagnosis not present

## 2023-06-18 DIAGNOSIS — D2261 Melanocytic nevi of right upper limb, including shoulder: Secondary | ICD-10-CM | POA: Diagnosis not present

## 2023-06-18 DIAGNOSIS — L718 Other rosacea: Secondary | ICD-10-CM | POA: Diagnosis not present

## 2023-07-08 DIAGNOSIS — M1712 Unilateral primary osteoarthritis, left knee: Secondary | ICD-10-CM | POA: Diagnosis not present

## 2023-07-15 DIAGNOSIS — M1712 Unilateral primary osteoarthritis, left knee: Secondary | ICD-10-CM | POA: Diagnosis not present

## 2023-07-15 DIAGNOSIS — M7062 Trochanteric bursitis, left hip: Secondary | ICD-10-CM | POA: Diagnosis not present

## 2023-08-24 ENCOUNTER — Encounter: Payer: Self-pay | Admitting: Family Medicine

## 2023-08-28 ENCOUNTER — Encounter: Payer: Self-pay | Admitting: Family Medicine

## 2023-08-28 ENCOUNTER — Ambulatory Visit (INDEPENDENT_AMBULATORY_CARE_PROVIDER_SITE_OTHER): Admitting: Family Medicine

## 2023-08-28 VITALS — BP 118/68 | HR 81 | Temp 98.7°F | Ht 64.3 in | Wt 134.8 lb

## 2023-08-28 DIAGNOSIS — J019 Acute sinusitis, unspecified: Secondary | ICD-10-CM | POA: Diagnosis not present

## 2023-08-28 DIAGNOSIS — B9689 Other specified bacterial agents as the cause of diseases classified elsewhere: Secondary | ICD-10-CM

## 2023-08-28 MED ORDER — MONTELUKAST SODIUM 10 MG PO TABS: 10.0000 mg | ORAL_TABLET | Freq: Every day | ORAL | 11 refills | Status: AC

## 2023-08-28 MED ORDER — AZELASTINE-FLUTICASONE 137-50 MCG/ACT NA SUSP: 1.0000 | Freq: Two times a day (BID) | NASAL | 1 refills | Status: DC

## 2023-08-28 MED ORDER — AZITHROMYCIN 250 MG PO TABS: ORAL_TABLET | ORAL | 0 refills | Status: DC

## 2023-08-28 NOTE — Progress Notes (Signed)
 Subjective:    Patient ID: Brittany House, female    DOB: 1951/01/26, 73 y.o.   MRN: [id number]  HPI  Patient reports a 2-week history of head congestion, rhinorrhea, postnasal drip, and now has developed severe sinus pain.  She has been using Dymista , Xyzal , Singulair , and Mucinex  without any relief.  She denies any fevers or chills but she does report severe pain in her maxillary sinuses.  Symptoms have been there for more than 2 weeks. Past Medical History:  Diagnosis Date   Cancer (HCC)    breast   GERD (gastroesophageal reflux disease)    Hyperlipidemia    Osteopenia    Osteoporosis    Past Surgical History:  Procedure Laterality Date   BREAST SURGERY N/A    Phreesia 09/18/2019   c-sections x 3     HERNIA REPAIR     MASTECTOMY     Current Outpatient Medications on File Prior to Visit  Medication Sig Dispense Refill   albuterol  (VENTOLIN  HFA) 108 (90 Base) MCG/ACT inhaler Inhale 2 puffs into the lungs every 6 (six) hours as needed for wheezing or shortness of breath. 8 g 0   Azelastine -Fluticasone  137-50 MCG/ACT SUSP Place 1 spray into the nose every 12 (twelve) hours. 23 g 1   b complex vitamins capsule Take 1 capsule by mouth daily.     CALCIUM  600 1500 (600 Ca) MG TABS tablet SMARTSIG:1 By Mouth     D 1000 25 MCG (1000 UT) capsule SMARTSIG:1 By Mouth     ibuprofen  (ADVIL ) 800 MG tablet Take 1 tablet (800 mg total) by mouth every 8 (eight) hours as needed. 60 tablet 2   metroNIDAZOLE (METROCREAM) 0.75 % cream Apply 1 Application topically 2 (two) times daily.     montelukast  (SINGULAIR ) 10 MG tablet Take 1 tablet (10 mg total) by mouth at bedtime. 30 tablet 11   Multiple Vitamin (MULTIVITAMIN) tablet Take 1 tablet by mouth daily.     phenazopyridine  (PYRIDIUM ) 100 MG tablet Take 1 tablet (100 mg total) by mouth 3 (three) times daily as needed for pain (burning). 21 tablet 0   Polyethyl Glycol-Propyl Glycol (SYSTANE OP) Apply to eye as needed. Eye drops     predniSONE   (DELTASONE ) 20 MG tablet 3 tabs poqday 1-2, 2 tabs poqday 3-4, 1 tab poqday 5-6 12 tablet 0   rosuvastatin  (CRESTOR ) 10 MG tablet Take 1 tablet (10 mg total) by mouth daily. 90 tablet 3   sulfamethoxazole -trimethoprim  (BACTRIM  DS) 800-160 MG tablet Take 1 tablet by mouth 2 (two) times daily. 14 tablet 0   No current facility-administered medications on file prior to visit.   Allergies  Allergen Reactions   Penicillins Swelling and Rash    Has patient had a PCN reaction causing immediate rash, facial/tongue/throat swelling, SOB or lightheadedness with hypotension: Yes Has patient had a PCN reaction causing severe rash involving mucus membranes or skin necrosis: Yes Has patient had a PCN reaction that required hospitalization: No Has patient had a PCN reaction occurring within the last 10 years: No If all of the above answers are NO, then may proceed with Cephalosporin use.    Social History   Socioeconomic History   Marital status: Married    Spouse name: Not on file   Number of children: Not on file   Years of education: Not on file   Highest education level: Bachelor's degree (e.g., BA, AB, BS)  Occupational History   Not on file  Tobacco Use   Smoking status:  Never    Passive exposure: Never   Smokeless tobacco: Never  Vaping Use   Vaping status: Never Used  Substance and Sexual Activity   Alcohol use: Yes    Comment: Rare   Drug use: No   Sexual activity: Yes    Comment: married to Ozell.  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  Other Topics Concern   Not on file  Social History Narrative   Not on file   Social Drivers of Health   Financial Resource Strain: Low Risk  (08/26/2023)   Overall Financial Resource Strain (CARDIA)    Difficulty of Paying Living Expenses: Not hard at all  Food Insecurity: No Food Insecurity (08/26/2023)   Hunger Vital Sign    Worried About Running Out of Food in the Last Year: Never true    Ran Out of Food in the Last Year: Never true  Transportation Needs:  No Transportation Needs (08/26/2023)   PRAPARE - Administrator, Civil Service (Medical): No    Lack of Transportation (Non-Medical): No  Physical Activity: Sufficiently Active (08/26/2023)   Exercise Vital Sign    Days of Exercise per Week: 4 days    Minutes of Exercise per Session: 40 min  Stress: No Stress Concern Present (08/26/2023)   [employer] of Occupational Health - Occupational Stress Questionnaire    Feeling of Stress: Not at all  Social Connections: Socially Integrated (08/26/2023)   Social Connection and Isolation Panel    Frequency of Communication with Friends and Family: More than three times a week    Frequency of Social Gatherings with Friends and Family: More than three times a week    Attends Religious Services: More than 4 times per year    Active Member of [employer] or Organizations: Yes    Attends Engineer, structural: More than 4 times per year    Marital Status: Married  Catering manager Violence: Not on file     Review of Systems  All other systems reviewed and are negative.      Objective:   Physical Exam Vitals reviewed.  Constitutional:      Appearance: Normal appearance. She is well-developed and normal weight.  HENT:     Head: Normocephalic and atraumatic.     Right Ear: Tympanic membrane, ear canal and external ear normal.     Left Ear: Tympanic membrane, ear canal and external ear normal.     Nose: Mucosal edema, congestion and rhinorrhea present.     Right Turbinates: Enlarged and swollen.     Left Turbinates: Enlarged and swollen.     Right Sinus: Maxillary sinus tenderness present. No frontal sinus tenderness.     Left Sinus: Maxillary sinus tenderness present. No frontal sinus tenderness.     Mouth/Throat:     Pharynx: No oropharyngeal exudate.  Eyes:     General: Allergic shiner present.  Cardiovascular:     Rate and Rhythm: Normal rate and regular rhythm.     Heart sounds: Normal heart sounds. No murmur heard.     No friction rub. No gallop.  Pulmonary:     Effort: Pulmonary effort is normal. No respiratory distress.     Breath sounds: No wheezing, rhonchi or rales.  Chest:     Chest wall: No tenderness.  Abdominal:     General: Bowel sounds are normal. There is no distension.     Palpations: Abdomen is soft.     Tenderness: There is no abdominal tenderness. There is no guarding or rebound.  Musculoskeletal:     Cervical back: Neck supple.  Neurological:     Mental Status: She is alert.        Assessment & Plan:  Acute bacterial rhinosinusitis Patient has some mild wheezing on her exam.  Use albuterol  2 puffs every 6 hours as needed for wheezing.  Treat the sinus infection with a Z-Pak.  She can use prednisone  taper pack if the wheezing worsens.  Continue, Xyzal , and Singulair  for allergies

## 2023-09-01 ENCOUNTER — Other Ambulatory Visit: Payer: Self-pay | Admitting: Family Medicine

## 2023-09-01 MED ORDER — PREDNISONE 20 MG PO TABS: ORAL_TABLET | ORAL | 0 refills | Status: DC

## 2023-09-14 ENCOUNTER — Ambulatory Visit: Payer: Self-pay

## 2023-09-14 NOTE — Telephone Encounter (Signed)
 FYI Only or Action Required?: Action required by provider: clinical question for provider.  Patient was last seen in primary care on 08/28/2023 by Brittany House DASEN, MD.  Called Nurse Triage reporting Advice Only.  Symptoms began several weeks ago.  Interventions attempted: OTC medications: xyzal  and Prescription medications: nasal spray, albuterol .  Symptoms are: unchanged.  Triage Disposition: See PCP When Office is Open (Within 3 Days) (overriding Call PCP Now)  Patient/caregiver understands and will follow disposition?: Yes     Copied from CRM #[id number]. Topic: Clinical - Red Word Triage >> Sep 14, 2023  9:17 AM Leonette SQUIBB wrote: Red Word that prompted transfer to Nurse Triage: pt called saying she was seen two weeks ago for a cough and it is getting worse, diff breathing Reason for Disposition  [1] Recent medical visit within 24 hours AND [2] condition / symptoms WORSE  Answer Assessment - Initial Assessment Questions Patient had a visit on 08/28/23 with [name]. She says she's been doing everything that he suggested except taking the prednisone . She says [name] only wanted her to take it if she became SOB, which she says she hasn't been. She says she's been taking Xyzal  OTC and using the prescription nasal spray and albuterol  during the day. She says she wonders if she should continue the albuterol . She says she would like to come in to check her lungs because something is going on that she just doesn't feel right in the chest. She says taking deep breaths makes her chest feel heavy, but denies breathing difficulty. Advised first available with [name] is on Thursday 8/14, but she could see another provider today. She says she will stay with [name] for Thursday and asked to be placed on the waiting list to get called for a cancellation. Advised I will send this to [name] for review and if there are any recommendations, someone will call her. She says a call to her  home first, husband's cell 2nd on the list, or send a MyChart message.  1. MAIN CONCERN OR SYMPTOM:  What is your main concern right now? What question do you have? What's the main symptom you're worried about? (e.g., breathing difficulty, cough, fever, pain)     Cough not better, chest feels heavy when taking a deep breath up top 2. ONSET: When did the symptoms start?     Before the visit on 7/25, the way the chest feels the last week 3. BETTER-SAME-WORSE: Are you getting better, staying the same, or getting worse compared to how you felt at your last visit to the doctor (most recent medical visit)?     About the same, but the chest feels different 4. VISIT DATE: When were you seen? (e.g., date)     08/28/23 5. VISIT DOCTOR: What is the name of the doctor taking care of you now?     [name] 6. VISIT DIAGNOSIS:  What was the main symptom or problem that you were seen for? Were you given a diagnosis?      Acute sinus 7. VISIT MEDICINES: Did the doctor order any new medicines for you to use? If Yes, ask: Have you filled the prescription and started taking the medicine?      Prednisone  (did not take per instructions to take for SOB), azithromycin  (completed)  9. PAIN: Is there any pain? If Yes, ask: How bad is it?  (Scale 0-10; or none, mild, moderate, severe)     Can't say it's pain in the  chest, but it's different 10. FEVER: Do you have a fever? If Yes, ask: What is it, how was it measured  and when did it start?       No 11. OTHER SYMPTOMS: Do you have any other symptoms?       Face feels full  Protocols used: Recent Medical Visit for Illness Follow-up Call-[facility]

## 2023-09-17 ENCOUNTER — Encounter: Payer: Self-pay | Admitting: Family Medicine

## 2023-09-17 ENCOUNTER — Ambulatory Visit (INDEPENDENT_AMBULATORY_CARE_PROVIDER_SITE_OTHER): Admitting: Family Medicine

## 2023-09-17 VITALS — BP 120/70 | HR 75 | Temp 98.4°F | Ht 64.3 in | Wt 135.8 lb

## 2023-09-17 DIAGNOSIS — B9689 Other specified bacterial agents as the cause of diseases classified elsewhere: Secondary | ICD-10-CM

## 2023-09-17 DIAGNOSIS — J019 Acute sinusitis, unspecified: Secondary | ICD-10-CM | POA: Diagnosis not present

## 2023-09-17 MED ORDER — CEFDINIR 300 MG PO CAPS: 300.0000 mg | ORAL_CAPSULE | Freq: Two times a day (BID) | ORAL | 0 refills | Status: DC

## 2023-09-17 NOTE — Progress Notes (Signed)
 Subjective:    Patient ID: Brittany House, female    DOB: March 09, 1950, 73 y.o.   MRN: [id number]  Cough   08/28/23 Patient reports a 2-week history of head congestion, rhinorrhea, postnasal drip, and now has developed severe sinus pain.  She has been using Dymista , Xyzal , Singulair , and Mucinex  without any relief.  She denies any fevers or chills but she does report severe pain in her maxillary sinuses.  Symptoms have been there for more than 2 weeks.  At that time, my plan was: Patient has some mild wheezing on her exam.  Use albuterol  2 puffs every 6 hours as needed for wheezing.  Treat the sinus infection with a Z-Pak.  She can use prednisone  taper pack if the wheezing worsens.  Continue, Xyzal , and Singulair  for allergies  09/17/23 Patient's wheezing has resolved.  However she continues to have a cough now for the last 4 weeks.  She continues to have postnasal drip.  She continues to have pain and pressure in her right maxillary and right frontal sinus.  She is feeling better but not 100% better. Past Medical History:  Diagnosis Date   Cancer (HCC)    breast   GERD (gastroesophageal reflux disease)    Hyperlipidemia    Osteopenia    Osteoporosis    Past Surgical History:  Procedure Laterality Date   BREAST SURGERY N/A    Phreesia 09/18/2019   c-sections x 3     HERNIA REPAIR     MASTECTOMY     Current Outpatient Medications on File Prior to Visit  Medication Sig Dispense Refill   albuterol  (VENTOLIN  HFA) 108 (90 Base) MCG/ACT inhaler Inhale 2 puffs into the lungs every 6 (six) hours as needed for wheezing or shortness of breath. 8 g 0   Azelastine -Fluticasone  137-50 MCG/ACT SUSP Place 1 spray into the nose every 12 (twelve) hours. 23 g 1   b complex vitamins capsule Take 1 capsule by mouth daily.     CALCIUM  600 1500 (600 Ca) MG TABS tablet SMARTSIG:1 By Mouth     D 1000 25 MCG (1000 UT) capsule SMARTSIG:1 By Mouth     ibuprofen  (ADVIL ) 800 MG tablet Take 1 tablet (800 mg total)  by mouth every 8 (eight) hours as needed. 60 tablet 2   metroNIDAZOLE (METROCREAM) 0.75 % cream Apply 1 Application topically 2 (two) times daily.     montelukast  (SINGULAIR ) 10 MG tablet Take 1 tablet (10 mg total) by mouth at bedtime. 30 tablet 11   Multiple Vitamin (MULTIVITAMIN) tablet Take 1 tablet by mouth daily.     Polyethyl Glycol-Propyl Glycol (SYSTANE OP) Apply to eye as needed. Eye drops     predniSONE  (DELTASONE ) 20 MG tablet 3 tabs poqday 1-2, 2 tabs poqday 3-4, 1 tab poqday 5-6 12 tablet 0   rosuvastatin  (CRESTOR ) 10 MG tablet Take 1 tablet (10 mg total) by mouth daily. 90 tablet 3   No current facility-administered medications on file prior to visit.   Allergies  Allergen Reactions   Penicillins Swelling and Rash    Has patient had a PCN reaction causing immediate rash, facial/tongue/throat swelling, SOB or lightheadedness with hypotension: Yes Has patient had a PCN reaction causing severe rash involving mucus membranes or skin necrosis: Yes Has patient had a PCN reaction that required hospitalization: No Has patient had a PCN reaction occurring within the last 10 years: No If all of the above answers are NO, then may proceed with Cephalosporin use.    Social  History   Socioeconomic History   Marital status: Married    Spouse name: Not on file   Number of children: Not on file   Years of education: Not on file   Highest education level: Bachelor's degree (e.g., BA, AB, BS)  Occupational History   Not on file  Tobacco Use   Smoking status: Never    Passive exposure: Never   Smokeless tobacco: Never  Vaping Use   Vaping status: Never Used  Substance and Sexual Activity   Alcohol use: Yes    Comment: Rare   Drug use: No   Sexual activity: Yes    Comment: married to [employer].  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  Other Topics Concern   Not on file  Social History Narrative   Not on file   Social Drivers of Health   Financial Resource Strain: Low Risk  (08/26/2023)   Overall  Financial Resource Strain (CARDIA)    Difficulty of Paying Living Expenses: Not hard at all  Food Insecurity: No Food Insecurity (08/26/2023)   Hunger Vital Sign    Worried About Running Out of Food in the Last Year: Never true    Ran Out of Food in the Last Year: Never true  Transportation Needs: No Transportation Needs (08/26/2023)   PRAPARE - Administrator, Civil Service (Medical): No    Lack of Transportation (Non-Medical): No  Physical Activity: Sufficiently Active (08/26/2023)   Exercise Vital Sign    Days of Exercise per Week: 4 days    Minutes of Exercise per Session: 40 min  Stress: No Stress Concern Present (08/26/2023)   [employer] of Occupational Health - Occupational Stress Questionnaire    Feeling of Stress: Not at all  Social Connections: Socially Integrated (08/26/2023)   Social Connection and Isolation Panel    Frequency of Communication with Friends and Family: More than three times a week    Frequency of Social Gatherings with Friends and Family: More than three times a week    Attends Religious Services: More than 4 times per year    Active Member of [employer] or Organizations: Yes    Attends Engineer, structural: More than 4 times per year    Marital Status: Married  Catering manager Violence: Not on file     Review of Systems  Respiratory:  Positive for cough.   All other systems reviewed and are negative.      Objective:   Physical Exam Vitals reviewed.  Constitutional:      Appearance: Normal appearance. She is well-developed and normal weight.  HENT:     Head: Normocephalic and atraumatic.     Right Ear: Tympanic membrane, ear canal and external ear normal.     Left Ear: Tympanic membrane, ear canal and external ear normal.     Nose: Congestion present. No mucosal edema or rhinorrhea.     Right Turbinates: Not enlarged or swollen.     Left Turbinates: Not enlarged or swollen.     Right Sinus: No maxillary sinus tenderness or  frontal sinus tenderness.     Left Sinus: Maxillary sinus tenderness and frontal sinus tenderness present.     Mouth/Throat:     Pharynx: No oropharyngeal exudate.  Eyes:     General: Allergic shiner present.  Cardiovascular:     Rate and Rhythm: Normal rate and regular rhythm.     Heart sounds: Normal heart sounds. No murmur heard.    No friction rub. No gallop.  Pulmonary:  Effort: Pulmonary effort is normal. No respiratory distress.     Breath sounds: No wheezing, rhonchi or rales.  Chest:     Chest wall: No tenderness.  Abdominal:     General: Bowel sounds are normal. There is no distension.     Palpations: Abdomen is soft.     Tenderness: There is no abdominal tenderness. There is no guarding or rebound.  Musculoskeletal:     Cervical back: Neck supple.  Neurological:     Mental Status: She is alert.        Assessment & Plan:  Acute bacterial rhinosinusitis I will switch the patient to Omnicef .  She has a history of a penicillin allergy  there was a rash on her abdomen.  She has no history of anaphylaxis.  She cannot tolerate Levaquin  due to a history of tendon rupture.  Z-Pak was not effective in clearing the infection.  Therefore we both agreed to try the Omnicef  and watch closely for any allergic reaction.

## 2023-10-06 DIAGNOSIS — R208 Other disturbances of skin sensation: Secondary | ICD-10-CM | POA: Diagnosis not present

## 2023-10-06 DIAGNOSIS — L820 Inflamed seborrheic keratosis: Secondary | ICD-10-CM | POA: Diagnosis not present

## 2023-10-06 DIAGNOSIS — L308 Other specified dermatitis: Secondary | ICD-10-CM | POA: Diagnosis not present

## 2023-10-12 DIAGNOSIS — M67911 Unspecified disorder of synovium and tendon, right shoulder: Secondary | ICD-10-CM | POA: Diagnosis not present

## 2023-10-19 DIAGNOSIS — M1712 Unilateral primary osteoarthritis, left knee: Secondary | ICD-10-CM | POA: Diagnosis not present

## 2023-10-19 DIAGNOSIS — M67911 Unspecified disorder of synovium and tendon, right shoulder: Secondary | ICD-10-CM | POA: Diagnosis not present

## 2023-10-21 DIAGNOSIS — M67911 Unspecified disorder of synovium and tendon, right shoulder: Secondary | ICD-10-CM | POA: Diagnosis not present

## 2023-10-21 DIAGNOSIS — M1712 Unilateral primary osteoarthritis, left knee: Secondary | ICD-10-CM | POA: Diagnosis not present

## 2023-10-27 DIAGNOSIS — M67911 Unspecified disorder of synovium and tendon, right shoulder: Secondary | ICD-10-CM | POA: Diagnosis not present

## 2023-10-27 DIAGNOSIS — M1712 Unilateral primary osteoarthritis, left knee: Secondary | ICD-10-CM | POA: Diagnosis not present

## 2023-10-30 DIAGNOSIS — M67911 Unspecified disorder of synovium and tendon, right shoulder: Secondary | ICD-10-CM | POA: Diagnosis not present

## 2023-10-30 DIAGNOSIS — M1712 Unilateral primary osteoarthritis, left knee: Secondary | ICD-10-CM | POA: Diagnosis not present

## 2023-11-01 ENCOUNTER — Other Ambulatory Visit: Payer: Self-pay | Admitting: Family Medicine

## 2023-11-03 DIAGNOSIS — M1712 Unilateral primary osteoarthritis, left knee: Secondary | ICD-10-CM | POA: Diagnosis not present

## 2023-11-03 DIAGNOSIS — M67911 Unspecified disorder of synovium and tendon, right shoulder: Secondary | ICD-10-CM | POA: Diagnosis not present

## 2023-11-03 NOTE — Telephone Encounter (Signed)
 Requested Prescriptions  Pending Prescriptions Disp Refills   Azelastine -Fluticasone  137-50 MCG/ACT SUSP [Pharmacy Med Name: AZELASTIN-FLUTIC 137-50MCG SPR] 23 g 1    Sig: PLACE 1 SPRAY INTO THE NOSE EVERY 12 (TWELVE) HOURS.     Ear, Nose, and Throat: Nasal Preparations - Corticosteroids Passed - 11/03/2023  1:31 PM      Passed - Valid encounter within last 12 months    Recent Outpatient Visits           1 month ago Acute bacterial rhinosinusitis   [facility] Duanne Butler DASEN, MD   2 months ago Acute bacterial rhinosinusitis   [facility] Duanne, Butler DASEN, MD   5 months ago Wheezing   [facility] Duanne, Butler DASEN, MD   5 months ago Dysuria    [facility] Aletha Bene, MD   9 months ago Nontraumatic complete tear of right rotator cuff    [facility] Pickard, Butler DASEN, MD       Future Appointments             In 1 week Pickard, Butler DASEN, MD [facility], [facility]

## 2023-11-05 DIAGNOSIS — M67911 Unspecified disorder of synovium and tendon, right shoulder: Secondary | ICD-10-CM | POA: Diagnosis not present

## 2023-11-05 DIAGNOSIS — M1712 Unilateral primary osteoarthritis, left knee: Secondary | ICD-10-CM | POA: Diagnosis not present

## 2023-11-09 ENCOUNTER — Other Ambulatory Visit

## 2023-11-09 DIAGNOSIS — Z0000 Encounter for general adult medical examination without abnormal findings: Secondary | ICD-10-CM

## 2023-11-09 DIAGNOSIS — J320 Chronic maxillary sinusitis: Secondary | ICD-10-CM

## 2023-11-09 DIAGNOSIS — K5730 Diverticulosis of large intestine without perforation or abscess without bleeding: Secondary | ICD-10-CM | POA: Diagnosis not present

## 2023-11-09 DIAGNOSIS — E785 Hyperlipidemia, unspecified: Secondary | ICD-10-CM | POA: Diagnosis not present

## 2023-11-09 DIAGNOSIS — K219 Gastro-esophageal reflux disease without esophagitis: Secondary | ICD-10-CM

## 2023-11-09 LAB — LIPID PANEL
Cholesterol: 136 mg/dL (ref <200)
HDL: 64 mg/dL (ref >=50)
LDL Cholesterol (Calc): 58 mg/dL
Non-HDL Cholesterol (Calc): 72 mg/dL (ref <130)
Total CHOL/HDL Ratio: 2.1 (calc) (ref <5.0)
Triglycerides: 67 mg/dL (ref <150)

## 2023-11-09 LAB — COMPLETE METABOLIC PANEL WITHOUT GFR
AG Ratio: 2 (calc) (ref 1.0–2.5)
ALT: 24 U/L (ref 6–29)
AST: 22 U/L (ref 10–35)
Albumin: 4.1 g/dL (ref 3.6–5.1)
Alkaline phosphatase (APISO): 58 U/L (ref 37–153)
BUN: 14 mg/dL (ref 7–25)
CO2: 26 mmol/L (ref 20–32)
Calcium: 9 mg/dL (ref 8.6–10.4)
Chloride: 109 mmol/L (ref 98–110)
Creat: 0.84 mg/dL (ref 0.60–1.00)
Globulin: 2.1 g/dL (ref 1.9–3.7)
Glucose, Bld: 93 mg/dL (ref 65–99)
Potassium: 4.1 mmol/L (ref 3.5–5.3)
Sodium: 141 mmol/L (ref 135–146)
Total Bilirubin: 0.4 mg/dL (ref 0.2–1.2)
Total Protein: 6.2 g/dL (ref 6.1–8.1)

## 2023-11-09 LAB — CBC WITH DIFFERENTIAL/PLATELET
Absolute Lymphocytes: 1630 {cells}/uL (ref 850–3900)
Absolute Monocytes: 482 {cells}/uL (ref 200–950)
Basophils Absolute: 30 {cells}/uL (ref 0–200)
Basophils Relative: 0.7 %
Eosinophils Absolute: 142 {cells}/uL (ref 15–500)
Eosinophils Relative: 3.3 %
HCT: 42.7 % (ref 35.0–45.0)
Hemoglobin: 14.5 g/dL (ref 11.7–15.5)
MCH: 32.2 pg (ref 27.0–33.0)
MCHC: 34 g/dL (ref 32.0–36.0)
MCV: 94.9 fL (ref 80.0–100.0)
MPV: 11.2 fL (ref 7.5–12.5)
Monocytes Relative: 11.2 %
Neutro Abs: 2017 {cells}/uL (ref 1500–7800)
Neutrophils Relative %: 46.9 %
Platelets: 212 Thousand/uL (ref 140–400)
RBC: 4.5 Million/uL (ref 3.80–5.10)
RDW: 12.2 % (ref 11.0–15.0)
Total Lymphocyte: 37.9 %
WBC: 4.3 Thousand/uL (ref 3.8–10.8)

## 2023-11-10 ENCOUNTER — Ambulatory Visit: Payer: Self-pay | Admitting: Family Medicine

## 2023-11-10 DIAGNOSIS — S46011D Strain of muscle(s) and tendon(s) of the rotator cuff of right shoulder, subsequent encounter: Secondary | ICD-10-CM | POA: Diagnosis not present

## 2023-11-10 DIAGNOSIS — S46011A Strain of muscle(s) and tendon(s) of the rotator cuff of right shoulder, initial encounter: Secondary | ICD-10-CM | POA: Insufficient documentation

## 2023-11-10 DIAGNOSIS — M25511 Pain in right shoulder: Secondary | ICD-10-CM | POA: Diagnosis not present

## 2023-11-11 ENCOUNTER — Other Ambulatory Visit: Payer: Medicare Other

## 2023-11-11 DIAGNOSIS — M25511 Pain in right shoulder: Secondary | ICD-10-CM | POA: Diagnosis not present

## 2023-11-11 DIAGNOSIS — S46011D Strain of muscle(s) and tendon(s) of the rotator cuff of right shoulder, subsequent encounter: Secondary | ICD-10-CM | POA: Diagnosis not present

## 2023-11-11 DIAGNOSIS — S46011A Strain of muscle(s) and tendon(s) of the rotator cuff of right shoulder, initial encounter: Secondary | ICD-10-CM | POA: Diagnosis not present

## 2023-11-12 DIAGNOSIS — H2513 Age-related nuclear cataract, bilateral: Secondary | ICD-10-CM | POA: Diagnosis not present

## 2023-11-12 DIAGNOSIS — H04123 Dry eye syndrome of bilateral lacrimal glands: Secondary | ICD-10-CM | POA: Diagnosis not present

## 2023-11-12 DIAGNOSIS — H43813 Vitreous degeneration, bilateral: Secondary | ICD-10-CM | POA: Diagnosis not present

## 2023-11-12 DIAGNOSIS — Z83518 Family history of other specified eye disorder: Secondary | ICD-10-CM | POA: Diagnosis not present

## 2023-11-16 ENCOUNTER — Encounter: Payer: Self-pay | Admitting: Family Medicine

## 2023-11-16 ENCOUNTER — Ambulatory Visit: Payer: Medicare Other | Admitting: Family Medicine

## 2023-11-16 VITALS — BP 130/60 | HR 70 | Temp 98.1°F | Ht 64.3 in | Wt 136.0 lb

## 2023-11-16 DIAGNOSIS — Z23 Encounter for immunization: Secondary | ICD-10-CM | POA: Diagnosis not present

## 2023-11-16 DIAGNOSIS — M810 Age-related osteoporosis without current pathological fracture: Secondary | ICD-10-CM | POA: Diagnosis not present

## 2023-11-16 DIAGNOSIS — Z0000 Encounter for general adult medical examination without abnormal findings: Secondary | ICD-10-CM

## 2023-11-16 DIAGNOSIS — R2232 Localized swelling, mass and lump, left upper limb: Secondary | ICD-10-CM | POA: Diagnosis not present

## 2023-11-16 MED ORDER — AZELASTINE-FLUTICASONE 137-50 MCG/ACT NA SUSP: 1.0000 | Freq: Two times a day (BID) | NASAL | 1 refills | Status: DC

## 2023-11-16 MED ORDER — ROSUVASTATIN CALCIUM 10 MG PO TABS: 10.0000 mg | ORAL_TABLET | Freq: Every day | ORAL | 3 refills | Status: AC

## 2023-11-16 NOTE — Progress Notes (Signed)
 Subjective:    Patient ID: Brittany House, female    DOB: [birth year], 73 y.o.   MRN: [id number]  HPI Patient is a very pleasant 73 year old Caucasian female who is here today for complete physical exam.  Had a bone density test in 2024.  This showed osteoporosis.  The patient cannot tolerate bisphosphonates and she declines to take another bisphosphonate or Prolia.  At this point she is treating this with calcium  1200 mg a day and vitamin D  1000 units a day.  She has her mammogram scheduled for December.  Her colonoscopy has already been scheduled for next year.  She is seeing her gynecologist for her pelvic exam.  She is due today for her flu shot.  The remainder of her vaccinations are up-to-date Lab on 11/09/2023  Component Date Value Ref Range Status   WBC 11/09/2023 4.3  3.8 - 10.8 Thousand/uL Final   RBC 11/09/2023 4.50  3.80 - 5.10 Million/uL Final   Hemoglobin 11/09/2023 14.5  11.7 - 15.5 g/dL Final   HCT 89/93/7974 42.7  35.0 - 45.0 % Final   MCV 11/09/2023 94.9  80.0 - 100.0 fL Final   MCH 11/09/2023 32.2  27.0 - 33.0 pg Final   MCHC 11/09/2023 34.0  32.0 - 36.0 g/dL Final   Comment: For adults, a slight decrease in the calculated MCHC value (in the range of 30 to 32 g/dL) is most likely not clinically significant; however, it should be interpreted with caution in correlation with other red cell parameters and the patient's clinical condition.    RDW 11/09/2023 12.2  11.0 - 15.0 % Final   Platelets 11/09/2023 212  140 - 400 Thousand/uL Final   MPV 11/09/2023 11.2  7.5 - 12.5 fL Final   Neutro Abs 11/09/2023 2,017  1,500 - 7,800 cells/uL Final   Absolute Lymphocytes 11/09/2023 1,630  850 - 3,900 cells/uL Final   Absolute Monocytes 11/09/2023 482  200 - 950 cells/uL Final   Eosinophils Absolute 11/09/2023 142  15 - 500 cells/uL Final   Basophils Absolute 11/09/2023 30  0 - 200 cells/uL Final   Neutrophils Relative % 11/09/2023 46.9  % Final   Total Lymphocyte 11/09/2023 37.9  %  Final   Monocytes Relative 11/09/2023 11.2  % Final   Eosinophils Relative 11/09/2023 3.3  % Final   Basophils Relative 11/09/2023 0.7  % Final   Glucose, Bld 11/09/2023 93  65 - 99 mg/dL Final   Comment: .            Fasting reference interval .    BUN 11/09/2023 14  7 - 25 mg/dL Final   Creat 89/93/7974 0.84  0.60 - 1.00 mg/dL Final   BUN/Creatinine Ratio 11/09/2023 SEE NOTE:  6 - 22 (calc) Final   Comment:    Not Reported: BUN and Creatinine are within    reference range. .    Sodium 11/09/2023 141  135 - 146 mmol/L Final   Potassium 11/09/2023 4.1  3.5 - 5.3 mmol/L Final   Chloride 11/09/2023 109  98 - 110 mmol/L Final   CO2 11/09/2023 26  20 - 32 mmol/L Final   Calcium  11/09/2023 9.0  8.6 - 10.4 mg/dL Final   Total Protein 89/93/7974 6.2  6.1 - 8.1 g/dL Final   Albumin 89/93/7974 4.1  3.6 - 5.1 g/dL Final   Globulin 89/93/7974 2.1  1.9 - 3.7 g/dL (calc) Final   AG Ratio 11/09/2023 2.0  1.0 - 2.5 (calc) Final   Total Bilirubin 11/09/2023  0.4  0.2 - 1.2 mg/dL Final   Alkaline phosphatase (APISO) 11/09/2023 58  37 - 153 U/L Final   AST 11/09/2023 22  10 - 35 U/L Final   ALT 11/09/2023 24  6 - 29 U/L Final   Cholesterol 11/09/2023 136  <200 mg/dL Final   HDL 89/93/7974 64  > OR = 50 mg/dL Final   Triglycerides 89/93/7974 67  <150 mg/dL Final   LDL Cholesterol (Calc) 11/09/2023 58  mg/dL (calc) Final   Comment: Reference range: <100 . Desirable range <100 mg/dL for primary prevention;   <70 mg/dL for patients with CHD or diabetic patients  with > or = 2 CHD risk factors. SABRA LDL-C is now calculated using the Martin-Hopkins  calculation, which is a validated novel method providing  better accuracy than the Friedewald equation in the  estimation of LDL-C.  Gladis APPLETHWAITE et al. SANDREA. 7986;689(80): 2061-2068  (http://education.[facility].com/faq/FAQ164)    Total CHOL/HDL Ratio 11/09/2023 2.1  <4.9 (calc) Final   Non-HDL Cholesterol (Calc) 11/09/2023 72  <130 mg/dL (calc)  Final   Comment: For patients with diabetes plus 1 major ASCVD risk  factor, treating to a non-HDL-C goal of <100 mg/dL  (LDL-C of <29 mg/dL) is considered a therapeutic  option.    Of note, she has been dealing with osteoarthritis in the left knee over the medial compartment.  She is seeing orthopedics.  She is receiving viscosupplementation injections and physical therapy.  She also recently injured her right shoulder.  They are performing physical therapy for this as well. Immunization History  Administered Date(s) Administered   Fluad Quad(high Dose 65+) 10/14/2018, 11/03/2019, 12/06/2020   Fluad Trivalent(High Dose 65+) 11/13/2022   INFLUENZA, HIGH DOSE SEASONAL PF 11/16/2023   Influenza Split 12/18/2011   Influenza,inj,Quad PF,6+ Mos 11/16/2012, 12/02/2013, 12/26/2014, 11/29/2015, 11/12/2016, 11/30/2017   Influenza-Unspecified 11/03/2016   PFIZER(Purple Top)SARS-COV-2 Vaccination 03/12/2019, 04/06/2019, 06/23/2020   Pneumococcal Conjugate-13 09/28/2017   Pneumococcal Polysaccharide-23 09/14/2018   Tdap 09/15/2011, 07/20/2020   Zoster Recombinant(Shingrix) 02/23/2018, 11/07/2020   Zoster, Live 07/26/2012       Past Medical History:  Diagnosis Date   Cancer (HCC)    breast   GERD (gastroesophageal reflux disease)    Hyperlipidemia    Osteopenia    Osteoporosis    Past Surgical History:  Procedure Laterality Date   BREAST SURGERY N/A    Phreesia 09/18/2019   c-sections x 3     HERNIA REPAIR     MASTECTOMY     Current Outpatient Medications on File Prior to Visit  Medication Sig Dispense Refill   albuterol  (VENTOLIN  HFA) 108 (90 Base) MCG/ACT inhaler Inhale 2 puffs into the lungs every 6 (six) hours as needed for wheezing or shortness of breath. 8 g 0   b complex vitamins capsule Take 1 capsule by mouth daily.     CALCIUM  600 1500 (600 Ca) MG TABS tablet SMARTSIG:1 By Mouth     D 1000 25 MCG (1000 UT) capsule SMARTSIG:1 By Mouth     ibuprofen  (ADVIL ) 800 MG tablet  Take 1 tablet (800 mg total) by mouth every 8 (eight) hours as needed. 60 tablet 2   metroNIDAZOLE (METROCREAM) 0.75 % cream Apply 1 Application topically 2 (two) times daily.     montelukast  (SINGULAIR ) 10 MG tablet Take 1 tablet (10 mg total) by mouth at bedtime. 30 tablet 11   Multiple Vitamin (MULTIVITAMIN) tablet Take 1 tablet by mouth daily.     Polyethyl Glycol-Propyl Glycol (SYSTANE OP) Apply to  eye as needed. Eye drops     No current facility-administered medications on file prior to visit.   Allergies  Allergen Reactions   Penicillins Swelling and Rash    Has patient had a PCN reaction causing immediate rash, facial/tongue/throat swelling, SOB or lightheadedness with hypotension: Yes Has patient had a PCN reaction causing severe rash involving mucus membranes or skin necrosis: Yes Has patient had a PCN reaction that required hospitalization: No Has patient had a PCN reaction occurring within the last 10 years: No If all of the above answers are NO, then may proceed with Cephalosporin use.    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  Other Topics Concern   Not on file  Social History Narrative   Not on file   Social Drivers of Health   Financial Resource Strain: Low Risk  (08/26/2023)   Overall Financial Resource Strain (CARDIA)    Difficulty of Paying Living Expenses: Not hard at all  Food Insecurity: No Food Insecurity (08/26/2023)   Hunger Vital Sign     Worried About Running Out of Food in the Last Year: Never true    Ran Out of Food in the Last Year: Never true  Transportation Needs: No Transportation Needs (08/26/2023)   PRAPARE - Administrator, Civil Service (Medical): No    Lack of Transportation (Non-Medical): No  Physical Activity: Sufficiently Active (08/26/2023)   Exercise Vital Sign    Days of Exercise per Week: 4 days    Minutes of Exercise per Session: 40 min  Stress: No Stress Concern Present (08/26/2023)   [employer] of Occupational Health - Occupational Stress Questionnaire    Feeling of Stress: Not at all  Social Connections: Socially Integrated (08/26/2023)   Social Connection and Isolation Panel    Frequency of Communication with Friends and Family: More than three times a week    Frequency of Social Gatherings with Friends and Family: More than three times a week    Attends Religious Services: More than 4 times per year    Active Member of [employer] or Organizations: Yes    Attends Engineer, structural: More than 4 times per year    Marital Status: Married  Catering manager Violence: Not on file   Family History  Problem Relation Age of Onset   Food Allergy  Daughter    Allergic rhinitis Son    Heart disease Neg Hx    Cancer Neg Hx       Review of Systems     Objective:   Physical Exam Vitals reviewed.  Constitutional:      General: She is not in acute distress.    Appearance: Normal appearance. She is normal weight. She is not diaphoretic.  HENT:     Head: Normocephalic.     Right Ear: Tympanic membrane, ear canal and external ear  normal.     Left Ear: Tympanic membrane, ear canal and external ear normal.     Nose: Mucosal edema and rhinorrhea present.     Right Nostril: Epistaxis present.     Left Nostril: Epistaxis present.     Left Sinus: Maxillary sinus tenderness and frontal sinus tenderness present.     Mouth/Throat:     Mouth: Mucous membranes are moist.     Pharynx: No  oropharyngeal exudate or posterior oropharyngeal erythema.  Eyes:     General: No scleral icterus.       Right eye: No discharge.        Left eye: No discharge.     Conjunctiva/sclera: Conjunctivae normal.     Pupils: Pupils are equal, round, and reactive to light.  Neck:     Vascular: No carotid bruit.  Cardiovascular:     Rate and Rhythm: Normal rate and regular rhythm.     Pulses: Normal pulses.     Heart sounds: Normal heart sounds. No murmur heard.    No friction rub. No gallop.  Pulmonary:     Effort: Pulmonary effort is normal. No respiratory distress.     Breath sounds: Normal breath sounds. No stridor. No wheezing or rales.  Chest:     Chest wall: No tenderness.  Abdominal:     General: Bowel sounds are normal. There is no distension.     Palpations: Abdomen is soft. There is no mass.     Tenderness: There is no abdominal tenderness. There is no guarding or rebound.  Musculoskeletal:        General: No tenderness. Normal range of motion.     Cervical back: Normal range of motion and neck supple. No rigidity or tenderness.     Right lower leg: No edema.     Left lower leg: No edema.  Lymphadenopathy:     Cervical: No cervical adenopathy.  Skin:    General: Skin is warm.     Coloration: Skin is not jaundiced or pale.     Findings: No bruising, erythema, lesion or rash.  Neurological:     General: No focal deficit present.     Mental Status: She is alert and oriented to person, place, and time. Mental status is at baseline.     Cranial Nerves: No cranial nerve deficit.     Sensory: No sensory deficit.     Motor: No weakness.     Coordination: Coordination normal.     Gait: Gait normal.  Psychiatric:        Behavior: Behavior normal.        Thought Content: Thought content normal.        Judgment: Judgment normal.           Assessment & Plan:  Mass of skin of left thumb - Plan: DG Hand Complete Left  Flu vaccine need - Plan: Flu vaccine HIGH DOSE  PF(Fluzone Trivalent)  General medical exam  Osteoporosis without current pathological fracture, unspecified osteoporosis type On physical exam, at the base of her left thumb at the MCP joint, there is a 3 mm round mass.  I suspect that this is a ganglion cyst.  Begin with an x-ray to rule out any skeletal base neoplasm.  Patient received her flu shot today.  We discussed COVID and RSV.  Colonoscopy and mammogram have already been scheduled.  We again discussed osteoporosis but the patient declines this phosphonate therapy and elects to repeat a bone density test next year.  Blood pressure is excellent.  The remainder of her preventative care is up-to-date

## 2023-11-18 ENCOUNTER — Encounter: Payer: Self-pay | Admitting: Family Medicine

## 2023-11-20 ENCOUNTER — Other Ambulatory Visit: Payer: Self-pay

## 2023-11-20 ENCOUNTER — Other Ambulatory Visit: Payer: Self-pay | Admitting: Family Medicine

## 2023-11-20 ENCOUNTER — Encounter: Payer: Self-pay | Admitting: Family Medicine

## 2023-11-20 ENCOUNTER — Ambulatory Visit
Admission: RE | Admit: 2023-11-20 | Discharge: 2023-11-20 | Disposition: A | Discharge status: Home / Self Care | Source: Ambulatory Visit | Attending: Family Medicine | Admitting: Family Medicine

## 2023-11-20 DIAGNOSIS — M19042 Primary osteoarthritis, left hand: Secondary | ICD-10-CM | POA: Diagnosis not present

## 2023-11-20 DIAGNOSIS — E785 Hyperlipidemia, unspecified: Secondary | ICD-10-CM

## 2023-11-20 DIAGNOSIS — E7800 Pure hypercholesterolemia, unspecified: Secondary | ICD-10-CM

## 2023-11-20 DIAGNOSIS — I700 Atherosclerosis of aorta: Secondary | ICD-10-CM

## 2023-11-20 DIAGNOSIS — R2232 Localized swelling, mass and lump, left upper limb: Secondary | ICD-10-CM

## 2023-11-23 ENCOUNTER — Encounter: Payer: Self-pay | Admitting: Family Medicine

## 2023-11-23 ENCOUNTER — Ambulatory Visit: Payer: Self-pay | Admitting: Family Medicine

## 2023-11-23 NOTE — Addendum Note (Signed)
 Addended by: DUANNE LOWERS T on: 11/23/2023 06:52 AM   Modules accepted: Level of Service

## 2023-11-25 DIAGNOSIS — S46011D Strain of muscle(s) and tendon(s) of the rotator cuff of right shoulder, subsequent encounter: Secondary | ICD-10-CM | POA: Diagnosis not present

## 2023-11-25 DIAGNOSIS — S46011A Strain of muscle(s) and tendon(s) of the rotator cuff of right shoulder, initial encounter: Secondary | ICD-10-CM | POA: Diagnosis not present

## 2023-11-25 DIAGNOSIS — M25511 Pain in right shoulder: Secondary | ICD-10-CM | POA: Diagnosis not present

## 2023-11-27 DIAGNOSIS — S46011A Strain of muscle(s) and tendon(s) of the rotator cuff of right shoulder, initial encounter: Secondary | ICD-10-CM | POA: Diagnosis not present

## 2023-11-27 DIAGNOSIS — S46011D Strain of muscle(s) and tendon(s) of the rotator cuff of right shoulder, subsequent encounter: Secondary | ICD-10-CM | POA: Diagnosis not present

## 2023-11-27 DIAGNOSIS — M25511 Pain in right shoulder: Secondary | ICD-10-CM | POA: Diagnosis not present

## 2023-11-30 ENCOUNTER — Encounter: Payer: Self-pay | Admitting: Family Medicine

## 2023-11-30 ENCOUNTER — Other Ambulatory Visit

## 2023-11-30 DIAGNOSIS — S46011A Strain of muscle(s) and tendon(s) of the rotator cuff of right shoulder, initial encounter: Secondary | ICD-10-CM | POA: Diagnosis not present

## 2023-11-30 DIAGNOSIS — M25511 Pain in right shoulder: Secondary | ICD-10-CM | POA: Diagnosis not present

## 2023-11-30 DIAGNOSIS — S46011D Strain of muscle(s) and tendon(s) of the rotator cuff of right shoulder, subsequent encounter: Secondary | ICD-10-CM | POA: Diagnosis not present

## 2023-12-01 ENCOUNTER — Other Ambulatory Visit

## 2023-12-04 DIAGNOSIS — S46011A Strain of muscle(s) and tendon(s) of the rotator cuff of right shoulder, initial encounter: Secondary | ICD-10-CM | POA: Diagnosis not present

## 2023-12-04 DIAGNOSIS — M25511 Pain in right shoulder: Secondary | ICD-10-CM | POA: Diagnosis not present

## 2023-12-04 DIAGNOSIS — S46011D Strain of muscle(s) and tendon(s) of the rotator cuff of right shoulder, subsequent encounter: Secondary | ICD-10-CM | POA: Diagnosis not present

## 2023-12-07 DIAGNOSIS — S46011D Strain of muscle(s) and tendon(s) of the rotator cuff of right shoulder, subsequent encounter: Secondary | ICD-10-CM | POA: Diagnosis not present

## 2023-12-07 DIAGNOSIS — S46011A Strain of muscle(s) and tendon(s) of the rotator cuff of right shoulder, initial encounter: Secondary | ICD-10-CM | POA: Diagnosis not present

## 2023-12-07 DIAGNOSIS — M25511 Pain in right shoulder: Secondary | ICD-10-CM | POA: Diagnosis not present

## 2023-12-09 ENCOUNTER — Ambulatory Visit (HOSPITAL_COMMUNITY)
Admission: RE | Admit: 2023-12-09 | Discharge: 2023-12-09 | Disposition: A | Discharge status: Home / Self Care | Payer: Self-pay | Source: Ambulatory Visit | Attending: Family Medicine | Admitting: Family Medicine

## 2023-12-09 DIAGNOSIS — E785 Hyperlipidemia, unspecified: Secondary | ICD-10-CM

## 2023-12-10 ENCOUNTER — Ambulatory Visit: Payer: Self-pay | Admitting: Family Medicine

## 2023-12-10 DIAGNOSIS — M25511 Pain in right shoulder: Secondary | ICD-10-CM | POA: Diagnosis not present

## 2023-12-10 DIAGNOSIS — S46011A Strain of muscle(s) and tendon(s) of the rotator cuff of right shoulder, initial encounter: Secondary | ICD-10-CM | POA: Diagnosis not present

## 2023-12-10 DIAGNOSIS — S46011D Strain of muscle(s) and tendon(s) of the rotator cuff of right shoulder, subsequent encounter: Secondary | ICD-10-CM | POA: Diagnosis not present

## 2023-12-11 ENCOUNTER — Encounter: Payer: Self-pay | Admitting: Family Medicine

## 2023-12-14 ENCOUNTER — Other Ambulatory Visit: Payer: Self-pay

## 2023-12-14 DIAGNOSIS — M1712 Unilateral primary osteoarthritis, left knee: Secondary | ICD-10-CM | POA: Insufficient documentation

## 2023-12-14 DIAGNOSIS — M25512 Pain in left shoulder: Secondary | ICD-10-CM | POA: Insufficient documentation

## 2023-12-14 DIAGNOSIS — M170 Bilateral primary osteoarthritis of knee: Secondary | ICD-10-CM | POA: Diagnosis not present

## 2023-12-14 DIAGNOSIS — M25562 Pain in left knee: Secondary | ICD-10-CM | POA: Insufficient documentation

## 2023-12-14 MED ORDER — AZELASTINE-FLUTICASONE 137-50 MCG/ACT NA SUSP: 1.0000 | Freq: Two times a day (BID) | NASAL | 1 refills | Status: AC

## 2023-12-15 DIAGNOSIS — L821 Other seborrheic keratosis: Secondary | ICD-10-CM | POA: Diagnosis not present

## 2023-12-15 DIAGNOSIS — L820 Inflamed seborrheic keratosis: Secondary | ICD-10-CM | POA: Diagnosis not present

## 2023-12-16 DIAGNOSIS — M25511 Pain in right shoulder: Secondary | ICD-10-CM | POA: Diagnosis not present

## 2023-12-16 DIAGNOSIS — S46011A Strain of muscle(s) and tendon(s) of the rotator cuff of right shoulder, initial encounter: Secondary | ICD-10-CM | POA: Diagnosis not present

## 2023-12-16 DIAGNOSIS — S46011D Strain of muscle(s) and tendon(s) of the rotator cuff of right shoulder, subsequent encounter: Secondary | ICD-10-CM | POA: Diagnosis not present

## 2024-01-06 DIAGNOSIS — Z853 Personal history of malignant neoplasm of breast: Secondary | ICD-10-CM | POA: Diagnosis not present

## 2024-01-06 DIAGNOSIS — Z1231 Encounter for screening mammogram for malignant neoplasm of breast: Secondary | ICD-10-CM | POA: Diagnosis not present

## 2024-01-06 DIAGNOSIS — Z01419 Encounter for gynecological examination (general) (routine) without abnormal findings: Secondary | ICD-10-CM | POA: Diagnosis not present

## 2024-01-11 DIAGNOSIS — M25562 Pain in left knee: Secondary | ICD-10-CM | POA: Diagnosis not present

## 2024-01-11 DIAGNOSIS — M1712 Unilateral primary osteoarthritis, left knee: Secondary | ICD-10-CM | POA: Diagnosis not present

## 2024-01-13 ENCOUNTER — Encounter: Payer: Self-pay | Admitting: Family Medicine

## 2024-01-18 ENCOUNTER — Other Ambulatory Visit: Payer: Self-pay

## 2024-01-18 DIAGNOSIS — M810 Age-related osteoporosis without current pathological fracture: Secondary | ICD-10-CM

## 2024-02-11 ENCOUNTER — Telehealth: Payer: Self-pay

## 2024-02-11 ENCOUNTER — Encounter: Payer: Self-pay | Admitting: Family Medicine

## 2024-02-11 NOTE — Telephone Encounter (Signed)
 Copied from CRM #[id number]. Topic: General - Other >> Feb 11, 2024 12:40 PM Wess RAMAN wrote: Reason for CRM: Patient would like a call back from Dr. Clara nurse to discuss a personal matter.   Callback #: [phone] or [phone]

## 2024-02-12 ENCOUNTER — Ambulatory Visit: Admitting: Family Medicine

## 2024-02-12 VITALS — BP 118/68 | HR 70 | Temp 97.5°F | Ht 64.3 in | Wt 134.0 lb

## 2024-02-12 DIAGNOSIS — R300 Dysuria: Secondary | ICD-10-CM | POA: Diagnosis not present

## 2024-02-12 LAB — URINALYSIS, ROUTINE W REFLEX MICROSCOPIC
Bilirubin Urine: NEGATIVE
Glucose, UA: NEGATIVE
Hgb urine dipstick: NEGATIVE
Ketones, ur: NEGATIVE
Leukocytes,Ua: NEGATIVE
Nitrite: NEGATIVE
Protein, ur: NEGATIVE
Specific Gravity, Urine: 1.025 (ref 1.001–1.035)
pH: 5.5 (ref 5.0–8.0)

## 2024-02-12 MED ORDER — SULFAMETHOXAZOLE-TRIMETHOPRIM 800-160 MG PO TABS: 1.0000 | ORAL_TABLET | Freq: Two times a day (BID) | ORAL | 0 refills | Status: AC

## 2024-02-12 NOTE — Progress Notes (Signed)
 "  Subjective:    Patient ID: Brittany House, female    DOB: [birth year], 74 y.o.   MRN: [id number]  Dysuria   Patient states she may be coming down with a bladder infection.  She states she has some burning discomfort in her bladder.  She denies any dysuria or urgency or frequency.  She has been pushing water trying to flush her system.  Urinalysis today is negative for ketones negative for blood negative for nitrates but negative for leukocyte esterase.  The urine is clear in color. Past Medical History:  Diagnosis Date   Cancer (HCC)    breast   GERD (gastroesophageal reflux disease)    Hyperlipidemia    Osteopenia    Osteoporosis    Past Surgical History:  Procedure Laterality Date   BREAST SURGERY N/A    [employer] 09/18/2019   c-sections x 3     HERNIA REPAIR     MASTECTOMY     Current Outpatient Medications on File Prior to Visit  Medication Sig Dispense Refill   albuterol  (VENTOLIN  HFA) 108 (90 Base) MCG/ACT inhaler Inhale 2 puffs into the lungs every 6 (six) hours as needed for wheezing or shortness of breath. 8 g 0   Azelastine -Fluticasone  137-50 MCG/ACT SUSP Place 1 spray into the nose every 12 (twelve) hours. 23 g 1   b complex vitamins capsule Take 1 capsule by mouth daily.     CALCIUM  600 1500 (600 Ca) MG TABS tablet SMARTSIG:1 By Mouth     D 1000 25 MCG (1000 UT) capsule SMARTSIG:1 By Mouth     ibuprofen  (ADVIL ) 800 MG tablet Take 1 tablet (800 mg total) by mouth every 8 (eight) hours as needed. 60 tablet 2   metroNIDAZOLE (METROCREAM) 0.75 % cream Apply 1 Application topically 2 (two) times daily.     montelukast  (SINGULAIR ) 10 MG tablet Take 1 tablet (10 mg total) by mouth at bedtime. 30 tablet 11   Multiple Vitamin (MULTIVITAMIN) tablet Take 1 tablet by mouth daily.     Polyethyl Glycol-Propyl Glycol (SYSTANE OP) Apply to eye as needed. Eye drops     rosuvastatin  (CRESTOR ) 10 MG tablet Take 1 tablet (10 mg total) by mouth daily. 90 tablet 3   No current  facility-administered medications on file prior to visit.   Allergies  Allergen Reactions   Grass Pollen(K-O-[id number]-T-Swt Vern) Other (See Comments)   Penicillins Swelling and Rash    Has patient had a PCN reaction causing immediate rash, facial/tongue/throat swelling, SOB or lightheadedness with hypotension: Yes Has patient had a PCN reaction causing severe rash involving mucus membranes or skin necrosis: Yes Has patient had a PCN reaction that required hospitalization: No Has patient had a PCN reaction occurring within the last 10 years: No If all of the above answers are NO, then may proceed with Cephalosporin use.    Social History   Socioeconomic History   Marital status: Married    Spouse name: Not on file   Number of children: Not on file   Years of education: Not on file   Highest education level: Bachelor's degree (e.g., BA, AB, BS)  Occupational History   Not on file  Tobacco Use   Smoking status: Never    Passive exposure: Never   Smokeless tobacco: Never  Vaping Use   Vaping status: Never Used  Substance and Sexual Activity   Alcohol use: Yes    Comment: Rare   Drug use: No   Sexual activity: Yes    Comment:  married to Ozell.  School runner, broadcasting/film/video.  Other Topics Concern   Not on file  Social History Narrative   Not on file   Social Drivers of Health   Tobacco Use: Low Risk (11/16/2023)   Patient History    Smoking Tobacco Use: Never    Smokeless Tobacco Use: Never    Passive Exposure: Never  Financial Resource Strain: Low Risk (08/26/2023)   Overall Financial Resource Strain (CARDIA)    Difficulty of Paying Living Expenses: Not hard at all  Food Insecurity: No Food Insecurity (08/26/2023)   Epic    Worried About Radiation Protection Practitioner of Food in the Last Year: Never true    Ran Out of Food in the Last Year: Never true  Transportation Needs: No Transportation Needs (08/26/2023)   Epic    Lack of Transportation (Medical): No    Lack of Transportation (Non-Medical): No   Physical Activity: Sufficiently Active (08/26/2023)   Exercise Vital Sign    Days of Exercise per Week: 4 days    Minutes of Exercise per Session: 40 min  Stress: No Stress Concern Present (08/26/2023)   [employer] of Occupational Health - Occupational Stress Questionnaire    Feeling of Stress: Not at all  Social Connections: Socially Integrated (08/26/2023)   Social Connection and Isolation Panel    Frequency of Communication with Friends and Family: More than three times a week    Frequency of Social Gatherings with Friends and Family: More than three times a week    Attends Religious Services: More than 4 times per year    Active Member of [employer] or Organizations: Yes    Attends Banker Meetings: More than 4 times per year    Marital Status: Married  Catering Manager Violence: Not on file  Depression (PHQ2-9): Low Risk (11/16/2023)   Depression (PHQ2-9)    PHQ-2 Score: 0  Alcohol Screen: Not on file  Housing: Unknown (08/26/2023)   Epic    Unable to Pay for Housing in the Last Year: No    Number of Times Moved in the Last Year: Not on file    Homeless in the Last Year: No  Utilities: Not on file  Health Literacy: Not on file     Review of Systems  Genitourinary:  Positive for dysuria.  All other systems reviewed and are negative.      Objective:   Physical Exam Vitals reviewed.  Constitutional:      Appearance: Normal appearance. She is well-developed and normal weight.  HENT:     Head: Normocephalic and atraumatic.     Nose: Congestion and rhinorrhea present.     Mouth/Throat:     Pharynx: No oropharyngeal exudate.  Cardiovascular:     Rate and Rhythm: Normal rate and regular rhythm.     Heart sounds: Normal heart sounds. No murmur heard.    No friction rub. No gallop.  Pulmonary:     Effort: Pulmonary effort is normal. No respiratory distress.     Breath sounds: No wheezing, rhonchi or rales.  Chest:     Chest wall: No tenderness.   Abdominal:     General: Bowel sounds are normal. There is no distension.     Palpations: Abdomen is soft.     Tenderness: There is no abdominal tenderness. There is no guarding or rebound.  Musculoskeletal:     Cervical back: Neck supple.  Neurological:     Mental Status: She is alert.  Assessment & Plan:  Dysuria - Plan: Urinalysis, Routine w reflex microscopic Urinalysis does not suggest a urinary tract infection although it may be diluted.  Recommend the patient push fluids over the weekend.  If the dysuria worsens I did give her a prescription for Bactrim .  I will send a urine culture to confirm or deny urinary tract infection.  Recommended avoiding caffeine, tomato sauce, and acidic foods which could also irritate the bladder "

## 2024-02-13 LAB — URINE CULTURE
MICRO NUMBER:: 17449322
SPECIMEN QUALITY:: ADEQUATE

## 2024-02-15 ENCOUNTER — Ambulatory Visit: Payer: Self-pay | Admitting: Family Medicine

## 2025-01-03 ENCOUNTER — Other Ambulatory Visit (HOSPITAL_BASED_OUTPATIENT_CLINIC_OR_DEPARTMENT_OTHER)
